# Patient Record
Sex: Female | Born: 1985 | Race: Black or African American | Hispanic: No | Marital: Single | Smoking: Current some day smoker
Health system: Southern US, Community
[De-identification: ages and names within clinical notes are randomized; demographics above are authoritative.]

## PROBLEM LIST (undated history)

## (undated) ENCOUNTER — Inpatient Hospital Stay (HOSPITAL_COMMUNITY): Payer: Self-pay

## (undated) DIAGNOSIS — A599 Trichomoniasis, unspecified: Secondary | ICD-10-CM

## (undated) DIAGNOSIS — F329 Major depressive disorder, single episode, unspecified: Secondary | ICD-10-CM

## (undated) DIAGNOSIS — F32A Depression, unspecified: Secondary | ICD-10-CM

## (undated) DIAGNOSIS — R768 Other specified abnormal immunological findings in serum: Secondary | ICD-10-CM

## (undated) DIAGNOSIS — K219 Gastro-esophageal reflux disease without esophagitis: Secondary | ICD-10-CM

## (undated) HISTORY — PX: WISDOM TOOTH EXTRACTION: SHX21

## (undated) HISTORY — DX: Other specified abnormal immunological findings in serum: R76.8

---
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
## 2006-10-07 ENCOUNTER — Emergency Department (HOSPITAL_COMMUNITY): Admission: EM | Admit: 2006-10-07 | Discharge: 2006-10-07 | Payer: Self-pay | Admitting: Emergency Medicine

## 2007-05-30 ENCOUNTER — Emergency Department (HOSPITAL_COMMUNITY): Admission: EM | Admit: 2007-05-30 | Discharge: 2007-05-30 | Payer: Self-pay | Admitting: Emergency Medicine

## 2007-07-10 ENCOUNTER — Inpatient Hospital Stay (HOSPITAL_COMMUNITY): Admission: AD | Admit: 2007-07-10 | Discharge: 2007-07-10 | Payer: Self-pay | Admitting: Obstetrics

## 2007-08-21 ENCOUNTER — Inpatient Hospital Stay (HOSPITAL_COMMUNITY): Admission: AD | Admit: 2007-08-21 | Discharge: 2007-08-23 | Payer: Self-pay | Admitting: Obstetrics

## 2009-01-04 ENCOUNTER — Inpatient Hospital Stay (HOSPITAL_COMMUNITY): Admission: RE | Admit: 2009-01-04 | Discharge: 2009-01-06 | Payer: Self-pay | Admitting: Obstetrics

## 2010-11-27 LAB — CBC
HCT: 33.1 % — ABNORMAL LOW (ref 36.0–46.0)
Hemoglobin: 10.5 g/dL — ABNORMAL LOW (ref 12.0–15.0)
MCHC: 34.2 g/dL (ref 30.0–36.0)
MCHC: 34.7 g/dL (ref 30.0–36.0)
MCV: 95.9 fL (ref 78.0–100.0)
Platelets: 199 10*3/uL (ref 150–400)
RBC: 3.16 MIL/uL — ABNORMAL LOW (ref 3.87–5.11)
RDW: 13.4 % (ref 11.5–15.5)
RDW: 13.8 % (ref 11.5–15.5)
WBC: 13.3 10*3/uL — ABNORMAL HIGH (ref 4.0–10.5)

## 2011-05-08 LAB — CBC
HCT: 32.3 — ABNORMAL LOW
Hemoglobin: 10 — ABNORMAL LOW
MCV: 91.9
RBC: 3.13 — ABNORMAL LOW
RBC: 3.51 — ABNORMAL LOW
RDW: 12.9
WBC: 14.1 — ABNORMAL HIGH

## 2011-05-08 LAB — RPR: RPR Ser Ql: NONREACTIVE

## 2011-06-17 ENCOUNTER — Emergency Department (HOSPITAL_COMMUNITY)
Admission: EM | Admit: 2011-06-17 | Discharge: 2011-06-17 | Disposition: A | Discharge status: Home / Self Care | Payer: Medicaid Other | Attending: Emergency Medicine | Admitting: Emergency Medicine

## 2011-06-17 ENCOUNTER — Emergency Department (HOSPITAL_COMMUNITY): Payer: Medicaid Other

## 2011-06-17 DIAGNOSIS — W540XXA Bitten by dog, initial encounter: Secondary | ICD-10-CM | POA: Insufficient documentation

## 2011-06-17 DIAGNOSIS — S81009A Unspecified open wound, unspecified knee, initial encounter: Secondary | ICD-10-CM | POA: Insufficient documentation

## 2011-06-20 ENCOUNTER — Inpatient Hospital Stay (INDEPENDENT_AMBULATORY_CARE_PROVIDER_SITE_OTHER)
Admission: RE | Admit: 2011-06-20 | Discharge: 2011-06-20 | Disposition: A | Discharge status: Home / Self Care | Payer: Medicaid Other | Source: Ambulatory Visit | Attending: Family Medicine | Admitting: Family Medicine

## 2011-06-20 DIAGNOSIS — Z23 Encounter for immunization: Secondary | ICD-10-CM

## 2011-06-24 ENCOUNTER — Encounter: Payer: Self-pay | Admitting: *Deleted

## 2011-06-24 ENCOUNTER — Emergency Department (INDEPENDENT_AMBULATORY_CARE_PROVIDER_SITE_OTHER)
Admission: EM | Admit: 2011-06-24 | Discharge: 2011-06-24 | Disposition: A | Discharge status: Home / Self Care | Payer: Medicaid Other | Source: Home / Self Care

## 2011-06-24 DIAGNOSIS — T148XXA Other injury of unspecified body region, initial encounter: Secondary | ICD-10-CM

## 2011-06-24 DIAGNOSIS — W540XXA Bitten by dog, initial encounter: Secondary | ICD-10-CM

## 2011-06-24 MED ORDER — CLINDAMYCIN HCL 300 MG PO CAPS: 300.0000 mg | ORAL_CAPSULE | Freq: Four times a day (QID) | ORAL | Status: AC

## 2011-06-24 MED ORDER — RABIES VACCINE, PCEC IM SUSR
INTRAMUSCULAR | Status: AC
  Administered 2011-06-24: 1 mL via INTRAMUSCULAR
  Filled 2011-06-24: qty 1

## 2011-06-24 MED ORDER — RABIES VACCINE, PCEC IM SUSR
1.0000 mL | Freq: Once | INTRAMUSCULAR | Status: AC
  Administered 2011-06-24: 1 mL via INTRAMUSCULAR

## 2011-06-24 NOTE — ED Notes (Signed)
Pt. Said she has missed taking her Augmentin for 3 days.  She took 2 yesterday and one today. I asked Dr. Juanetta Gosling to see the pt.

## 2011-06-24 NOTE — ED Provider Notes (Signed)
History     CSN: [id number] Arrival date & time: No admission date for patient encounter.   None     Chief Complaint  Patient presents with  . Rabies Injection    (Consider location/radiation/quality/duration/timing/severity/associated sxs/prior treatment) Patient is a 25 y.o. female presenting with animal bite. The history is provided by the patient.  Animal Bite  The incident occurred at home. There is an injury to the left lower leg.    History reviewed. No pertinent past medical history.  History reviewed. No pertinent past surgical history.  History reviewed. No pertinent family history.  History  Substance Use Topics  . Smoking status: Current Everyday Smoker -- 0.2 packs/day  . Smokeless tobacco: Not on file  . Alcohol Use: No    OB History    Grav Para Term Preterm Abortions TAB SAB Ect Mult Living                  Review of Systems  Constitutional: Negative.   HENT: Negative.   Eyes: Negative.   Respiratory: Negative.   Cardiovascular: Negative.   Gastrointestinal: Negative.   Genitourinary: Negative.   Skin: Positive for color change and wound.  Neurological: Negative.     Allergies  Review of patient's allergies indicates no known allergies.  Home Medications   Current Outpatient Rx  Name Route Sig Dispense Refill  . AMOXICILLIN-POT CLAVULANATE 875-125 MG PO TABS Oral Take 1 tablet by mouth 2 (two) times daily.        BP 118/68  Pulse 85  Temp(Src) 98.6 F (37 C) (Oral)  Resp 24  SpO2 100%  Physical Exam  Constitutional: She appears well-developed and well-nourished.  HENT:  Head: Normocephalic and atraumatic.  Eyes: EOM are normal. Pupils are equal, round, and reactive to light.  Skin: Laceration noted. There is erythema.          2cm x 2 cm open wound with stitches in place over lateral LEFT lower leg, with 5 cm diameter of surrounding erythema and induration    ED Course  Procedures (including critical care time)  Labs  Reviewed - No data to display No results found.   No diagnosis found.    MDM  Increased erythema around wound, worse from 2 days ago (per Nurse York); will add coverage        Richardo Priest, MD 06/24/11 1530

## 2011-06-27 ENCOUNTER — Encounter (HOSPITAL_COMMUNITY): Payer: Self-pay | Admitting: Emergency Medicine

## 2011-06-27 ENCOUNTER — Emergency Department (INDEPENDENT_AMBULATORY_CARE_PROVIDER_SITE_OTHER)
Admission: EM | Admit: 2011-06-27 | Discharge: 2011-06-27 | Disposition: A | Discharge status: Home / Self Care | Payer: Medicaid Other | Source: Home / Self Care | Attending: Family Medicine | Admitting: Family Medicine

## 2011-06-27 DIAGNOSIS — T148XXA Other injury of unspecified body region, initial encounter: Secondary | ICD-10-CM

## 2011-06-27 DIAGNOSIS — T148 Other injury of unspecified body region: Secondary | ICD-10-CM

## 2011-06-27 NOTE — ED Provider Notes (Signed)
History     CSN: [id number] Arrival date & time: 06/27/2011  2:31 PM   First MD Initiated Contact with Patient 06/27/11 1358      Chief Complaint  Patient presents with  . Wound Check    (Consider location/radiation/quality/duration/timing/severity/associated sxs/prior treatment) Patient is a 25 y.o. female presenting with wound check. The history is provided by the patient.  Wound Check  She was treated in the ED 5 to 10 days ago. Previous treatment in the ED includes laceration repair and wound cleansing or irrigation. Treatments since wound repair include oral antibiotics, regular soap and water washings and a wound recheck. There has been bloody discharge from the wound. The redness has not changed. The swelling has not changed. The pain has not changed.    History reviewed. No pertinent past medical history.  History reviewed. No pertinent past surgical history.  No family history on file.  History  Substance Use Topics  . Smoking status: Current Everyday Smoker -- 0.2 packs/day  . Smokeless tobacco: Not on file  . Alcohol Use: No    OB History    Grav Para Term Preterm Abortions TAB SAB Ect Mult Living                  Review of Systems  Constitutional: Negative.   HENT: Negative.   Eyes: Negative.   Respiratory: Negative.   Cardiovascular: Negative.   Musculoskeletal: Negative.   Skin: Positive for wound.    Allergies  Review of patient's allergies indicates no known allergies.  Home Medications   Current Outpatient Rx  Name Route Sig Dispense Refill  . AMOXICILLIN-POT CLAVULANATE 875-125 MG PO TABS Oral Take 1 tablet by mouth 2 (two) times daily.      Marland Kitchen CLINDAMYCIN HCL 300 MG PO CAPS Oral Take 1 capsule (300 mg total) by mouth every 6 (six) hours. 28 capsule 0    BP 130/77  Pulse 94  Temp(Src) 98.6 F (37 C) (Oral)  Resp 16  Physical Exam  Skin:       ED Course  Procedures (including critical care time)  Labs Reviewed - No data to  display No results found.   No diagnosis found.    MDM  Wound rechecked        Richardo Priest, MD 06/27/11 1635

## 2011-06-27 NOTE — ED Notes (Signed)
Pt comes in for f/u dog bite wound to left lower leg that occurred 06/24/11.pt has received tetanus shot here.c/o min drainage @ sututred incision and 2nd area wound intact with sutures and steri strips.pain with pressure and upon awaking feels like aching pain.denies numb/tingling.

## 2011-07-02 ENCOUNTER — Emergency Department (HOSPITAL_COMMUNITY)
Admission: EM | Admit: 2011-07-02 | Discharge: 2011-07-02 | Disposition: A | Discharge status: Home / Self Care | Payer: Medicaid Other | Source: Home / Self Care | Attending: Family Medicine | Admitting: Family Medicine

## 2011-07-02 ENCOUNTER — Encounter (HOSPITAL_COMMUNITY): Payer: Self-pay | Admitting: *Deleted

## 2011-07-02 MED ORDER — RABIES VACCINE, PCEC IM SUSR
INTRAMUSCULAR | Status: AC
  Filled 2011-07-02: qty 1

## 2011-07-02 MED ORDER — RABIES VACCINE, PCEC IM SUSR
1.0000 mL | Freq: Once | INTRAMUSCULAR | Status: AC
  Administered 2011-07-02: 1 mL via INTRAMUSCULAR

## 2011-07-02 NOTE — ED Notes (Signed)
Removed 3 sutures from lateral left leg ,also removed 1 stitch inner left leg

## 2011-07-02 NOTE — ED Notes (Signed)
Here for last rabies vaccine for dog bite L lower leg. States she needs her stitches taken out. No previous problems with injections.  Wound checked by Dr. Juanetta Gosling and he approved sutures to be removed.

## 2014-05-11 ENCOUNTER — Encounter (HOSPITAL_COMMUNITY): Payer: Self-pay

## 2014-05-11 ENCOUNTER — Inpatient Hospital Stay (HOSPITAL_COMMUNITY)
Admission: AD | Admit: 2014-05-11 | Discharge: 2014-05-11 | Disposition: A | Discharge status: Home / Self Care | Payer: Medicaid Other | Source: Ambulatory Visit | Attending: Family Medicine | Admitting: Family Medicine

## 2014-05-11 DIAGNOSIS — F17200 Nicotine dependence, unspecified, uncomplicated: Secondary | ICD-10-CM | POA: Diagnosis not present

## 2014-05-11 DIAGNOSIS — O98819 Other maternal infectious and parasitic diseases complicating pregnancy, unspecified trimester: Secondary | ICD-10-CM | POA: Diagnosis not present

## 2014-05-11 DIAGNOSIS — N888 Other specified noninflammatory disorders of cervix uteri: Secondary | ICD-10-CM

## 2014-05-11 DIAGNOSIS — A5901 Trichomonal vulvovaginitis: Secondary | ICD-10-CM | POA: Diagnosis not present

## 2014-05-11 DIAGNOSIS — N898 Other specified noninflammatory disorders of vagina: Secondary | ICD-10-CM | POA: Diagnosis present

## 2014-05-11 HISTORY — DX: Major depressive disorder, single episode, unspecified: F32.9

## 2014-05-11 HISTORY — DX: Depression, unspecified: F32.A

## 2014-05-11 LAB — URINE MICROSCOPIC-ADD ON

## 2014-05-11 LAB — URINALYSIS, ROUTINE W REFLEX MICROSCOPIC
BILIRUBIN URINE: NEGATIVE
Glucose, UA: NEGATIVE mg/dL
KETONES UR: NEGATIVE mg/dL
NITRITE: NEGATIVE
PH: 6 (ref 5.0–8.0)
PROTEIN: NEGATIVE mg/dL
Specific Gravity, Urine: 1.02 (ref 1.005–1.030)
Urobilinogen, UA: 0.2 mg/dL (ref 0.0–1.0)

## 2014-05-11 LAB — WET PREP, GENITAL
Clue Cells Wet Prep HPF POC: NONE SEEN
Yeast Wet Prep HPF POC: NONE SEEN

## 2014-05-11 LAB — HIV ANTIBODY (ROUTINE TESTING W REFLEX): HIV: NONREACTIVE

## 2014-05-11 LAB — ABO/RH: ABO/RH(D): O POS

## 2014-05-11 MED ORDER — METRONIDAZOLE 500 MG PO TABS
2000.0000 mg | ORAL_TABLET | Freq: Once | ORAL | Status: AC
  Administered 2014-05-11: 2000 mg via ORAL
  Filled 2014-05-11: qty 4

## 2014-05-11 NOTE — MAU Provider Note (Signed)
History     CSN: [id number]  Arrival date and time: 05/11/14 1452   First Provider Initiated Contact with Patient 05/11/14 1543      Chief Complaint  Patient presents with  . Possible Pregnancy  . Vaginal Discharge  . Abdominal Pain   HPI  [name] is a 28 y.o. female G5P4004 at [redacted]w[redacted]d who presents with vaginal discharge that is pink. The discharge has been going on for 4 days, she feels like the symptoms are getting worse.  She has not started prenatal care and plans to start with Dr. Gaynell Face next week.   OB History   Grav Para Term Preterm Abortions TAB SAB Ect Mult Living   0 0 0 0 0 0 4      Past Medical History  Diagnosis Date  . Depression     ok now    Past Surgical History  Procedure Laterality Date  . Wisdom tooth extraction      Family History  Problem Relation Age of Onset  . Hypertension Mother   . Cancer Maternal Aunt     breast  . Hearing loss Neg Hx     History  Substance Use Topics  . Smoking status: Current Every Day Smoker -- 0.25 packs/day for 10 years    Types: Cigarettes  . Smokeless tobacco: Never Used  . Alcohol Use: No    Allergies: No Known Allergies  No prescriptions prior to admission   No results found for this or any previous visit (from the past 48 hour(s)).  Results for orders placed during the hospital encounter of 05/11/14 (from the past 48 hour(s))  URINALYSIS, ROUTINE W REFLEX MICROSCOPIC     Status: Abnormal   Collection Time    05/11/14  3:10 PM      Result Value Ref Range   Color, Urine YELLOW  YELLOW   APPearance CLEAR  CLEAR   Specific Gravity, Urine 1.020  1.005 - 1.030   pH 6.0  5.0 - 8.0   Glucose, UA NEGATIVE  NEGATIVE mg/dL   Hgb urine dipstick LARGE (*) NEGATIVE   Bilirubin Urine NEGATIVE  NEGATIVE   Ketones, ur NEGATIVE  NEGATIVE mg/dL   Protein, ur NEGATIVE  NEGATIVE mg/dL   Urobilinogen, UA 0.2  0.0 - 1.0 mg/dL   Nitrite NEGATIVE  NEGATIVE   Leukocytes, UA LARGE (*) NEGATIVE   URINE MICROSCOPIC-ADD ON     Status: Abnormal   Collection Time    05/11/14  3:10 PM      Result Value Ref Range   Squamous Epithelial / LPF MANY (*) RARE   WBC, UA 7-10  <3 WBC/hpf   RBC / HPF 7-10  <3 RBC/hpf   Bacteria, UA MANY (*) RARE   Urine-Other TRICHOMONAS PRESENT    WET PREP, GENITAL     Status: Abnormal   Collection Time    05/11/14  3:55 PM      Result Value Ref Range   Yeast Wet Prep HPF POC NONE SEEN  NONE SEEN   Trich, Wet Prep FEW (*) NONE SEEN   Clue Cells Wet Prep HPF POC NONE SEEN  NONE SEEN   WBC, Wet Prep HPF POC MODERATE (*) NONE SEEN   Comment: MANY BACTERIA SEEN  ABO/RH     Status: None   Collection Time    05/11/14  4:12 PM      Result Value Ref Range   ABO/RH(D) O POS     Review of  Systems  Constitutional: Negative for fever and chills.  Gastrointestinal: Positive for abdominal pain (Lower abdomianl pain, sharp pain at times. ). Negative for nausea and vomiting.  Genitourinary: Negative for dysuria, urgency, frequency and hematuria.       No vaginal discharge; pink discharge  No vaginal bleeding. No dysuria.    Physical Exam   Blood pressure 117/53, pulse 100, temperature 99 F (37.2 C), temperature source Oral, resp. rate 16, height  (1.575 m), weight 73.029 kg (161 lb), last menstrual period 01/29/2014, SpO2 100.00%.  Physical Exam  Constitutional: She appears well-developed and well-nourished. No distress.  HENT:  Head: Normocephalic.  Eyes: Pupils are equal, round, and reactive to light.  Neck: Neck supple.  GI: Soft. Normal appearance. There is tenderness in the suprapubic area. There is no rigidity, no rebound and no guarding.  Genitourinary: Vaginal discharge found.  Speculum exam: Vagina - moderate amount of creamy, pink discharge, strong odor Cervix - + contact bleeding, cervix appears friable  Bimanual exam: Cervix closed, no CMT  Uterus non tender, enlarged  Adnexa non tender, no masses bilaterally, suprapubic tenderness   GC/Chlam, wet prep done Chaperone present for exam.   Musculoskeletal: Normal range of motion.  Neurological: She is alert.  Skin: Skin is warm. She is not diaphoretic.  Psychiatric: Her behavior is normal.    MAU Course  Procedures None  MDM +fht  Wet prep GC Patient has an appointment scheduled with Dr. Gaynell Face next week. Pt is encouraged to come back to MAU if symptoms worsen  Bleeding coming directly from friable cervix; +trichomonas  Assessment and Plan   A: 1. Friable cervix   2. Trichomonas vaginitis    P: Discharge home in stable condition  Patient treated in MAU for trichomonas; partner needs treatment and no intercourse for 7 days Bleeding precautions    Iona Hansen Dafney Farler, NP  05/11/2014, 3:45 PM

## 2014-05-11 NOTE — MAU Note (Signed)
Patient states she has had a positive home pregnancy test. States she has had a pinkish vaginal discharge and abdominal pain for a couple of days. Denies nausea or vomiting.

## 2014-05-11 NOTE — MAU Note (Signed)
Pink spotting started 3 days ago, still pink but seems to be getting worse.  Pain in lower abd starteda bout the same time

## 2014-05-12 LAB — CULTURE, OB URINE: SPECIAL REQUESTS: NORMAL

## 2014-05-12 LAB — GC/CHLAMYDIA PROBE AMP
CT PROBE, AMP APTIMA: NEGATIVE
GC PROBE AMP APTIMA: NEGATIVE

## 2014-05-12 NOTE — MAU Provider Note (Signed)
Attestation of Attending Supervision of Advanced Practitioner (PA/CNM/NP): Evaluation and management procedures were performed by the Advanced Practitioner under my supervision and collaboration.  I have reviewed the Advanced Practitioner's note and chart, and I agree with the management and plan.  Bonniejean Piano, DO Attending Physician Faculty Practice, [facility]  

## 2014-05-18 ENCOUNTER — Other Ambulatory Visit (HOSPITAL_COMMUNITY): Payer: Self-pay | Admitting: Obstetrics

## 2014-05-18 ENCOUNTER — Ambulatory Visit (HOSPITAL_COMMUNITY)
Admission: RE | Admit: 2014-05-18 | Discharge: 2014-05-18 | Disposition: A | Discharge status: Home / Self Care | Payer: Medicaid Other | Source: Ambulatory Visit | Attending: Obstetrics | Admitting: Obstetrics

## 2014-05-18 DIAGNOSIS — O368390 Maternal care for abnormalities of the fetal heart rate or rhythm, unspecified trimester, not applicable or unspecified: Secondary | ICD-10-CM | POA: Insufficient documentation

## 2014-05-18 DIAGNOSIS — O021 Missed abortion: Secondary | ICD-10-CM | POA: Diagnosis not present

## 2014-05-18 DIAGNOSIS — IMO0002 Reserved for concepts with insufficient information to code with codable children: Secondary | ICD-10-CM

## 2014-05-24 ENCOUNTER — Inpatient Hospital Stay (HOSPITAL_COMMUNITY)
Admission: AD | Admit: 2014-05-24 | Discharge: 2014-05-25 | DRG: 779 | Disposition: A | Discharge status: Home / Self Care | Payer: Medicaid Other | Source: Ambulatory Visit | Attending: Obstetrics | Admitting: Obstetrics

## 2014-05-24 ENCOUNTER — Encounter (HOSPITAL_COMMUNITY): Payer: Self-pay | Admitting: *Deleted

## 2014-05-24 DIAGNOSIS — O021 Missed abortion: Principal | ICD-10-CM | POA: Diagnosis present

## 2014-05-24 DIAGNOSIS — IMO0002 Reserved for concepts with insufficient information to code with codable children: Secondary | ICD-10-CM | POA: Diagnosis present

## 2014-05-24 LAB — TYPE AND SCREEN
ABO/RH(D): O POS
Antibody Screen: NEGATIVE

## 2014-05-24 LAB — CBC
HCT: 34.1 % — ABNORMAL LOW (ref 36.0–46.0)
HEMOGLOBIN: 11.5 g/dL — AB (ref 12.0–15.0)
MCH: 29.6 pg (ref 26.0–34.0)
MCHC: 33.7 g/dL (ref 30.0–36.0)
MCV: 87.7 fL (ref 78.0–100.0)
PLATELETS: 340 10*3/uL (ref 150–400)
RBC: 3.89 MIL/uL (ref 3.87–5.11)
RDW: 14.9 % (ref 11.5–15.5)
WBC: 10.1 10*3/uL (ref 4.0–10.5)

## 2014-05-24 LAB — MRSA PCR SCREENING: MRSA BY PCR: NEGATIVE

## 2014-05-24 LAB — HIV ANTIBODY (ROUTINE TESTING W REFLEX): HIV 1&2 Ab, 4th Generation: NONREACTIVE

## 2014-05-24 LAB — RPR

## 2014-05-24 LAB — HEPATITIS B SURFACE ANTIGEN: Hepatitis B Surface Ag: NEGATIVE

## 2014-05-24 MED ORDER — IBUPROFEN 600 MG PO TABS
600.0000 mg | ORAL_TABLET | Freq: Four times a day (QID) | ORAL | Status: DC
  Administered 2014-05-25 (×2): 600 mg via ORAL
  Filled 2014-05-24 (×3): qty 1

## 2014-05-24 MED ORDER — OXYCODONE-ACETAMINOPHEN 5-325 MG PO TABS: 2.0000 | ORAL_TABLET | ORAL | Status: DC | PRN

## 2014-05-24 MED ORDER — ONDANSETRON HCL 4 MG/2ML IJ SOLN
4.0000 mg | Freq: Four times a day (QID) | INTRAMUSCULAR | Status: DC | PRN
  Administered 2014-05-24: 4 mg via INTRAVENOUS
  Filled 2014-05-24: qty 2

## 2014-05-24 MED ORDER — BENZOCAINE-MENTHOL 20-0.5 % EX AERO: 1.0000 "application " | INHALATION_SPRAY | CUTANEOUS | Status: DC | PRN

## 2014-05-24 MED ORDER — ONDANSETRON HCL 4 MG/2ML IJ SOLN
4.0000 mg | INTRAMUSCULAR | Status: DC | PRN
  Administered 2014-05-24: 4 mg via INTRAVENOUS
  Filled 2014-05-24: qty 2

## 2014-05-24 MED ORDER — MISOPROSTOL 200 MCG PO TABS
ORAL_TABLET | ORAL | Status: AC
  Filled 2014-05-24: qty 3

## 2014-05-24 MED ORDER — SENNOSIDES-DOCUSATE SODIUM 8.6-50 MG PO TABS
2.0000 | ORAL_TABLET | ORAL | Status: DC
  Filled 2014-05-24: qty 2

## 2014-05-24 MED ORDER — TETANUS-DIPHTH-ACELL PERTUSSIS 5-2.5-18.5 LF-MCG/0.5 IM SUSP
0.5000 mL | Freq: Once | INTRAMUSCULAR | Status: DC
  Filled 2014-05-24: qty 0.5

## 2014-05-24 MED ORDER — LACTATED RINGERS IV SOLN
INTRAVENOUS | Status: DC
  Administered 2014-05-24 (×2): via INTRAVENOUS

## 2014-05-24 MED ORDER — ZOLPIDEM TARTRATE 5 MG PO TABS: 5.0000 mg | ORAL_TABLET | Freq: Every evening | ORAL | Status: DC | PRN

## 2014-05-24 MED ORDER — DIBUCAINE 1 % RE OINT: 1.0000 "application " | TOPICAL_OINTMENT | RECTAL | Status: DC | PRN

## 2014-05-24 MED ORDER — DIPHENHYDRAMINE HCL 50 MG/ML IJ SOLN: 12.5000 mg | Freq: Four times a day (QID) | INTRAMUSCULAR | Status: DC | PRN

## 2014-05-24 MED ORDER — LANOLIN HYDROUS EX OINT: TOPICAL_OINTMENT | CUTANEOUS | Status: DC | PRN

## 2014-05-24 MED ORDER — ONDANSETRON HCL 4 MG PO TABS
4.0000 mg | ORAL_TABLET | ORAL | Status: DC | PRN
Start: 2014-05-24 — End: 2014-05-25

## 2014-05-24 MED ORDER — DIPHENHYDRAMINE HCL 25 MG PO CAPS: 25.0000 mg | ORAL_CAPSULE | Freq: Four times a day (QID) | ORAL | Status: DC | PRN

## 2014-05-24 MED ORDER — HYDROMORPHONE 0.3 MG/ML IV SOLN
INTRAVENOUS | Status: DC
  Administered 2014-05-24: 12:00:00 via INTRAVENOUS
  Filled 2014-05-24: qty 25

## 2014-05-24 MED ORDER — PRENATAL MULTIVITAMIN CH: 1.0000 | ORAL_TABLET | Freq: Every day | ORAL | Status: DC

## 2014-05-24 MED ORDER — OXYCODONE-ACETAMINOPHEN 5-325 MG PO TABS: 1.0000 | ORAL_TABLET | ORAL | Status: DC | PRN

## 2014-05-24 MED ORDER — FERROUS SULFATE 325 (65 FE) MG PO TABS: 325.0000 mg | ORAL_TABLET | Freq: Two times a day (BID) | ORAL | Status: DC

## 2014-05-24 MED ORDER — NALOXONE HCL 0.4 MG/ML IJ SOLN: 0.4000 mg | INTRAMUSCULAR | Status: DC | PRN

## 2014-05-24 MED ORDER — SIMETHICONE 80 MG PO CHEW: 80.0000 mg | CHEWABLE_TABLET | ORAL | Status: DC | PRN

## 2014-05-24 MED ORDER — OXYTOCIN 40 UNITS IN LACTATED RINGERS INFUSION - SIMPLE MED
INTRAVENOUS | Status: AC
  Administered 2014-05-24: 40 [IU]
  Filled 2014-05-24: qty 1000

## 2014-05-24 MED ORDER — MISOPROSTOL 200 MCG PO TABS: 600.0000 ug | ORAL_TABLET | Freq: Four times a day (QID) | ORAL | Status: DC

## 2014-05-24 MED ORDER — SODIUM CHLORIDE 0.9 % IJ SOLN
9.0000 mL | INTRAMUSCULAR | Status: DC | PRN
Start: 2014-05-24 — End: 2014-05-24

## 2014-05-24 MED ORDER — MISOPROSTOL 200 MCG PO TABS
600.0000 ug | ORAL_TABLET | Freq: Once | ORAL | Status: AC
  Administered 2014-05-24: 600 ug via VAGINAL
  Filled 2014-05-24: qty 3

## 2014-05-24 MED ORDER — WITCH HAZEL-GLYCERIN EX PADS: 1.0000 "application " | MEDICATED_PAD | CUTANEOUS | Status: DC | PRN

## 2014-05-24 MED ORDER — DIPHENHYDRAMINE HCL 12.5 MG/5ML PO ELIX
12.5000 mg | ORAL_SOLUTION | Freq: Four times a day (QID) | ORAL | Status: DC | PRN
  Filled 2014-05-24: qty 5

## 2014-05-24 NOTE — Progress Notes (Signed)
UR chart review completed.  

## 2014-05-24 NOTE — Progress Notes (Signed)
Patient ID: Cindy Cruz, female   DOB: 12-15-1985, 28 y.o.   MRN: [id number] Patient  Passed about 300cc of clots and the small  female fetus patient was placed on a bedpan  Upside down  speculum inserted and using sponge forceps the placenta was removed intact her bleeding is now minimal second trimester AB because of a  Fetal demise at 15 weeks

## 2014-05-24 NOTE — Progress Notes (Signed)
1845:  Delivery of large clot and estimated 11-14 week fetus with no signs of life confirming Ultrasound findings from 9/30.

## 2014-05-24 NOTE — H&P (Signed)
This is Dr. Francoise CeoBernard Cruz dictating the history and physical on  Cindy Cruz she is a 28 year old gravida 5 para 4004 at 15+ weeks EDC 11/05/1957 who was seen in the obtuse on 9:30 start prenatal care her uterus was 16 weeks size there was no fetal heart and IUFD was confirmed by ultrasound she was seen yesterday and laminae are inserted and the cervix and today she was admitted Cindy Cruz removed from the cervix then Cytotec 600 mics was inserted in the vagina past medical history negative Past surgical history negative Social history negative System review negative Physical exam well-developed female in no distress  HEENT negative Breasts negative Heart regular rhythm no murmurs no gallops Lungs clear to P&A Abdomen uterus 16 weeks size Pelvic as described above Extremities negative

## 2014-05-24 NOTE — Progress Notes (Signed)
05/24/14 1300  Clinical Encounter Type  Visited With Patient  Visit Type Spiritual support;Social support  Referral From Nurse  Spiritual Encounters  Spiritual Needs Grief support;Emotional  Stress Factors  Patient Stress Factors Loss of control;Loss   Made initial visit to offer bereavement support.  Sheng was welcoming of pastoral presence and shared about her shock of learning of baby's death after having four healthy pregnancies.  She is grieving the loss of this much-wanted baby, even as she looks ahead toward trying again.  Visit interrupted by emesis, pt's desire for nap.  She is aware of ongoing chaplain availability.  Please note that chaplain may be traveling from off campus, so there may be a 20-minute lag time between phone and in-person responses to page.  Thank you.  [address] AvenueChaplain Solaris Kram Doe RunLundeen, [place]iv [phone]

## 2014-05-25 LAB — CBC
HCT: 25.9 % — ABNORMAL LOW (ref 36.0–46.0)
Hemoglobin: 8.6 g/dL — ABNORMAL LOW (ref 12.0–15.0)
MCH: 28.9 pg (ref 26.0–34.0)
MCHC: 33.2 g/dL (ref 30.0–36.0)
MCV: 86.9 fL (ref 78.0–100.0)
Platelets: 276 10*3/uL (ref 150–400)
RBC: 2.98 MIL/uL — ABNORMAL LOW (ref 3.87–5.11)
RDW: 14.5 % (ref 11.5–15.5)
WBC: 12.1 10*3/uL — ABNORMAL HIGH (ref 4.0–10.5)

## 2014-05-25 LAB — RUBELLA SCREEN: Rubella: 0.84 Index (ref <0.90)

## 2014-05-25 NOTE — Progress Notes (Signed)
UR chart review completed.  

## 2014-05-25 NOTE — Progress Notes (Signed)
PT given discharge instructions. PT verbalized understanding and had no further questions. PT discharged home @ 0850 in stable condition.

## 2014-05-25 NOTE — Discharge Summary (Signed)
  Patient is a 28 year old gravida 5 para 304004 who was seen last week at 15 weeks for her first prenatal visit that time was noted to have a fetal demise confirmed by ultrasound she was brought in yesterday had Cytotec placed    Spontaneous vaginal delivery of a macerated little  female and the placenta was removed from the uterus with sponge forceps she's done well him but discharge today to see me in 6 weeks

## 2014-05-25 NOTE — Progress Notes (Signed)
Patient ID: Cindy Cruz, female   DOB: Jun 21, 1986, 28 y.o.   MRN: [id number] Vital signs normal Fundus firm Lochia moderate Legs negative Home today on Percocet for pain

## 2014-05-25 NOTE — Discharge Instructions (Signed)
if  bleeding more than a period notify me  °No sex for 3 weeks °See me in 3 weeks  °Resume normal activity in am °

## 2014-06-20 ENCOUNTER — Encounter (HOSPITAL_COMMUNITY): Payer: Self-pay | Admitting: *Deleted

## 2014-08-09 ENCOUNTER — Emergency Department (INDEPENDENT_AMBULATORY_CARE_PROVIDER_SITE_OTHER)
Admission: EM | Admit: 2014-08-09 | Discharge: 2014-08-09 | Disposition: A | Discharge status: Home / Self Care | Payer: Medicaid Other | Source: Home / Self Care | Attending: Family Medicine | Admitting: Family Medicine

## 2014-08-09 ENCOUNTER — Other Ambulatory Visit (HOSPITAL_COMMUNITY)
Admission: RE | Admit: 2014-08-09 | Discharge: 2014-08-09 | Disposition: A | Discharge status: Home / Self Care | Payer: Medicaid Other | Source: Ambulatory Visit | Attending: Family Medicine | Admitting: Family Medicine

## 2014-08-09 ENCOUNTER — Encounter (HOSPITAL_COMMUNITY): Payer: Self-pay | Admitting: *Deleted

## 2014-08-09 DIAGNOSIS — N760 Acute vaginitis: Secondary | ICD-10-CM | POA: Insufficient documentation

## 2014-08-09 DIAGNOSIS — Z113 Encounter for screening for infections with a predominantly sexual mode of transmission: Secondary | ICD-10-CM | POA: Insufficient documentation

## 2014-08-09 LAB — POCT URINALYSIS DIP (DEVICE)
Bilirubin Urine: NEGATIVE
GLUCOSE, UA: NEGATIVE mg/dL
Hgb urine dipstick: NEGATIVE
KETONES UR: NEGATIVE mg/dL
Leukocytes, UA: NEGATIVE
Nitrite: NEGATIVE
Protein, ur: NEGATIVE mg/dL
Urobilinogen, UA: 0.2 mg/dL (ref 0.0–1.0)
pH: 5.5 (ref 5.0–8.0)

## 2014-08-09 LAB — POCT PREGNANCY, URINE: PREG TEST UR: NEGATIVE

## 2014-08-09 MED ORDER — METRONIDAZOLE 500 MG PO TABS: 1000.0000 mg | ORAL_TABLET | Freq: Two times a day (BID) | ORAL | Status: DC

## 2014-08-09 NOTE — ED Notes (Addendum)
Had unprotected sex with last partner.  Just wants to be checked to make sure she does not have anything.  [facility]ich.  She was treated and told her partner to get treated,but does not know if he did.  No symptoms.

## 2014-08-09 NOTE — ED Provider Notes (Signed)
CSN: [id number]     Arrival date & time 08/09/14  1640 History   First MD Initiated Contact with Patient 08/09/14 1727     Chief Complaint  Patient presents with  . Exposure to STD   (Consider location/radiation/quality/duration/timing/severity/associated sxs/prior Treatment) HPI            28 year old female presents requesting STD testing. She tested positive for trichomonas couple of months ago. She told her boyfriend to be tested and treated as well but she is not sure if he did. She has not experienced any new symptoms but she will just wants to check to make sure she has gotten rid of the infection. She denies any abdominal pain, discharge, dysuria, or any other symptoms.  Past Medical History  Diagnosis Date  . Depression     ok now   Past Surgical History  Procedure Laterality Date  . Wisdom tooth extraction     Family History  Problem Relation Age of Onset  . Hypertension Mother   . Cancer Maternal Aunt     breast  . Hearing loss Neg Hx    History  Substance Use Topics  . Smoking status: Current Every Day Smoker -- 1.00 packs/day for 10 years    Types: Cigarettes  . Smokeless tobacco: Never Used  . Alcohol Use: No   OB History    Gravida Para Term Preterm AB TAB SAB Ectopic Multiple Living   5 4 4  0 0 0 0 0 0 4     Review of Systems  Constitutional: Negative for fever and chills.  Eyes: Negative for visual disturbance.  Respiratory: Negative for cough and shortness of breath.   Cardiovascular: Negative for chest pain, palpitations and leg swelling.  Gastrointestinal: Negative for nausea, vomiting and abdominal pain.  Endocrine: Negative for polydipsia and polyuria.  Genitourinary: Negative for dysuria, urgency and frequency.  Musculoskeletal: Negative for myalgias and arthralgias.  Skin: Negative for rash.  Neurological: Negative for dizziness, weakness and light-headedness.  All other systems reviewed and are negative.   Allergies  Review of patient's  allergies indicates no known allergies.  Home Medications   Prior to Admission medications   Medication Sig Start Date End Date Taking? Authorizing Provider  Prenatal Multivit-Min-Fe-FA (PRENATAL VITAMINS PO) Take 1 tablet by mouth daily.   Yes Historical Provider, MD  metroNIDAZOLE (FLAGYL) 500 MG tablet Take 2 tablets (1,000 mg total) by mouth every 12 (twelve) hours. 08/09/14   Adrian BlackwaterZachary H Keyairra Kolinski, PA-C   BP 102/68 mmHg  Pulse 76  Temp(Src) 98.5 F (36.9 C) (Oral)  Resp 16  SpO2 98%  LMP 08/01/2014  Breastfeeding? Unknown Physical Exam  Constitutional: She is oriented to person, place, and time. Vital signs are normal. She appears well-developed and well-nourished. No distress.  HENT:  Head: Normocephalic and atraumatic.  Pulmonary/Chest: Effort normal. No respiratory distress.  Abdominal: Soft. She exhibits no mass. There is no tenderness.  Genitourinary: There is no lesion on the right labia. There is no lesion on the left labia. Cervix exhibits no discharge and no friability. No erythema or bleeding in the vagina. Vaginal discharge (thin, white) found.  Lymphadenopathy:       Right: No inguinal adenopathy present.       Left: No inguinal adenopathy present.  Neurological: She is alert and oriented to person, place, and time. She has normal strength. Coordination normal.  Skin: Skin is warm and dry. No rash noted. She is not diaphoretic.  Psychiatric: She has a normal mood and  affect. Judgment normal.  Nursing note and vitals reviewed.   ED Course  Procedures (including critical care time) Labs Review Labs Reviewed  POCT URINALYSIS DIP (DEVICE)  POCT PREGNANCY, URINE  CERVICOVAGINAL ANCILLARY ONLY    Imaging Review No results found.   MDM   1. Vaginitis    Vaginal cytology swabs obtained. I provided her a prescription for metronidazole, she will take it if we call her and tell her the test is positive.   Meds ordered this encounter  Medications  . Prenatal  Multivit-Min-Fe-FA (PRENATAL VITAMINS PO)    Sig: Take 1 tablet by mouth daily.  . metroNIDAZOLE (FLAGYL) 500 MG tablet    Sig: Take 2 tablets (1,000 mg total) by mouth every 12 (twelve) hours.    Dispense:  4 tablet    Refill:  0       Graylon GoodZachary H Avaleen Brownley, PA-C 08/09/14 [phone]

## 2014-08-09 NOTE — Discharge Instructions (Signed)
Vaginitis °Vaginitis is an inflammation of the vagina. It is most often caused by a change in the normal balance of the bacteria and yeast that live in the vagina. This change in balance causes an overgrowth of certain bacteria or yeast, which causes the inflammation. There are different types of vaginitis, but the most common types are: °· Bacterial vaginosis. °· Yeast infection (candidiasis). °· Trichomoniasis vaginitis. This is a sexually transmitted infection (STI). °· Viral vaginitis. °· Atropic vaginitis. °· Allergic vaginitis. °CAUSES  °The cause depends on the type of vaginitis. Vaginitis can be caused by: °· Bacteria (bacterial vaginosis). °· Yeast (yeast infection). °· A parasite (trichomoniasis vaginitis) °· A virus (viral vaginitis). °· Low hormone levels (atrophic vaginitis). Low hormone levels can occur during pregnancy, breastfeeding, or after menopause. °· Irritants, such as bubble baths, scented tampons, and feminine sprays (allergic vaginitis). °Other factors can change the normal balance of the yeast and bacteria that live in the vagina. These include: °· Antibiotic medicines. °· Poor hygiene. °· Diaphragms, vaginal sponges, spermicides, birth control pills, and intrauterine devices (IUD). °· Sexual intercourse. °· Infection. °· Uncontrolled diabetes. °· A weakened immune system. °SYMPTOMS  °Symptoms can vary depending on the cause of the vaginitis. Common symptoms include: °· Abnormal vaginal discharge. °· The discharge is white, gray, or yellow with bacterial vaginosis. °· The discharge is thick, white, and cheesy with a yeast infection. °· The discharge is frothy and yellow or greenish with trichomoniasis. °· A bad vaginal odor. °· The odor is fishy with bacterial vaginosis. °· Vaginal itching, pain, or swelling. °· Painful intercourse. °· Pain or burning when urinating. °Sometimes, there are no symptoms. °TREATMENT  °Treatment will vary depending on the type of infection.  °· Bacterial  vaginosis and trichomoniasis are often treated with antibiotic creams or pills. °· Yeast infections are often treated with antifungal medicines, such as vaginal creams or suppositories. °· Viral vaginitis has no cure, but symptoms can be treated with medicines that relieve discomfort. Your sexual partner should be treated as well. °· Atrophic vaginitis may be treated with an estrogen cream, pill, suppository, or vaginal ring. If vaginal dryness occurs, lubricants and moisturizing creams may help. You may be told to avoid scented soaps, sprays, or douches. °· Allergic vaginitis treatment involves quitting the use of the product that is causing the problem. Vaginal creams can be used to treat the symptoms. °HOME CARE INSTRUCTIONS  °· Take all medicines as directed by your caregiver. °· Keep your genital area clean and dry. Avoid soap and only rinse the area with water. °· Avoid douching. It can remove the healthy bacteria in the vagina. °· Do not use tampons or have sexual intercourse until your vaginitis has been treated. Use sanitary pads while you have vaginitis. °· Wipe from front to back. This avoids the spread of bacteria from the rectum to the vagina. °· Let air reach your genital area. °¨ Wear cotton underwear to decrease moisture buildup. °¨ Avoid wearing underwear while you sleep until your vaginitis is gone. °¨ Avoid tight pants and underwear or nylons without a cotton panel. °¨ Take off wet clothing (especially bathing suits) as soon as possible. °· Use mild, non-scented products. Avoid using irritants, such as: °¨ Scented feminine sprays. °¨ Fabric softeners. °¨ Scented detergents. °¨ Scented tampons. °¨ Scented soaps or bubble baths. °· Practice safe sex and use condoms. Condoms may prevent the spread of trichomoniasis and viral vaginitis. °SEEK MEDICAL CARE IF:  °· You have abdominal pain. °· You   have a fever or persistent symptoms for more than 2-3 days. °· You have a fever and your symptoms suddenly  get worse. °Document Released: 06/02/2007 Document Revised: 04/29/2012 Document Reviewed: 01/16/2012 °ExitCare® Patient Information ©2015 [employer]. This information is not intended to replace advice given to you by your health care provider. Make sure you discuss any questions you have with your health care provider. ° °Sexually Transmitted Disease °A sexually transmitted disease (STD) is a disease or infection that may be passed (transmitted) from person to person, usually during sexual activity. This may happen by way of saliva, semen, blood, vaginal mucus, or urine. Common STDs include:  °· Gonorrhea.   °· Chlamydia.   °· Syphilis.   °· HIV and AIDS.   °· Genital herpes.   °· Hepatitis B and C.   °· Trichomonas.   °· Human papillomavirus (HPV).   °· Pubic lice.   °· Scabies. °· Mites. °· Bacterial vaginosis. °WHAT ARE CAUSES OF STDs? °An STD may be caused by bacteria, a virus, or parasites. STDs are often transmitted during sexual activity if one person is infected. However, they may also be transmitted through nonsexual means. STDs may be transmitted after:  °· Sexual intercourse with an infected person.   °· Sharing sex toys with an infected person.   °· Sharing needles with an infected person or using unclean piercing or tattoo needles. °· Having intimate contact with the genitals, mouth, or rectal areas of an infected person.   °· Exposure to infected fluids during birth. °WHAT ARE THE SIGNS AND SYMPTOMS OF STDs? °Different STDs have different symptoms. Some people may not have any symptoms. If symptoms are present, they may include:  °· Painful or bloody urination.   °· Pain in the pelvis, abdomen, vagina, anus, throat, or eyes.   °· A skin rash, itching, or irritation. °· Growths, ulcerations, blisters, or sores in the genital and anal areas. °· Abnormal vaginal discharge with or without bad odor.   °· Penile discharge in men.   °· Fever.   °· Pain or bleeding during sexual intercourse.   °· Swollen  glands in the groin area.   °· Yellow skin and eyes (jaundice). This is seen with hepatitis.   °· Swollen testicles. °· Infertility. °· Sores and blisters in the mouth. °HOW ARE STDs DIAGNOSED? °To make a diagnosis, your health care provider may:  °· Take a medical history.   °· Perform a physical exam.   °· Take a sample of any discharge to examine. °· Swab the throat, cervix, opening to the penis, rectum, or vagina for testing. °· Test a sample of your first morning urine.   °· Perform blood tests.   °· Perform a Pap test, if this applies.   °· Perform a colposcopy.   °· Perform a laparoscopy.   °HOW ARE STDs TREATED? ° Treatment depends on the STD. Some STDs may be treated but not cured.  °· Chlamydia, gonorrhea, trichomonas, and syphilis can be cured with antibiotic medicine.   °· Genital herpes, hepatitis, and HIV can be treated, but not cured, with prescribed medicines. The medicines lessen symptoms.   °· Genital warts from HPV can be treated with medicine or by freezing, burning (electrocautery), or surgery. Warts may come back.   °· HPV cannot be cured with medicine or surgery. However, abnormal areas may be removed from the cervix, vagina, or vulva.   °· If your diagnosis is confirmed, your recent sexual partners need treatment. This is true even if they are symptom-free or have a negative culture or evaluation. They should not have sex until their health care providers say it is okay. °HOW CAN I REDUCE MY   RISK OF GETTING AN STD? °Take these steps to reduce your risk of getting an STD: °· Use latex condoms, dental dams, and water-soluble lubricants during sexual activity. Do not use petroleum jelly or oils. °· Avoid having multiple sex partners. °· Do not have sex with someone who has other sex partners. °· Do not have sex with anyone you do not know or who is at high risk for an STD. °· Avoid risky sex practices that can break your skin. °· Do not have sex if you have open sores on your mouth or  skin. °· Avoid drinking too much alcohol or taking illegal drugs. Alcohol and drugs can affect your judgment and put you in a vulnerable position. °· Avoid engaging in oral and anal sex acts. °· Get vaccinated for HPV and hepatitis. If you have not received these vaccines in the past, talk to your health care provider about whether one or both might be right for you.   °· If you are at risk of being infected with HIV, it is recommended that you take a prescription medicine daily to prevent HIV infection. This is called pre-exposure prophylaxis (PrEP). You are considered at risk if: °¨ You are a man who has sex with other men (MSM). °¨ You are a heterosexual man or woman and are sexually active with more than one partner. °¨ You take drugs by injection. °¨ You are sexually active with a partner who has HIV. °· Talk with your health care provider about whether you are at high risk of being infected with HIV. If you choose to begin PrEP, you should first be tested for HIV. You should then be tested every 3 months for as long as you are taking PrEP.   °WHAT SHOULD I DO IF I THINK I HAVE AN STD? °· See your health care provider.   °· Tell your sexual partner(s). They should be tested and treated for any STDs. °· Do not have sex until your health care provider says it is okay.  °WHEN SHOULD I GET IMMEDIATE MEDICAL CARE? °Contact your health care provider right away if:  °· You have severe abdominal pain. °· You are a man and notice swelling or pain in your testicles. °· You are a woman and notice swelling or pain in your vagina. °Document Released: 10/26/2002 Document Revised: 08/10/2013 Document Reviewed: 02/23/2013 °ExitCare® Patient Information ©2015 [employer]. This information is not intended to replace advice given to you by your health care provider. Make sure you discuss any questions you have with your health care provider. ° °

## 2014-08-10 LAB — CERVICOVAGINAL ANCILLARY ONLY
Chlamydia: NEGATIVE
Neisseria Gonorrhea: NEGATIVE

## 2014-08-11 LAB — CERVICOVAGINAL ANCILLARY ONLY
WET PREP (BD AFFIRM): NEGATIVE
WET PREP (BD AFFIRM): POSITIVE — AB
Wet Prep (BD Affirm): NEGATIVE

## 2014-08-11 NOTE — ED Notes (Signed)
GC/Chlamydia neg., Affirm: Candida and Trich neg., Gardnerella pos. Pt. adequately treated with Flagyl. Cindy Cruz, Chyanna Flock M 08/11/2014

## 2014-12-01 ENCOUNTER — Emergency Department (HOSPITAL_COMMUNITY)
Admission: EM | Admit: 2014-12-01 | Discharge: 2014-12-01 | Disposition: A | Discharge status: Home / Self Care | Payer: Medicaid Other | Attending: Emergency Medicine | Admitting: Emergency Medicine

## 2014-12-01 ENCOUNTER — Encounter (HOSPITAL_COMMUNITY): Payer: Self-pay

## 2014-12-01 DIAGNOSIS — L299 Pruritus, unspecified: Secondary | ICD-10-CM

## 2014-12-01 DIAGNOSIS — Z720 Tobacco use: Secondary | ICD-10-CM | POA: Diagnosis not present

## 2014-12-01 DIAGNOSIS — Z79899 Other long term (current) drug therapy: Secondary | ICD-10-CM | POA: Insufficient documentation

## 2014-12-01 DIAGNOSIS — Z8659 Personal history of other mental and behavioral disorders: Secondary | ICD-10-CM | POA: Diagnosis not present

## 2014-12-01 MED ORDER — DIPHENHYDRAMINE HCL 25 MG PO CAPS
25.0000 mg | ORAL_CAPSULE | Freq: Once | ORAL | Status: AC
  Administered 2014-12-01: 25 mg via ORAL
  Filled 2014-12-01: qty 1

## 2014-12-01 MED ORDER — PREDNISONE 20 MG PO TABS
60.0000 mg | ORAL_TABLET | Freq: Once | ORAL | Status: AC
  Administered 2014-12-01: 60 mg via ORAL
  Filled 2014-12-01: qty 3

## 2014-12-01 MED ORDER — PREDNISONE 10 MG PO TABS: 20.0000 mg | ORAL_TABLET | Freq: Every day | ORAL | Status: DC

## 2014-12-01 MED ORDER — DIPHENHYDRAMINE HCL 25 MG PO CAPS: 25.0000 mg | ORAL_CAPSULE | Freq: Four times a day (QID) | ORAL | Status: DC | PRN

## 2014-12-01 NOTE — ED Provider Notes (Signed)
CSN: [id number]     Arrival date & time 12/01/14  0145 History   First MD Initiated Contact with Patient 12/01/14 [id number]     Chief Complaint  Patient presents with  . Pruritis     (Consider location/radiation/quality/duration/timing/severity/associated sxs/prior Treatment) HPI Comments: New lotion several hours later generalized itching that has been persistent throughout the day.  HA snot taken any antihistamine medications    The history is provided by the patient.    Past Medical History  Diagnosis Date  . Depression     ok now   Past Surgical History  Procedure Laterality Date  . Wisdom tooth extraction     Family History  Problem Relation Age of Onset  . Hypertension Mother   . Cancer Maternal Aunt     breast  . Hearing loss Neg Hx    History  Substance Use Topics  . Smoking status: Current Every Day Smoker -- 1.00 packs/day for 10 years    Types: Cigarettes  . Smokeless tobacco: Never Used  . Alcohol Use: No   OB History    Gravida Para Term Preterm AB TAB SAB Ectopic Multiple Living   5 4 4  0 0 0 0 0 0 4     Review of Systems  Constitutional: Negative for fever.  HENT: Negative for drooling, facial swelling and trouble swallowing.   Respiratory: Negative for cough, shortness of breath and wheezing.   Skin: Positive for color change. Negative for rash.  All other systems reviewed and are negative.     Allergies  Review of patient's allergies indicates no known allergies.  Home Medications   Prior to Admission medications   Medication Sig Start Date End Date Taking? Authorizing Provider  metroNIDAZOLE (FLAGYL) 500 MG tablet Take 2 tablets (1,000 mg total) by mouth every 12 (twelve) hours. 08/09/14   Graylon GoodZachary H Baker, PA-C  Prenatal Multivit-Min-Fe-FA (PRENATAL VITAMINS PO) Take 1 tablet by mouth daily.    Historical Provider, MD   BP 114/72 mmHg  Pulse 86  Temp(Src) 98.2 F (36.8 C) (Oral)  Resp 18  Ht 5\' 5"  (1.651 m)  Wt 168 lb (76.204 kg)  BMI  27.96 kg/m2  SpO2 99%  LMP 01/29/2014 Physical Exam  Constitutional: She is oriented to person, place, and time. She appears well-developed and well-nourished.  HENT:  Head: Normocephalic.  Mouth/Throat: Oropharynx is clear and moist.  Eyes: Pupils are equal, round, and reactive to light.  Neck: Normal range of motion.  Cardiovascular: Normal rate.   Pulmonary/Chest: Effort normal and breath sounds normal. She has no wheezes.  Musculoskeletal: She exhibits no edema or tenderness.  Neurological: She is alert and oriented to person, place, and time.  Skin: Skin is warm and dry. No rash noted. There is erythema.  Vitals reviewed.   ED Course  Procedures (including critical care time) Labs Review Labs Reviewed - No data to display  Imaging Review No results found.   EKG Interpretation None     Patient has been instructed to stop using all topical products for at least 2 days and then try one at a time and a small area to see if she has a reaction MDM   Final diagnoses:  None         Earley FavorGail Semaje Kinker, NP 12/01/14 0322  April Palumbo, MD 12/01/14 [id number]

## 2014-12-01 NOTE — Discharge Instructions (Signed)
Pruritus  °Pruritus is an itch. There are many different problems that can cause an itch. Dry skin is one of the most common causes of itching. Most cases of itching do not require medical attention.  °HOME CARE INSTRUCTIONS  °Make sure your skin is moistened on a regular basis. A moisturizer that contains petroleum jelly is best for keeping moisture in your skin. If you develop a rash, you may try the following for relief:  °· Use corticosteroid cream. °· Apply cool compresses to the affected areas. °· Bathe with Epsom salts or baking soda in the bathwater. °· Soak in colloidal oatmeal baths. These are available at your pharmacy. °· Apply baking soda paste to the rash. Stir water into baking soda until it reaches a paste-like consistency. °· Use an anti-itch lotion. °· Take over-the-counter diphenhydramine medicine by mouth as the instructions direct. °· Avoid scratching. Scratching may cause the rash to become infected. If itching is very bad, your caregiver may suggest prescription lotions or creams to lessen your symptoms. °· Avoid hot showers, which can make itching worse. A cold shower may help with itching as long as you use a moisturizer after the shower. °SEEK MEDICAL CARE IF: °The itching does not go away after several days. °Document Released: 04/17/2011 Document Revised: 12/20/2013 Document Reviewed: 04/17/2011 °ExitCare® Patient Information ©2015 [employer]. This information is not intended to replace advice given to you by your health care provider. Make sure you discuss any questions you have with your health care provider. ° °

## 2014-12-01 NOTE — ED Notes (Signed)
Pt complains of generalized itching with no obvious bumps, she states that she started using a fragrant [place] Secret lotion but no reaction to those before

## 2015-02-10 ENCOUNTER — Encounter (HOSPITAL_COMMUNITY): Payer: Self-pay | Admitting: Advanced Practice Midwife

## 2015-02-10 ENCOUNTER — Inpatient Hospital Stay (HOSPITAL_COMMUNITY)
Admission: AD | Admit: 2015-02-10 | Discharge: 2015-02-10 | Disposition: A | Discharge status: Home / Self Care | Payer: Medicaid Other | Source: Ambulatory Visit | Attending: Obstetrics | Admitting: Obstetrics

## 2015-02-10 DIAGNOSIS — Z3200 Encounter for pregnancy test, result unknown: Secondary | ICD-10-CM | POA: Diagnosis present

## 2015-02-10 DIAGNOSIS — R102 Pelvic and perineal pain: Secondary | ICD-10-CM | POA: Diagnosis not present

## 2015-02-10 DIAGNOSIS — Z3202 Encounter for pregnancy test, result negative: Secondary | ICD-10-CM | POA: Insufficient documentation

## 2015-02-10 LAB — CBC
HCT: 35.7 % — ABNORMAL LOW (ref 36.0–46.0)
HEMOGLOBIN: 12 g/dL (ref 12.0–15.0)
MCH: 29.1 pg (ref 26.0–34.0)
MCHC: 33.6 g/dL (ref 30.0–36.0)
MCV: 86.7 fL (ref 78.0–100.0)
Platelets: 322 10*3/uL (ref 150–400)
RBC: 4.12 MIL/uL (ref 3.87–5.11)
RDW: 14.2 % (ref 11.5–15.5)
WBC: 9 10*3/uL (ref 4.0–10.5)

## 2015-02-10 LAB — POCT PREGNANCY, URINE: Preg Test, Ur: NEGATIVE

## 2015-02-10 LAB — URINALYSIS, ROUTINE W REFLEX MICROSCOPIC
BILIRUBIN URINE: NEGATIVE
Glucose, UA: NEGATIVE mg/dL
Hgb urine dipstick: NEGATIVE
Ketones, ur: NEGATIVE mg/dL
Leukocytes, UA: NEGATIVE
Nitrite: NEGATIVE
PH: 6 (ref 5.0–8.0)
Protein, ur: NEGATIVE mg/dL
Specific Gravity, Urine: >1.03 — ABNORMAL HIGH (ref 1.005–1.030)
UROBILINOGEN UA: 0.2 mg/dL (ref 0.0–1.0)

## 2015-02-10 LAB — WET PREP, GENITAL
Trich, Wet Prep: NONE SEEN
YEAST WET PREP: NONE SEEN

## 2015-02-10 LAB — HCG, QUANTITATIVE, PREGNANCY: hCG, Beta Chain, Quant, S: <1 m[IU]/mL (ref <5)

## 2015-02-10 NOTE — Progress Notes (Signed)
Written and verbal d/c instructions given by Leafy Ro RNC and understanding voiced.

## 2015-02-10 NOTE — MAU Note (Signed)
Took pregnancy test 3 days ago and was positive. Just had a feeling to take the test. Has not missed a period yet. Had IUFD in OCT at 6mos.

## 2015-02-10 NOTE — MAU Provider Note (Signed)
History     CSN: [id number]  Arrival date and time: 02/10/15 1919   First Provider Initiated Contact with Patient 02/10/15 2048      Chief Complaint  Patient presents with  . Possible Pregnancy   HPI This is a 29 y.o. female who presents with request for pregnancy test. States took a pregnancy test 3 days ago and it was positive.  Has not missed a period yet, it is due in about a week. Has no complaints of pain or bleeding. Has no other somatic complaints. Is worried because she lost a 16 week baby in October. Does want to be tested for STDs.  States has some intermittent pelvic cramping every now and then   RN Note: Took pregnancy test 3 days ago and was positive. Just had a feeling to take the test. Has not missed a period yet. Had IUFD in OCT at 4mos.         OB History    Gravida Para Term Preterm AB TAB SAB Ectopic Multiple Living   0 1 0 0 0 0 4      Past Medical History  Diagnosis Date  . Depression     ok now    Past Surgical History  Procedure Laterality Date  . Wisdom tooth extraction      Family History  Problem Relation Age of Onset  . Hypertension Mother   . Cancer Maternal Aunt     breast  . Hearing loss Neg Hx     History  Substance Use Topics  . Smoking status: Current Every Day Smoker -- 1.00 packs/day for 10 years    Types: Cigarettes  . Smokeless tobacco: Never Used  . Alcohol Use: Yes     Comment: socially on occ.-liquor    Allergies: No Known Allergies  Prescriptions prior to admission  Medication Sig Dispense Refill Last Dose  . diphenhydrAMINE (BENADRYL) 25 mg capsule Take 1 capsule (25 mg total) by mouth every 6 (six) hours as needed. 30 capsule 0 prn at prn  . diphenhydramine-acetaminophen (TYLENOL PM) 25-500 MG TABS Take 1 tablet by mouth at bedtime as needed (sleep).   prn at prn  . Prenatal Multivit-Min-Fe-FA (PRENATAL VITAMINS PO) Take 1 tablet by mouth daily.   two weeks  . metroNIDAZOLE (FLAGYL) 500 MG tablet Take  2 tablets (1,000 mg total) by mouth every 12 (twelve) hours. (Patient not taking: Reported on 12/01/2014) 4 tablet 0 Completed Course at Unknown time  . predniSONE (DELTASONE) 10 MG tablet Take 2 tablets (20 mg total) by mouth daily. (Patient not taking: Reported on 02/10/2015) 10 tablet 0 Not Taking at Unknown time    Review of Systems  Constitutional: Negative for fever, chills and malaise/fatigue.  Gastrointestinal: Negative for nausea, vomiting and abdominal pain.  Genitourinary: Negative for dysuria.  Musculoskeletal: Negative for myalgias.  Neurological: Negative for dizziness, weakness and headaches.   Physical Exam   Blood pressure 126/82, pulse 88, temperature 98.3 F (36.8 C), resp. rate 18, height  (1.651 m), weight 167 lb 6.4 oz (75.932 kg), last menstrual period 01/19/2015, unknown if currently breastfeeding.  Physical Exam  Constitutional: She is oriented to person, place, and time. She appears well-developed and well-nourished. No distress.  HENT:  Head: Normocephalic.  Cardiovascular: Normal rate and regular rhythm.   Respiratory: Effort normal. No respiratory distress.  GI: Soft. Bowel sounds are normal. She exhibits no distension. There is no tenderness. There is no rebound and no guarding.  Genitourinary:  Vaginal discharge (thin white) found.  Cervix long and closed Uterus very small, firm, nontender  Musculoskeletal: Normal range of motion. She exhibits no edema.  Neurological: She is alert and oriented to person, place, and time.  Skin: Skin is warm and dry.  Psychiatric: She has a normal mood and affect.    MAU Course  Procedures  MDM Since patient reported + UPT at home and we had a negative UPT here, a full workup was done, as per protocol, to rule out abnormal pregnancy or ectopic.   Pelvic exam and blood testing were done. STD tests sent.   Results for orders placed or performed during the hospital encounter of 02/10/15 (from the past 72 hour(s))   Urinalysis, Routine w reflex microscopic (not at [facility])     Status: Abnormal   Collection Time: 02/10/15  7:37 PM  Result Value Ref Range   Color, Urine YELLOW YELLOW   APPearance CLEAR CLEAR   Specific Gravity, Urine >1.030 (H) 1.005 - 1.030   pH 6.0 5.0 - 8.0   Glucose, UA NEGATIVE NEGATIVE mg/dL   Hgb urine dipstick NEGATIVE NEGATIVE   Bilirubin Urine NEGATIVE NEGATIVE   Ketones, ur NEGATIVE NEGATIVE mg/dL   Protein, ur NEGATIVE NEGATIVE mg/dL   Urobilinogen, UA 0.2 0.0 - 1.0 mg/dL   Nitrite NEGATIVE NEGATIVE   Leukocytes, UA NEGATIVE NEGATIVE    Comment: MICROSCOPIC NOT DONE ON URINES WITH NEGATIVE PROTEIN, BLOOD, LEUKOCYTES, NITRITE, OR GLUCOSE <1000 mg/dL.  Pregnancy, urine POC     Status: None   Collection Time: 02/10/15  7:48 PM  Result Value Ref Range   Preg Test, Ur NEGATIVE NEGATIVE    Comment:        THE SENSITIVITY OF THIS METHODOLOGY IS >24 mIU/mL   hCG, quantitative, pregnancy     Status: None   Collection Time: 02/10/15  8:30 PM  Result Value Ref Range   hCG, Beta Chain, Quant, S <1 <5 mIU/mL    Comment:          GEST. AGE      CONC.  (mIU/mL)   <=1 WEEK        5 - 50     2 WEEKS       50 - 500     3 WEEKS       100 - 10,000     4 WEEKS     1,000 - 30,000     5 WEEKS     3,500 - 115,000   6-8 WEEKS     12,000 - 270,000    12 WEEKS     15,000 - 220,000        FEMALE AND NON-PREGNANT FEMALE:     LESS THAN 5 mIU/mL REPEATED TO VERIFY   CBC     Status: Abnormal   Collection Time: 02/10/15  8:30 PM  Result Value Ref Range   WBC 9.0 4.0 - 10.5 K/uL   RBC 4.12 3.87 - 5.11 MIL/uL   Hemoglobin 12.0 12.0 - 15.0 g/dL   HCT 76.2 (L) 83.1 - 51.7 %   MCV 86.7 78.0 - 100.0 fL   MCH 29.1 26.0 - 34.0 pg   MCHC 33.6 30.0 - 36.0 g/dL   RDW 61.6 07.3 - 71.0 %   Platelets 322 150 - 400 K/uL  Wet prep, genital     Status: Abnormal   Collection Time: 02/10/15  8:43 PM  Result Value Ref Range   Yeast Wet Prep HPF POC NONE SEEN  NONE SEEN   Trich, Wet Prep NONE SEEN  NONE SEEN   Clue Cells Wet Prep HPF POC FEW (A) NONE SEEN   WBC, Wet Prep HPF POC FEW (A) NONE SEEN    Comment: MODERATE BACTERIA SEEN   Discussed negative serum pregnancy test result. Discussed this could mean either she never was pregnancy and the test was a false-positive, OR that she was pregnant and had a very early miscarriage. Discussed timing of how fast Quant HCG would drop, related to LMP and lack of missed period yet. Probably less than [redacted] weeks pregnant.  After this long discussion, patient states she believes the "pregnancy test" she took at home was actually an Ovulation Test.  This would explain why it was positive, since she would have ovulated last week.   Assessment and Plan  A:  Mistaken positive pregnancy test, patient actually used an ovulation test      Not pregnant       History of 16 week fetal demise       Perceived risk for STDs       Intermittent pelvic cramping, possibly dysmenorrhea or ovulation pain  P:  Discussed results as above       Pelvic exam with cultures for GC and chlamydia sent       Long discussion of Ovulation tests vs pregnancy test. Advised to read instructions carefully       Will notify of results of cultures if they are positive. I do not feel she has any symptoms or exam which indicate the need to treat anything presumptively at this time       Advised to followup with OB/GYN for ongoing care and preconception planning.         Wynelle Bourgeois 02/10/2015, 9:18 PM

## 2015-02-10 NOTE — Discharge Instructions (Signed)
Preparing for Pregnancy Before trying to become pregnant, make an appointment with your health care provider (preconception care). The goal is to help you have a healthy, safe pregnancy. At your first appointment, your health care provider will:   Do a complete physical exam, including a Pap test.  Take a complete medical history.  Give you advice and help you resolve any problems. PRECONCEPTION CHECKLIST Here is a list of the basics to cover with your health care provider at your preconception visit:  Medical history.  Tell your health care provider about any diseases you have had. Many diseases can affect your pregnancy.  Include your partner's medical history and family history.  Make sure you have been tested for sexually transmitted infections (STIs). These can affect your pregnancy. In some cases, they can be passed to your baby. Tell your health care provider about any history of STIs.  Make sure your health care provider knows about any previous problems you have had with conception or pregnancy.  Tell your health care provider about any medicine you take. This includes herbal supplements and over-the-counter medicines.  Make sure all your immunizations are up to date. You may need to make additional appointments.  Ask your health care provider if you need any vaccinations or if there are any you should avoid.  Diet.  It is especially important to eat a healthy, balanced diet with the right nutrients when you are pregnant.  Ask your health care provider to help you get to a healthy weight before pregnancy.  If you are overweight, you are at higher risk for certain complications. These include high blood pressure, diabetes, and preterm birth.  If you are underweight, you are more likely to have a low-birth-weight baby.  Lifestyle.  Tell your health care provider about lifestyle factors such as alcohol use, drug use, or smoking.  Describe any harmful substances you may  be exposed to at work or home. These can include chemicals, pesticides, and radiation.  Mental health.  Let your health care provider know if you have been feeling depressed or anxious.  Let your health care provider know if you have a history of substance abuse.  Let your health care provider know if you do not feel safe at home. HOME INSTRUCTIONS TO PREPARE FOR PREGNANCY Follow your health care provider's advice and instructions.   Keep an accurate record of your menstrual periods. This makes it easier for your health care provider to determine your baby's due date.  Begin taking prenatal vitamins and folic acid supplements daily. Take them as directed by your health care provider.  Eat a balanced diet. Get help from a nutrition counselor if you have questions or need help.  Get regular exercise. Try to be active for at least 30 minutes a day most days of the week.  Quit smoking, if you smoke.  Do not drink alcohol.  Do not take illegal drugs.  Get medical problems, such as diabetes or high blood pressure, under control.  If you have diabetes, make sure you do the following:  Have good blood sugar control. If you have type 1 diabetes, use multiple daily doses of insulin. Do not use split-dose or premixed insulin.  Have an eye exam by a qualified eye care professional trained in caring for people with diabetes.  Get evaluated by your health care provider for cardiovascular disease.  Get to a healthy weight. If you are overweight or obese, reduce your weight with the help of a qualified health  professional such as a Museum/gallery exhibitions officer. Ask your health care provider what the right weight range is for you. HOW DO I KNOW I AM PREGNANT? You may be pregnant if you have been sexually active and you miss your period. Symptoms of early pregnancy include:   Mild cramping.  Very light vaginal bleeding (spotting).  Feeling unusually tired.  Morning sickness. If you have any of  these symptoms, take a home pregnancy test. These tests look for a hormone called human chorionic gonadotropin (hCG) in your urine. Your body begins to make this hormone during early pregnancy. These tests are very accurate. Wait until at least the first day you miss your period to take one. If you get a positive result, call your health care provider to make appointments for prenatal care. WHAT SHOULD I DO IF I BECOME PREGNANT?  Make an appointment with your health care provider by week 12 of your pregnancy at the latest.  Do not smoke. Smoking can be harmful to your baby.  Do not drink alcoholic beverages. Alcohol is related to a number of birth defects.  Avoid toxic odors and chemicals.  You may continue to have sexual intercourse if it does not cause pain or other problems, such as vaginal bleeding.   Document Released: 07/18/2008 Document Revised: 12/20/2013 Document Reviewed: 07/12/2013 [facility] Patient Information 2015 [place], [place]. This information is not intended to replace advice given to you by your health care provider. Make sure you discuss any questions you have with your health care provider.   Menstruation Menstruation is the monthly passing of blood, tissue, fluid and mucus, also know as a period. Your body is shedding the lining of the uterus. The flow, or amount of blood, usually lasts from 3-7 days each month. Hormones control the menstrual cycle. Hormones are a chemical substance produced by endocrine glands in the body to regulate different bodily functions. The first menstrual period may start any time between age 20 years to 16 years. However, it usually starts around age 33 years. Some girls have regular monthly menstrual cycles right from the beginning. However, it is not unusual to have only a couple of drops of blood or spotting when you first start menstruating. It is also not unusual to have two periods a month or miss a month or two when first starting your  periods. SYMPTOMS   Mild to moderate abdominal cramps.  Aching or pain in the lower back area. Symptoms may occur 5-10 days before your menstrual period starts. These symptoms are referred to as premenstrual syndrome (PMS). These symptoms can include:  Headache.  Breast tenderness and swelling.  Bloating.  Tiredness (fatigue).  Mood changes.  Craving for certain foods. These are normal signs and symptoms and can vary in severity. To help relieve these problems, ask your caregiver if you can take over-the-counter medications for pain or discomfort. If the symptoms are not controllable, see your caregiver for help.  HORMONES INVOLVED IN MENSTRUATION Menstruation comes about because of hormones produced by the pituitary gland in the brain and the ovaries that affect the uterine lining. First, the pituitary gland in the brain produces the hormone follicle stimulating hormone [facility]). FSH stimulates the ovaries to produce estrogen, which thickens the uterine lining and begins to develop an egg in the ovary. About 14 days later, the pituitary gland produces another hormone called luteinizing hormone (LH). LH causes the egg to come out of a sac in the ovary (ovulation). The empty sac on the ovary called the  corpus luteum is stimulated by another hormone from the pituitary gland called luteotropin. The corpus luteum begins to produce the estrogen and progesterone hormone. The progesterone hormone prepares the lining of the uterus to have the fertilized egg (egg combined with sperm) attach to the lining of the uterus and begin to develop into a fetus. If the egg is not fertilized, the corpus luteum stops producing estrogen and progesterone, it disappears, the lining of the uterus sloughs off and a menstrual period begins. Then the menstrual cycle starts all over again and will continue monthly unless pregnancy occurs or menopause begins. The secretion of hormones is complex. Various parts of the body  become involved in many chemical activities. Female sex hormones have other functions in a woman's body as well. Estrogen increases a woman's sex drive (libido). It naturally helps body get rid of fluids (diuretic). It also aids in the process of building new bone. Therefore, maintaining hormonal health is essential to all levels of a woman's well being. These hormones are usually present in normal amounts and cause you to menstruate. It is the relationship between the (small) levels of the hormones that is critical. When the balance is upset, menstrual irregularities can occur. HOW DOES THE MENSTRUAL CYCLE HAPPEN?  Menstrual cycles vary in length from 21-35 days with an average of 29 days. The cycle begins on the first day of bleeding. At this time, the pituitary gland in the brain releases FSH that travels through the bloodstream to the ovaries. [facility] stimulates the follicles in the ovaries. This prepares the body for ovulation that occurs around the 14th day of the cycle. The ovaries produce estrogen, and this makes sure conditions are right in the uterus for implantation of the fertilized egg.  When the levels of estrogen reach a high enough level, it signals the gland in the brain (pituitary gland) to release a surge of LH. This causes the release of the ripest egg from its follicle (ovulation). Usually only one follicle releases one egg, but sometimes more than one follicle releases an egg especially when stimulating the ovaries for in vitro fertilization. The egg can then be collected by either fallopian tube to await fertilization. The burst follicle within the ovary that is left behind is now called the corpus luteum or "yellow body." The corpus luteum continues to give off (secrete) reduced amounts of estrogen. This closes and hardens the cervix. It dries up the mucus to the naturally infertile condition.  The corpus luteum also begins to give off greater amounts of progesterone. This causes the  lining of the uterus (endometrium) to thicken even more in preparation for the fertilized egg. The egg is starting to journey down from the fallopian tube to the uterus. It also signals the ovaries to stop releasing eggs. It assists in returning the cervical mucus to its infertile state.  If the egg implants successfully into the womb lining and pregnancy occurs, progesterone levels will continue to raise. It is often this hormone that gives some pregnant women a feeling of well being, like a "natural high." Progesterone levels drop again after childbirth.  If fertilization does not occur, the corpus luteum dies, stopping the production of hormones. This sudden drop in progesterone causes the uterine lining to break down, accompanied by blood (menstruation).  This starts the cycle back at day 1. The whole process starts all over again. Woman go through this cycle every month from puberty to menopause. Women have breaks only for pregnancy and breastfeeding (lactation),  unless the woman has health problems that affect the female hormone system or chooses to use oral contraceptives to have unnatural menstrual periods. HOME CARE INSTRUCTIONS   Keep track of your periods by using a calendar.  If you use tampons, get the least absorbent to avoid toxic shock syndrome.  Do not leave tampons in the vagina over night or longer than 6 hours.  Wear a sanitary pad over night.  Exercise 3-5 times a week or more.  Avoid foods and drinks that you know will make your symptoms worse before or during your period. SEEK MEDICAL CARE IF:   You develop a fever with your period.  Your periods are lasting more than 7 days.  Your period is so heavy that you have to change pads or tampons every 30 minutes.  You develop clots with your period and never had clots before.  You cannot get relief from over-the-counter medication for your symptoms.  Your period has not started, and it has been longer than 35  days.   Document Released: 07/26/2002 Document Revised: 08/10/2013 Document Reviewed: 03/04/2013 [facility] Patient Information 2015 [place], [place]. This information is not intended to replace advice given to you by your health care provider. Make sure you discuss any questions you have with your health care provider.

## 2015-02-13 LAB — GC/CHLAMYDIA PROBE AMP (~~LOC~~) NOT AT ARMC
Chlamydia: NEGATIVE
Neisseria Gonorrhea: NEGATIVE

## 2015-12-15 ENCOUNTER — Encounter (HOSPITAL_COMMUNITY): Payer: Self-pay | Admitting: *Deleted

## 2015-12-15 ENCOUNTER — Inpatient Hospital Stay (HOSPITAL_COMMUNITY): Payer: Medicaid Other

## 2015-12-15 ENCOUNTER — Inpatient Hospital Stay (HOSPITAL_COMMUNITY)
Admission: AD | Admit: 2015-12-15 | Discharge: 2015-12-15 | Disposition: A | Discharge status: Home / Self Care | Payer: Medicaid Other | Source: Ambulatory Visit | Attending: Obstetrics & Gynecology | Admitting: Obstetrics & Gynecology

## 2015-12-15 DIAGNOSIS — O418X10 Other specified disorders of amniotic fluid and membranes, first trimester, not applicable or unspecified: Secondary | ICD-10-CM

## 2015-12-15 DIAGNOSIS — O98311 Other infections with a predominantly sexual mode of transmission complicating pregnancy, first trimester: Secondary | ICD-10-CM | POA: Diagnosis not present

## 2015-12-15 DIAGNOSIS — R109 Unspecified abdominal pain: Secondary | ICD-10-CM | POA: Diagnosis present

## 2015-12-15 DIAGNOSIS — A5901 Trichomonal vulvovaginitis: Secondary | ICD-10-CM | POA: Insufficient documentation

## 2015-12-15 DIAGNOSIS — O26899 Other specified pregnancy related conditions, unspecified trimester: Secondary | ICD-10-CM

## 2015-12-15 DIAGNOSIS — A599 Trichomoniasis, unspecified: Secondary | ICD-10-CM

## 2015-12-15 DIAGNOSIS — O26891 Other specified pregnancy related conditions, first trimester: Secondary | ICD-10-CM | POA: Diagnosis not present

## 2015-12-15 DIAGNOSIS — O468X1 Other antepartum hemorrhage, first trimester: Secondary | ICD-10-CM

## 2015-12-15 DIAGNOSIS — Z3A01 Less than 8 weeks gestation of pregnancy: Secondary | ICD-10-CM | POA: Diagnosis not present

## 2015-12-15 DIAGNOSIS — B9689 Other specified bacterial agents as the cause of diseases classified elsewhere: Secondary | ICD-10-CM | POA: Insufficient documentation

## 2015-12-15 DIAGNOSIS — F17210 Nicotine dependence, cigarettes, uncomplicated: Secondary | ICD-10-CM | POA: Diagnosis not present

## 2015-12-15 DIAGNOSIS — O99331 Smoking (tobacco) complicating pregnancy, first trimester: Secondary | ICD-10-CM | POA: Insufficient documentation

## 2015-12-15 DIAGNOSIS — N760 Acute vaginitis: Secondary | ICD-10-CM

## 2015-12-15 DIAGNOSIS — O23591 Infection of other part of genital tract in pregnancy, first trimester: Secondary | ICD-10-CM | POA: Insufficient documentation

## 2015-12-15 HISTORY — DX: Trichomoniasis, unspecified: A59.9

## 2015-12-15 LAB — URINALYSIS, ROUTINE W REFLEX MICROSCOPIC
Glucose, UA: NEGATIVE mg/dL
HGB URINE DIPSTICK: NEGATIVE
KETONES UR: 15 mg/dL — AB
Leukocytes, UA: NEGATIVE
NITRITE: NEGATIVE
Protein, ur: NEGATIVE mg/dL
Specific Gravity, Urine: >1.03 — ABNORMAL HIGH (ref 1.005–1.030)
pH: 5.5 (ref 5.0–8.0)

## 2015-12-15 LAB — WET PREP, GENITAL
Sperm: NONE SEEN
YEAST WET PREP: NONE SEEN

## 2015-12-15 LAB — CBC WITH DIFFERENTIAL/PLATELET
BASOS PCT: 1 %
Basophils Absolute: 0 10*3/uL (ref 0.0–0.1)
EOS PCT: 2 %
Eosinophils Absolute: 0.1 10*3/uL (ref 0.0–0.7)
HCT: 35.3 % — ABNORMAL LOW (ref 36.0–46.0)
HEMOGLOBIN: 11.7 g/dL — AB (ref 12.0–15.0)
Lymphocytes Relative: 33 %
Lymphs Abs: 2.7 10*3/uL (ref 0.7–4.0)
MCH: 27 pg (ref 26.0–34.0)
MCHC: 33.1 g/dL (ref 30.0–36.0)
MCV: 81.5 fL (ref 78.0–100.0)
Monocytes Absolute: 0.5 10*3/uL (ref 0.1–1.0)
Monocytes Relative: 6 %
NEUTROS PCT: 58 %
Neutro Abs: 4.8 10*3/uL (ref 1.7–7.7)
Platelets: 389 10*3/uL (ref 150–400)
RBC: 4.33 MIL/uL (ref 3.87–5.11)
RDW: 15.7 % — ABNORMAL HIGH (ref 11.5–15.5)
WBC: 8.1 10*3/uL (ref 4.0–10.5)

## 2015-12-15 LAB — HCG, QUANTITATIVE, PREGNANCY: hCG, Beta Chain, Quant, S: 5869 m[IU]/mL — ABNORMAL HIGH (ref <5)

## 2015-12-15 LAB — POCT PREGNANCY, URINE: Preg Test, Ur: POSITIVE — AB

## 2015-12-15 LAB — OB RESULTS CONSOLE GC/CHLAMYDIA: Gonorrhea: NEGATIVE

## 2015-12-15 MED ORDER — METRONIDAZOLE 500 MG PO TABS: 500.0000 mg | ORAL_TABLET | Freq: Two times a day (BID) | ORAL | Status: DC

## 2015-12-15 MED ORDER — PRENATAL VITAMINS 0.8 MG PO TABS: 1.0000 | ORAL_TABLET | Freq: Every day | ORAL | Status: DC

## 2015-12-15 MED ORDER — METRONIDAZOLE 500 MG PO TABS
2000.0000 mg | ORAL_TABLET | Freq: Once | ORAL | Status: AC
  Administered 2015-12-15: 2000 mg via ORAL
  Filled 2015-12-15: qty 4

## 2015-12-15 NOTE — [facility] Note (Addendum)
Patient presents stating that she has +HPT X 2. Denies pain, bleeding or discharge. Called her OB Gaynell Face([place]) and was instructed to contact [facility] who told her to come to [facility] for confirmation of pregnancy.

## 2015-12-15 NOTE — Discharge Instructions (Signed)
Bacterial Vaginosis °Bacterial vaginosis is an infection of the vagina. It happens when too many germs (bacteria) grow in the vagina. Having this infection puts you at risk for getting other infections from sex. Treating this infection can help lower your risk for other infections, such as:  °· Chlamydia. °· Gonorrhea. °· HIV. °· Herpes. °HOME CARE °· Take your medicine as told by your doctor. °· Finish your medicine even if you start to feel better. °· Tell your sex partner that you have an infection. They should see their doctor for treatment. °· During treatment: °· Avoid sex or use condoms correctly. °· Do not douche. °· Do not drink alcohol unless your doctor tells you it is ok. °· Do not breastfeed unless your doctor tells you it is ok. °GET HELP IF: °· You are not getting better after 3 days of treatment. °· You have more grey fluid (discharge) coming from your vagina than before. °· You have more pain than before. °· You have a fever. °MAKE SURE YOU:  °· Understand these instructions. °· Will watch your condition. °· Will get help right away if you are not doing well or get worse. °  °This information is not intended to replace advice given to you by your health care provider. Make sure you discuss any questions you have with your health care provider. °  °Document Released: 05/14/2008 Document Revised: 08/26/2014 Document Reviewed: 03/17/2013 °Elsevier Interactive Patient Education ©2016 [employer] °Subchorionic Hematoma °A subchorionic hematoma is a gathering of blood between the outer wall of the placenta and the inner wall of the womb (uterus). The placenta is the organ that connects the fetus to the wall of the uterus. The placenta performs the feeding, breathing (oxygen to the fetus), and waste removal (excretory work) of the fetus.  °Subchorionic hematoma is the most common abnormality found on a result from ultrasonography done during the first trimester or early second trimester of pregnancy. If  there has been little or no vaginal bleeding, early small hematomas usually shrink on their own and do not affect your baby or pregnancy. The blood is gradually absorbed over 1-2 weeks. When bleeding starts later in pregnancy or the hematoma is larger or occurs in an older pregnant woman, the outcome may not be as good. Larger hematomas may get bigger, which increases the chances for miscarriage. Subchorionic hematoma also increases the risk of premature detachment of the placenta from the uterus, preterm (premature) labor, and stillbirth. °HOME CARE INSTRUCTIONS °· Stay on bed rest if your health care provider recommends this. Although bed rest will not prevent more bleeding or prevent a miscarriage, your health care provider may recommend bed rest until you are advised otherwise. °· Avoid heavy lifting (more than 10 lb [4.5 kg]), exercise, sexual intercourse, or douching as directed by your health care provider. °· Keep track of the number of pads you use each day and how soaked (saturated) they are. Write down this information. °· Do not use tampons. °· Keep all follow-up appointments as directed by your health care provider. Your health care provider may ask you to have follow-up blood tests or ultrasound tests or both. °SEEK IMMEDIATE MEDICAL CARE IF: °· You have severe cramps in your stomach, back, abdomen, or pelvis. °· You have a fever. °· You pass large clots or tissue. Save any tissue for your health care provider to look at. °· Your bleeding increases or you become lightheaded, feel weak, or have fainting episodes. °  °This information is   not intended to replace advice given to you by your health care provider. Make sure you discuss any questions you have with your health care provider.   Document Released: 11/20/2006 Document Revised: 08/26/2014 Document Reviewed: 03/04/2013 Elsevier Interactive Patient Education 2016 [employer] Trichomoniasis Trichomoniasis is an infection caused by an organism  called Trichomonas. The infection can affect both women and men. In women, the outer female genitalia and the vagina are affected. In men, the penis is mainly affected, but the prostate and other reproductive organs can also be involved. Trichomoniasis is a sexually transmitted infection (STI) and is most often passed to another person through sexual contact.  RISK FACTORS  Having unprotected sexual intercourse.  Having sexual intercourse with an infected partner. SIGNS AND SYMPTOMS  Symptoms of trichomoniasis in women include:  Abnormal gray-green frothy vaginal discharge.  Itching and irritation of the vagina.  Itching and irritation of the area outside the vagina. Symptoms of trichomoniasis in men include:   Penile discharge with or without pain.  Pain during urination. This results from inflammation of the urethra. DIAGNOSIS  Trichomoniasis may be found during a Pap test or physical exam. Your health care provider may use one of the following methods to help diagnose this infection:  Testing the pH of the vagina with a test tape.  Using a vaginal swab test that checks for the Trichomonas organism. A test is available that provides results within a few minutes.  Examining a urine sample.  Testing vaginal secretions. Your health care provider may test you for other STIs, including HIV. TREATMENT   You may be given medicine to fight the infection. Women should inform their health care provider if they could be or are pregnant. Some medicines used to treat the infection should not be taken during pregnancy.  Your health care provider may recommend over-the-counter medicines or creams to decrease itching or irritation.  Your sexual partner will need to be treated if infected.  Your health care provider may test you for infection again 3 months after treatment. HOME CARE INSTRUCTIONS   Take medicines only as directed by your health care provider.  Take over-the-counter  medicine for itching or irritation as directed by your health care provider.  Do not have sexual intercourse while you have the infection.  Women should not douche or wear tampons while they have the infection.  Discuss your infection with your partner. Your partner may have gotten the infection from you, or you may have gotten it from your partner.  Have your sex partner get examined and treated if necessary.  Practice safe, informed, and protected sex.  See your health care provider for other STI testing. SEEK MEDICAL CARE IF:   You still have symptoms after you finish your medicine.  You develop abdominal pain.  You have pain when you urinate.  You have bleeding after sexual intercourse.  You develop a rash.  Your medicine makes you sick or makes you throw up (vomit). MAKE SURE YOU:  Understand these instructions.  Will watch your condition.  Will get help right away if you are not doing well or get worse.   This information is not intended to replace advice given to you by your health care provider. Make sure you discuss any questions you have with your health care provider.   Document Released: 01/29/2001 Document Revised: 08/26/2014 Document Reviewed: 05/17/2013 Elsevier Interactive Patient Education [employer]2016 [employer]

## 2015-12-15 NOTE — MAU Provider Note (Signed)
History     CSN: [id number]  Arrival date and time: 12/15/15 1259   First Provider Initiated Contact with Patient 12/15/15 1345      Chief Complaint  Patient presents with  . Possible Pregnancy  . Abdominal Pain   HPI [name] is a 30 y.o. [id number] at 7060w0d who presents to MAU today with complaint of abdominal cramping. The patient states pain is mild, rated at 3/10 now. She denies using any pain medication. She denies vaginal bleeding, discharge, UTI symptoms, fever, N/V/D or constipation. She states LMP 11/03/15 and +HPT recently.   OB History    Gravida Para Term Preterm AB TAB SAB Ectopic Multiple Living   6 4 4  0 1 0 0 0 0 4      Past Medical History  Diagnosis Date  . Depression     ok now    Past Surgical History  Procedure Laterality Date  . Wisdom tooth extraction      Family History  Problem Relation Age of Onset  . Hypertension Mother   . Cancer Maternal Aunt     breast  . Hearing loss Neg Hx     Social History  Substance Use Topics  . Smoking status: Current Every Day Smoker -- 0.50 packs/day for 10 years    Types: Cigarettes  . Smokeless tobacco: Never Used  . Alcohol Use: Yes     Comment: socially on occ.-liquor    Allergies: No Known Allergies  Prescriptions prior to admission  Medication Sig Dispense Refill Last Dose  . diphenhydramine-acetaminophen (TYLENOL PM) 25-500 MG TABS Take 1 tablet by mouth at bedtime as needed (sleep).   prn at prn  . [DISCONTINUED] diphenhydrAMINE (BENADRYL) 25 mg capsule Take 1 capsule (25 mg total) by mouth every 6 (six) hours as needed. 30 capsule 0 prn at prn  . [DISCONTINUED] Prenatal Multivit-Min-Fe-FA (PRENATAL VITAMINS PO) Take 1 tablet by mouth daily.   two weeks    Review of Systems  Constitutional: Negative for fever and malaise/fatigue.  Gastrointestinal: Positive for abdominal pain. Negative for nausea, vomiting, diarrhea and constipation.  Genitourinary: Negative for dysuria, urgency and  frequency.       Neg - vaginal bleeding, discharge   Physical Exam   Blood pressure 130/67, pulse 82, temperature 98.5 F (36.9 C), temperature source Oral, resp. rate 16, height 5\' 4"  (1.626 m), weight 178 lb 8 oz (80.967 kg), last menstrual period 11/03/2015, unknown if currently breastfeeding.  Physical Exam  Nursing note and vitals reviewed. Constitutional: She is oriented to person, place, and time. She appears well-developed and well-nourished. No distress.  HENT:  Head: Normocephalic and atraumatic.  Cardiovascular: Normal rate.   Respiratory: Effort normal.  GI: Soft. She exhibits no distension and no mass. There is no tenderness. There is no rebound and no guarding.  Genitourinary: Uterus is not enlarged and not tender. Cervix exhibits no motion tenderness, no discharge and no friability. Right adnexum displays no mass and no tenderness. Left adnexum displays no mass and no tenderness. No bleeding in the vagina. Vaginal discharge (moderate amount of thin, white discharge noted) found.  Neurological: She is alert and oriented to person, place, and time.  Skin: Skin is warm and dry. No erythema.  Psychiatric: She has a normal mood and affect.   Results for orders placed or performed during the hospital encounter of 12/15/15 (from the past 24 hour(s))  Urinalysis, Routine w reflex microscopic (not [facility])     Status: Abnormal  Collection Time: 12/15/15  1:29 PM  Result Value Ref Range   Color, Urine YELLOW YELLOW   APPearance CLEAR CLEAR   Specific Gravity, Urine >1.030 (H) 1.005 - 1.030   pH 5.5 5.0 - 8.0   Glucose, UA NEGATIVE NEGATIVE mg/dL   Hgb urine dipstick NEGATIVE NEGATIVE   Bilirubin Urine SMALL (A) NEGATIVE   Ketones, ur 15 (A) NEGATIVE mg/dL   Protein, ur NEGATIVE NEGATIVE mg/dL   Nitrite NEGATIVE NEGATIVE   Leukocytes, UA NEGATIVE NEGATIVE  Pregnancy, urine POC     Status: Abnormal   Collection Time: 12/15/15  1:40 PM  Result Value Ref Range   Preg Test, Ur  POSITIVE (A) NEGATIVE  CBC with Differential/Platelet     Status: Abnormal   Collection Time: 12/15/15  1:44 PM  Result Value Ref Range   WBC 8.1 4.0 - 10.5 K/uL   RBC 4.33 3.87 - 5.11 MIL/uL   Hemoglobin 11.7 (L) 12.0 - 15.0 g/dL   HCT 71.6 (L) 96.7 - 89.3 %   MCV 81.5 78.0 - 100.0 fL   MCH 27.0 26.0 - 34.0 pg   MCHC 33.1 30.0 - 36.0 g/dL   RDW 81.0 (H) 17.5 - 10.2 %   Platelets 389 150 - 400 K/uL   Neutrophils Relative % 58 %   Neutro Abs 4.8 1.7 - 7.7 K/uL   Lymphocytes Relative 33 %   Lymphs Abs 2.7 0.7 - 4.0 K/uL   Monocytes Relative 6 %   Monocytes Absolute 0.5 0.1 - 1.0 K/uL   Eosinophils Relative 2 %   Eosinophils Absolute 0.1 0.0 - 0.7 K/uL   Basophils Relative 1 %   Basophils Absolute 0.0 0.0 - 0.1 K/uL  Wet prep, genital     Status: Abnormal   Collection Time: 12/15/15  2:08 PM  Result Value Ref Range   Yeast Wet Prep HPF POC NONE SEEN NONE SEEN   Trich, Wet Prep PRESENT (A) NONE SEEN   Clue Cells Wet Prep HPF POC PRESENT (A) NONE SEEN   WBC, Wet Prep HPF POC FEW (A) NONE SEEN   Sperm NONE SEEN    US Ob Comp Less 14 Wks  12/15/2015  CLINICAL DATA:  First trimester of pregnancy, acute lower abdominal pain. EXAM: OBSTETRIC <14 WK [place] AND TRANSVAGINAL OB US TECHNIQUE: Both transabdominal and transvaginal ultrasound examinations were performed for complete evaluation of the gestation as well as the maternal uterus, adnexal regions, and pelvic cul-de-sac. Transvaginal technique was performed to assess early pregnancy. COMPARISON:  Ultrasound of May 18, 2014. FINDINGS: Intrauterine gestational sac: Single. Yolk sac:  Visualized. Embryo:  Not visualized. Cardiac Activity: Not visualized. MSD: 6.8  mm   5 w   3  d Subchorionic hemorrhage:  Moderate subchorionic hemorrhage is noted. Maternal uterus/adnexae: Ovaries appear normal. No free fluid is noted. IMPRESSION: Moderate subchorionic hemorrhage is noted. Probable early intrauterine gestational sac with yolk sac, but no  fetal pole or cardiac activity yet visualized. Recommend follow-up quantitative B-HCG levels and follow-up US in 14 days to confirm and assess viability. This recommendation follows SRU consensus guidelines: Diagnostic Criteria for Nonviable Pregnancy Early in the First Trimester. Malva Limes Med 2013; 585:2778-24. Electronically Signed   By: Lupita Raider, M.D.   On: 12/15/2015 14:42   US Ob Transvaginal  12/15/2015  CLINICAL DATA:  First trimester of pregnancy, acute lower abdominal pain. EXAM: OBSTETRIC <14 WK [place] AND TRANSVAGINAL OB US TECHNIQUE: Both transabdominal and transvaginal ultrasound examinations were performed for complete  evaluation of the gestation as well as the maternal uterus, adnexal regions, and pelvic cul-de-sac. Transvaginal technique was performed to assess early pregnancy. COMPARISON:  Ultrasound of May 18, 2014. FINDINGS: Intrauterine gestational sac: Single. Yolk sac:  Visualized. Embryo:  Not visualized. Cardiac Activity: Not visualized. MSD: 6.8  mm   5 w   3  d Subchorionic hemorrhage:  Moderate subchorionic hemorrhage is noted. Maternal uterus/adnexae: Ovaries appear normal. No free fluid is noted. IMPRESSION: Moderate subchorionic hemorrhage is noted. Probable early intrauterine gestational sac with yolk sac, but no fetal pole or cardiac activity yet visualized. Recommend follow-up quantitative B-HCG levels and follow-up US in 14 days to confirm and assess viability. This recommendation follows SRU consensus guidelines: Diagnostic Criteria for Nonviable Pregnancy Early in the First Trimester. Malva Limes Med 2013; 045:4098-11. Electronically Signed   By: Lupita Raider, M.D.   On: 12/15/2015 14:42    MAU Course  Procedures None  MDM +UPT UA, wet prep, GC/chlamydia, CBC, quant hCG, HIV, RPR and [place] today to rule out ectopic pregnancy + trichomonas noted on wet prep Patient treated with 2G Flagyl in MAU today  Assessment and Plan  A: IUGS and YS at [redacted]w[redacted]d Abdominal  pain in pregnancy, first trimester Trichomonas Bacterial vaginosis Moderate subchorionic hemorrhage  P: Discharge home Rx for Flagyl and PNV sent to patient's pharmacy First trimester precautions discussed Partner treatment for trichomonas advised  Patient advised to follow-up with [facility] as planned to start prenatal care. Pregnancy confirmation letter given.  Patient may return to MAU as needed or if her condition were to change or worsen   Marny Lowenstein, PA-C  12/15/2015, 2:55 PM

## 2015-12-15 NOTE — MAU Note (Signed)
Pt states she has been experiencing left lower abd cramping pain for about 1 week.  States it feels similar to when she miscarried.  Denies vaginal bleeding or abnormal discharge.  Pt does desire to have STD check as well.

## 2015-12-16 LAB — RPR: RPR Ser Ql: NONREACTIVE

## 2015-12-16 LAB — HIV ANTIBODY (ROUTINE TESTING W REFLEX): HIV Screen 4th Generation wRfx: NONREACTIVE

## 2015-12-18 LAB — GC/CHLAMYDIA PROBE AMP (~~LOC~~) NOT AT ARMC
CHLAMYDIA, DNA PROBE: NEGATIVE
Neisseria Gonorrhea: NEGATIVE

## 2015-12-20 ENCOUNTER — Inpatient Hospital Stay (HOSPITAL_COMMUNITY)
Admission: AD | Admit: 2015-12-20 | Discharge: 2015-12-20 | Disposition: A | Discharge status: Home / Self Care | Payer: Medicaid Other | Source: Ambulatory Visit | Attending: Obstetrics and Gynecology | Admitting: Obstetrics and Gynecology

## 2015-12-20 DIAGNOSIS — B373 Candidiasis of vulva and vagina: Secondary | ICD-10-CM | POA: Insufficient documentation

## 2015-12-20 DIAGNOSIS — B3731 Acute candidiasis of vulva and vagina: Secondary | ICD-10-CM

## 2015-12-20 DIAGNOSIS — F17210 Nicotine dependence, cigarettes, uncomplicated: Secondary | ICD-10-CM | POA: Diagnosis not present

## 2015-12-20 LAB — URINE MICROSCOPIC-ADD ON

## 2015-12-20 LAB — URINALYSIS, ROUTINE W REFLEX MICROSCOPIC
Bilirubin Urine: NEGATIVE
Glucose, UA: NEGATIVE mg/dL
Hgb urine dipstick: NEGATIVE
KETONES UR: 15 mg/dL — AB
NITRITE: NEGATIVE
Protein, ur: NEGATIVE mg/dL
Specific Gravity, Urine: 1.02 (ref 1.005–1.030)
pH: 6 (ref 5.0–8.0)

## 2015-12-20 MED ORDER — TERCONAZOLE 0.8 % VA CREA: 1.0000 | TOPICAL_CREAM | Freq: Every day | VAGINAL | Status: DC

## 2015-12-20 NOTE — MAU Provider Note (Signed)
History     CSN: [id number]  Arrival date and time: 12/20/15 1254   None       Chief Complaint  Patient presents with  . Vaginal Itching   HPI Beecher Mcardleinique J Gary is a 30 y.o. [id number] at 4585w5d who presents with vaginal itching & burning. Patient has been taking flagyl for BV; had 2 gm dose last week for trich treatment & started taking on Sunday for BV. Took doses on Sunday & Monday then discontinued it d/t vaginal itching. Reports intense vaginal itching & burning since starting the flagyl. Some thick white discharge as well. Denies abdominal pain or vaginal bleeding.   OB History    Gravida Para Term Preterm AB TAB SAB Ectopic Multiple Living   6 4 4  0 1 0 0 0 0 4      Past Medical History  Diagnosis Date  . Depression     ok now  . Trichomoniasis     Past Surgical History  Procedure Laterality Date  . Wisdom tooth extraction      Family History  Problem Relation Age of Onset  . Hypertension Mother   . Cancer Maternal Aunt     breast  . Hearing loss Neg Hx     Social History  Substance Use Topics  . Smoking status: Current Every Day Smoker -- 0.50 packs/day for 10 years    Types: Cigarettes  . Smokeless tobacco: Never Used  . Alcohol Use: Yes     Comment: socially on occ.-liquor    Allergies: No Known Allergies  Prescriptions prior to admission  Medication Sig Dispense Refill Last Dose  . Prenatal Multivit-Min-Fe-FA (PRENATAL VITAMINS) 0.8 MG tablet Take 1 tablet by mouth daily. 30 tablet 10 12/20/2015 at Unknown time  . metroNIDAZOLE (FLAGYL) 500 MG tablet Take 1 tablet (500 mg total) by mouth 2 (two) times daily. 14 tablet 0 12/17/2015    Review of Systems  Constitutional: Negative.   Gastrointestinal: Positive for nausea. Negative for vomiting, abdominal pain and diarrhea.  Genitourinary: Negative for dysuria.       + vaginal itching No vaginal bleeding  Skin: Positive for itching.   Physical Exam   Blood pressure 113/62, pulse 97, temperature 98.4  F (36.9 C), temperature source Oral, resp. rate 18, height 5\' 4"  (1.626 m), weight 177 lb 3.2 oz (80.377 kg), last menstrual period 11/03/2015, SpO2 99 %, unknown if currently breastfeeding.  Physical Exam  Nursing note and vitals reviewed. Constitutional: She is oriented to person, place, and time. She appears well-developed and well-nourished. No distress.  HENT:  Head: Normocephalic and atraumatic.  Eyes: Conjunctivae are normal. Right eye exhibits no discharge. Left eye exhibits no discharge. No scleral icterus.  Neck: Normal range of motion.  Cardiovascular: Normal rate.   Respiratory: Effort normal. No respiratory distress.  Genitourinary:  Bilateral labial erythema & white plaque discharge adherent to labia  Neurological: She is alert and oriented to person, place, and time.  Skin: Skin is warm and dry. She is not diaphoretic.  Psychiatric: She has a normal mood and affect. Her behavior is normal. Judgment and thought content normal.    MAU Course  Procedures Results for orders placed or performed during the hospital encounter of 12/20/15 (from the past 24 hour(s))  Urinalysis, Routine w reflex microscopic (not [facility])     Status: Abnormal   Collection Time: 12/20/15 12:55 PM  Result Value Ref Range   Color, Urine YELLOW YELLOW   APPearance HAZY (A) CLEAR  Specific Gravity, Urine 1.020 1.005 - 1.030   pH 6.0 5.0 - 8.0   Glucose, UA NEGATIVE NEGATIVE mg/dL   Hgb urine dipstick NEGATIVE NEGATIVE   Bilirubin Urine NEGATIVE NEGATIVE   Ketones, ur 15 (A) NEGATIVE mg/dL   Protein, ur NEGATIVE NEGATIVE mg/dL   Nitrite NEGATIVE NEGATIVE   Leukocytes, UA SMALL (A) NEGATIVE  Urine microscopic-add on     Status: Abnormal   Collection Time: 12/20/15 12:55 PM  Result Value Ref Range   Squamous Epithelial / LPF 6-30 (A) NONE SEEN   WBC, UA 6-30 0 - 5 WBC/hpf   RBC / HPF 0-5 0 - 5 RBC/hpf   Bacteria, UA MANY (A) NONE SEEN   Urine-Other YEAST PRESENT     MDM Yeast + on urine  micro Will treat with terazole Pt has initial ob scheduled with Dr. Clearance Coots 6/1  Assessment and Plan  A: 1. Vaginal yeast infection     P; Discharge home Rx terazol Keep f/u with ob Discussed reasons to return  Judeth Horn 12/20/2015, 1:44 PM

## 2015-12-20 NOTE — MAU Note (Signed)
Pt states she was started on a new med last week.  She started the med on Sunday.  Vaginal itching started Monday.  Thinks it's related to the new medication (Flagyl??).  Denies bleeding.

## 2015-12-20 NOTE — Discharge Instructions (Signed)

## 2016-01-17 ENCOUNTER — Encounter: Payer: Self-pay | Admitting: Certified Nurse Midwife

## 2016-01-17 ENCOUNTER — Ambulatory Visit (INDEPENDENT_AMBULATORY_CARE_PROVIDER_SITE_OTHER): Payer: Medicaid Other | Admitting: Certified Nurse Midwife

## 2016-01-17 VITALS — BP 108/71 | HR 85 | Temp 99.3°F | Wt 177.0 lb

## 2016-01-17 DIAGNOSIS — Z1389 Encounter for screening for other disorder: Secondary | ICD-10-CM

## 2016-01-17 DIAGNOSIS — Z716 Tobacco abuse counseling: Secondary | ICD-10-CM

## 2016-01-17 DIAGNOSIS — Z3490 Encounter for supervision of normal pregnancy, unspecified, unspecified trimester: Secondary | ICD-10-CM | POA: Insufficient documentation

## 2016-01-17 DIAGNOSIS — Z3491 Encounter for supervision of normal pregnancy, unspecified, first trimester: Secondary | ICD-10-CM

## 2016-01-17 DIAGNOSIS — O219 Vomiting of pregnancy, unspecified: Secondary | ICD-10-CM | POA: Diagnosis not present

## 2016-01-17 DIAGNOSIS — Z3481 Encounter for supervision of other normal pregnancy, first trimester: Secondary | ICD-10-CM

## 2016-01-17 DIAGNOSIS — Z331 Pregnant state, incidental: Secondary | ICD-10-CM | POA: Diagnosis not present

## 2016-01-17 LAB — POCT URINALYSIS DIPSTICK
BILIRUBIN UA: NEGATIVE
GLUCOSE UA: NEGATIVE
Ketones, UA: NEGATIVE
Leukocytes, UA: NEGATIVE
Nitrite, UA: NEGATIVE
Protein, UA: NEGATIVE
RBC UA: NEGATIVE
SPEC GRAV UA: 1.02
Urobilinogen, UA: NEGATIVE
pH, UA: 5

## 2016-01-17 MED ORDER — DOXYLAMINE-PYRIDOXINE 10-10 MG PO TBEC: DELAYED_RELEASE_TABLET | ORAL | Status: DC

## 2016-01-17 MED ORDER — VITAFOL GUMMIES 3.33-0.333-34.8 MG PO CHEW: 3.0000 | CHEWABLE_TABLET | Freq: Every day | ORAL | Status: DC

## 2016-01-17 NOTE — Progress Notes (Signed)
Subjective:    Cindy Cruz is being seen today for her first obstetrical visit.  This is not a planned pregnancy. She is at 3165w5d gestation. Her obstetrical history is significant for smoker and 3-4/day. Relationship with FOB: significant other, living together. Patient does not intend to breast feed. Pregnancy history fully reviewed.  The information documented in the HPI was reviewed and verified.  Menstrual History: OB History    Gravida Para Term Preterm AB TAB SAB Ectopic Multiple Living   6 4 4  0 1 0 0 0 0 8      Menarche age: 30 years of age  Patient's last menstrual period was 11/03/2015 (lmp unknown).    Past Medical History  Diagnosis Date  . Depression     ok now  . Trichomoniasis     Past Surgical History  Procedure Laterality Date  . Wisdom tooth extraction       (Not in a hospital admission) No Known Allergies  Social History  Substance Use Topics  . Smoking status: Current Every Day Smoker -- 0.50 packs/day for 10 years    Types: Cigarettes  . Smokeless tobacco: Never Used  . Alcohol Use: Yes     Comment: socially on occ.-liquor    Family History  Problem Relation Age of Onset  . Hypertension Mother   . Cancer Maternal Aunt     breast  . Hearing loss Neg Hx      Review of Systems Constitutional: negative for weight loss Gastrointestinal: negative for vomiting, + nausea Genitourinary:negative for genital lesions and vaginal discharge and dysuria Musculoskeletal:negative for back pain Behavioral/Psych: negative for abusive relationship, depression, illegal drug usage and tobacco use    Objective:    BP 108/71 mmHg  Pulse 85  Temp(Src) 99.3 F (37.4 C)  Wt 177 lb (80.287 kg)  LMP 11/03/2015 (LMP Unknown) General Appearance:    Alert, cooperative, no distress, appears stated age  Head:    Normocephalic, without obvious abnormality, atraumatic  Eyes:    PERRL, conjunctiva/corneas clear, EOM's intact, fundi    benign, both eyes  Ears:     Normal TM's and external ear canals, both ears  Nose:   Nares normal, septum midline, mucosa normal, no drainage    or sinus tenderness  Throat:   Lips, mucosa, and tongue normal; teeth and gums normal  Neck:   Supple, symmetrical, trachea midline, no adenopathy;    thyroid:  no enlargement/tenderness/nodules; no carotid   bruit or JVD  Back:     Symmetric, no curvature, ROM normal, no CVA tenderness  Lungs:     Clear to auscultation bilaterally, respirations unlabored  Chest Wall:    No tenderness or deformity   Heart:    Regular rate and rhythm, S1 and S2 normal, no murmur, rub   or gallop  Breast Exam:    No tenderness, masses, or nipple abnormality  Abdomen:     Soft, non-tender, bowel sounds active all four quadrants,    no masses, no organomegaly  Genitalia:    Normal female without lesion, discharge or tenderness  Extremities:   Extremities normal, atraumatic, no cyanosis or edema  Pulses:   2+ and symmetric all extremities  Skin:   Skin color, texture, turgor normal, no rashes or lesions  Lymph nodes:   Cervical, supraclavicular, and axillary nodes normal  Neurologic:   CNII-XII intact, normal strength, sensation and reflexes    throughout       Cervix:  Long, thick, closed and posterior  Lab Review Urine pregnancy test Labs reviewed yes Radiologic studies reviewed yes Assessment:    Pregnancy at [redacted]w[redacted]d weeks   H/O IUFD  weeks last pregnancy  N&V in early pregnancy  Tobacco cessation counseling  Plan:      Prenatal vitamins.  Counseling provided regarding continued use of seat belts, cessation of alcohol consumption, smoking or use of illicit drugs; infection precautions i.e., influenza/TDAP immunizations, toxoplasmosis,CMV, parvovirus, listeria and varicella; workplace safety, exercise during pregnancy; routine dental care, safe medications, sexual activity, hot tubs, saunas, pools, travel, caffeine use, fish and methlymercury, potential toxins, hair treatments,  varicose veins Weight gain recommendations per IOM guidelines reviewed: underweight/BMI< 18.5--> gain 28 - 40 lbs; normal weight/BMI 18.5 - 24.9--> gain 25 - 35 lbs; overweight/BMI 25 - 29.9--> gain 15 - 25 lbs; obese/BMI >30->gain  11 - 20 lbs Problem list reviewed and updated. FIRST/CF mutation testing/NIPT/QUAD SCREEN/fragile X/Ashkenazi Jewish population testing/Spinal muscular atrophy discussed: ordered. Role of ultrasound in pregnancy discussed; fetal survey: requested. Amniocentesis discussed: not indicated. VBAC calculator score: VBAC consent form provided No orders of the defined types were placed in this encounter.   Orders Placed This Encounter  Procedures  . POCT urinalysis dipstick    Follow up in 4 weeks. 50% of 30 min visit spent on counseling and coordination of care.

## 2016-01-18 ENCOUNTER — Encounter: Payer: Medicaid Other | Admitting: Obstetrics

## 2016-01-18 ENCOUNTER — Other Ambulatory Visit: Payer: Self-pay | Admitting: Certified Nurse Midwife

## 2016-01-19 ENCOUNTER — Other Ambulatory Visit: Payer: Self-pay | Admitting: Certified Nurse Midwife

## 2016-01-19 LAB — PRENATAL PROFILE I(LABCORP)
Antibody Screen: NEGATIVE
BASOS: 0 %
Basophils Absolute: 0 10*3/uL (ref 0.0–0.2)
EOS (ABSOLUTE): 0.1 10*3/uL (ref 0.0–0.4)
EOS: 1 %
HEMATOCRIT: 35.6 % (ref 34.0–46.6)
HEP B S AG: NEGATIVE
Hemoglobin: 12.1 g/dL (ref 11.1–15.9)
Immature Grans (Abs): 0 10*3/uL (ref 0.0–0.1)
Immature Granulocytes: 0 %
LYMPHS: 38 %
Lymphocytes Absolute: 3.1 10*3/uL (ref 0.7–3.1)
MCH: 28.1 pg (ref 26.6–33.0)
MCHC: 34 g/dL (ref 31.5–35.7)
MCV: 83 fL (ref 79–97)
MONOCYTES: 6 %
Monocytes Absolute: 0.5 10*3/uL (ref 0.1–0.9)
NEUTROS ABS: 4.4 10*3/uL (ref 1.4–7.0)
Neutrophils: 55 %
Platelets: 354 10*3/uL (ref 150–379)
RBC: 4.31 x10E6/uL (ref 3.77–5.28)
RDW: 16.1 % — ABNORMAL HIGH (ref 12.3–15.4)
RH TYPE: POSITIVE
RPR: REACTIVE — AB
WBC: 8.1 10*3/uL (ref 3.4–10.8)

## 2016-01-19 LAB — NUSWAB VG+, CANDIDA 6SP
CANDIDA ALBICANS, NAA: NEGATIVE
CANDIDA GLABRATA, NAA: NEGATIVE
CANDIDA KRUSEI, NAA: NEGATIVE
CANDIDA LUSITANIAE, NAA: NEGATIVE
CHLAMYDIA TRACHOMATIS, NAA: NEGATIVE
Candida parapsilosis, NAA: NEGATIVE
Candida tropicalis, NAA: NEGATIVE
NEISSERIA GONORRHOEAE, NAA: NEGATIVE
Trich vag by NAA: NEGATIVE

## 2016-01-19 LAB — URINE CULTURE, OB REFLEX

## 2016-01-19 LAB — TSH: TSH: 0.748 u[IU]/mL (ref 0.450–4.500)

## 2016-01-19 LAB — HEMOGLOBINOPATHY EVALUATION
HGB C: 0 %
HGB S: 0 %
Hemoglobin A2 Quantitation: 2.2 % (ref 0.7–3.1)
Hemoglobin F Quantitation: 0 % (ref 0.0–2.0)
Hgb A: 97.8 % (ref 94.0–98.0)

## 2016-01-19 LAB — RPR, QUANT+TP ABS (REFLEX): T Pallidum Abs: NEGATIVE

## 2016-01-19 LAB — VARICELLA ZOSTER ANTIBODY, IGG: VARICELLA: 648 {index} (ref >165)

## 2016-01-19 LAB — HIV ANTIBODY (ROUTINE TESTING W REFLEX): HIV SCREEN 4TH GENERATION: NONREACTIVE

## 2016-01-19 LAB — CULTURE, OB URINE

## 2016-01-22 LAB — PAP IG W/ RFLX HPV ASCU: PAP SMEAR COMMENT: 0

## 2016-01-24 ENCOUNTER — Other Ambulatory Visit: Payer: Self-pay | Admitting: Certified Nurse Midwife

## 2016-01-25 ENCOUNTER — Other Ambulatory Visit: Payer: Self-pay | Admitting: Certified Nurse Midwife

## 2016-01-25 LAB — MATERNIT21 PLUS CORE+SCA
CHROMOSOME 13: NEGATIVE
Chromosome 18: NEGATIVE
Chromosome 21: NEGATIVE
PDF: 0
Y CHROMOSOME: DETECTED

## 2016-02-14 ENCOUNTER — Encounter: Payer: Self-pay | Admitting: *Deleted

## 2016-02-14 ENCOUNTER — Ambulatory Visit (INDEPENDENT_AMBULATORY_CARE_PROVIDER_SITE_OTHER): Payer: Medicaid Other | Admitting: Certified Nurse Midwife

## 2016-02-14 VITALS — BP 101/67 | HR 81 | Wt 175.0 lb

## 2016-02-14 DIAGNOSIS — Z331 Pregnant state, incidental: Secondary | ICD-10-CM

## 2016-02-14 DIAGNOSIS — Z1389 Encounter for screening for other disorder: Secondary | ICD-10-CM

## 2016-02-14 DIAGNOSIS — O0992 Supervision of high risk pregnancy, unspecified, second trimester: Secondary | ICD-10-CM

## 2016-02-14 LAB — POCT URINALYSIS DIPSTICK
BILIRUBIN UA: NEGATIVE
Blood, UA: NEGATIVE
Glucose, UA: NEGATIVE
KETONES UA: NEGATIVE
LEUKOCYTES UA: NEGATIVE
Nitrite, UA: NEGATIVE
PROTEIN UA: NEGATIVE
Spec Grav, UA: 1.02
Urobilinogen, UA: NEGATIVE
pH, UA: 5

## 2016-02-14 NOTE — Progress Notes (Signed)
  Subjective:    Cindy Cruz is a 30 y.o. female being seen today for her obstetrical visit. She is at 3557w5d gestation. Patient reports: backache, no leaking, occasional contractions and reports cramping during work shift, reports vaginal spotting dark brown last week when she wiped; nothing since. .  Problem List Items Addressed This Visit    None    Visit Diagnoses    Supervision of high risk pregnancy, antepartum, second trimester    -  Primary    Relevant Orders    AMB referral to maternal fetal medicine    US MFM OB DETAIL +14 WK      Patient Active Problem List   Diagnosis Date Noted  . Encounter for supervision of other normal pregnancy in first trimester 01/17/2016  . Tobacco abuse counseling 01/17/2016  . Fetal demise 05/24/2014  . IUFD (intrauterine fetal death) 05/24/2014    Objective:     BP 101/67 mmHg  Pulse 81  Wt 175 lb (79.379 kg)  LMP 11/03/2015 (LMP Unknown) Uterine Size: Below umbilicus   FHR: 160 by doppler  Assessment:    Pregnancy @ 9757w5d  weeks  Vaginal spottingX1 occurrence  Cramping while at work:  Work letter written for restrictions     H/O IUFD @15  weeks   Plan:    Work Physicist, medicalletter for restrictions and increased breaks.    F/U vaginal bleeding discussed at length.   Problem list reviewed and updated. Labs reviewed.  Follow up in 4 weeks. FIRST/CF mutation testing/NIPT/QUAD SCREEN/fragile X/Ashkenazi Jewish population testing/Spinal muscular atrophy discussed: results reviewed. Role of ultrasound in pregnancy discussed; fetal survey: ordered. Amniocentesis discussed: not indicated. 50% of 20 minute visit spent on counseling and coordination of care.

## 2016-02-14 NOTE — Addendum Note (Signed)
Addended by: Luella CookMCINTYRE, Omauri Boeve E on: 02/14/2016 04:01 PM   Modules accepted: Orders

## 2016-02-23 ENCOUNTER — Encounter (HOSPITAL_COMMUNITY): Payer: Self-pay | Admitting: Certified Nurse Midwife

## 2016-03-08 ENCOUNTER — Encounter (HOSPITAL_COMMUNITY): Payer: Self-pay | Admitting: Certified Nurse Midwife

## 2016-03-13 ENCOUNTER — Encounter: Payer: Medicaid Other | Admitting: Certified Nurse Midwife

## 2016-03-13 ENCOUNTER — Ambulatory Visit (HOSPITAL_COMMUNITY): Payer: Medicaid Other

## 2016-03-14 ENCOUNTER — Encounter (HOSPITAL_COMMUNITY): Payer: Self-pay

## 2016-03-15 ENCOUNTER — Ambulatory Visit (HOSPITAL_COMMUNITY)
Admission: RE | Admit: 2016-03-15 | Discharge: 2016-03-15 | Disposition: A | Discharge status: Home / Self Care | Payer: Medicaid Other | Source: Ambulatory Visit | Attending: Certified Nurse Midwife | Admitting: Certified Nurse Midwife

## 2016-03-15 ENCOUNTER — Ambulatory Visit (INDEPENDENT_AMBULATORY_CARE_PROVIDER_SITE_OTHER): Payer: Medicaid Other | Admitting: Certified Nurse Midwife

## 2016-03-15 ENCOUNTER — Other Ambulatory Visit: Payer: Self-pay | Admitting: Certified Nurse Midwife

## 2016-03-15 ENCOUNTER — Ambulatory Visit (HOSPITAL_COMMUNITY): Payer: Medicaid Other

## 2016-03-15 ENCOUNTER — Encounter (HOSPITAL_COMMUNITY): Payer: Self-pay

## 2016-03-15 VITALS — BP 125/76 | HR 89 | Temp 97.2°F | Wt 178.0 lb

## 2016-03-15 VITALS — BP 109/71 | HR 89 | Wt 179.0 lb

## 2016-03-15 DIAGNOSIS — Z3A19 19 weeks gestation of pregnancy: Secondary | ICD-10-CM | POA: Insufficient documentation

## 2016-03-15 DIAGNOSIS — O0992 Supervision of high risk pregnancy, unspecified, second trimester: Secondary | ICD-10-CM | POA: Diagnosis not present

## 2016-03-15 DIAGNOSIS — Z1389 Encounter for screening for other disorder: Secondary | ICD-10-CM | POA: Diagnosis not present

## 2016-03-15 DIAGNOSIS — Z331 Pregnant state, incidental: Secondary | ICD-10-CM | POA: Diagnosis not present

## 2016-03-15 DIAGNOSIS — Z36 Encounter for antenatal screening of mother: Secondary | ICD-10-CM | POA: Insufficient documentation

## 2016-03-15 DIAGNOSIS — O09299 Supervision of pregnancy with other poor reproductive or obstetric history, unspecified trimester: Secondary | ICD-10-CM

## 2016-03-15 DIAGNOSIS — O09292 Supervision of pregnancy with other poor reproductive or obstetric history, second trimester: Secondary | ICD-10-CM | POA: Insufficient documentation

## 2016-03-15 DIAGNOSIS — IMO0002 Reserved for concepts with insufficient information to code with codable children: Secondary | ICD-10-CM

## 2016-03-15 LAB — POCT URINALYSIS DIPSTICK
BILIRUBIN UA: NEGATIVE
GLUCOSE UA: NEGATIVE
LEUKOCYTES UA: NEGATIVE
NITRITE UA: NEGATIVE
PH UA: 5
Protein, UA: NEGATIVE
RBC UA: NEGATIVE
Spec Grav, UA: 1.02
UROBILINOGEN UA: NEGATIVE

## 2016-03-15 MED ORDER — ASPIRIN EC 81 MG PO TBEC: 81.0000 mg | DELAYED_RELEASE_TABLET | Freq: Every day | ORAL | 6 refills | Status: DC

## 2016-03-15 MED ORDER — PRENATE PIXIE 10-0.6-0.4-200 MG PO CAPS: 1.0000 | ORAL_CAPSULE | Freq: Every day | ORAL | 12 refills | Status: DC

## 2016-03-15 NOTE — Progress Notes (Signed)
Subjective:    Cindy Cruz is a 30 y.o. female being seen today for her obstetrical visit. She is at [redacted]w[redacted]d gestation. Patient reports: no complaints . Fetal movement: normal.  Problem List Items Addressed This Visit    None    Visit Diagnoses    Supervision of high risk pregnancy in second trimester    -  Primary   Relevant Medications   Prenat-FeAsp-Meth-FA-DHA w/o A (PRENATE PIXIE) 10-0.6-0.4-200 MG CAPS   Other Relevant Orders   POCT urinalysis dipstick (Completed)     Patient Active Problem List   Diagnosis Date Noted  . Encounter for supervision of other normal pregnancy in first trimester 01/17/2016  . Tobacco abuse counseling 01/17/2016  . Fetal demise June 02, 2014  . IUFD (intrauterine fetal death) 2014/06/02   Objective:    BP 125/76   Pulse 89   Temp 97.2 F (36.2 C)   Wt 178 lb (80.7 kg)   LMP 11/03/2015 (LMP Unknown)   BMI 30.55 kg/m  FHT: 135 BPM  Uterine Size: size equals dates     Assessment:    Pregnancy @ [redacted]w[redacted]d    Plan:    Notes reviewed from MFM.    OBGCT: discussed. Signs and symptoms of preterm labor: discussed.  Labs, problem list reviewed and updated 2 hr GTT planned Follow up in 4 weeks.

## 2016-03-15 NOTE — Progress Notes (Signed)
MFM Consult, Staff Note:  By way of consultation, I discussed the patient's history of 16 5/7 week IUFD/second trimester missed abortion in context of today's ultrasound findings. The history of stillbirth or missed abortion in the second/midtrimester qualifies as a clinical criteria to be screened for APS (antiphospholipid antibody syndrome).  I spoke with the patient at length about the implications of antiphospholipid syndrome (APS) in pregnancy. As you know, women with this diagnosis are at substantial risk for the development of thromboembolism as well as adverse pregnancy complications such as recurrent miscarriage, stillbirth, placental insufficiency, and preeclampsia. The diagnosis of APS should be based on the presence of one of these clinical factors as well as a positive laboratory test for an antiphospholipid antibody such as the lupus anticoagulant, IgG anticardiolipin, and IgM anticardiolipin. Anti-beta2glycoprotein I antibodies have also been associated with APS.  The use of thromboprophylaxis with heparin during pregnancy has been shown to decrease the risk of thromboembolism and pregnancy loss. Its effect on placental insufficiency and preeclampsia is not as dramatic. Pregnancy management also typically includes prenatal visits every 1 -2 weeks throughout pregnancy, serial ultrasound assesments of fetal growth, and antenatal surveillance initiated at around 30 weeks of gestation or earlier. Delivery before term should be anticipated and care should be taken in the peripartum period to reduce the risk of thromboembolism.   After MFM consultation following her ultrasound the patient accepted and desired a complete workup for antiphosphilipid antibody syndrome. A positive finding would result in the need perform a second panel for APS to document 2 positives 12 weeks apart by [place] criteria for diagnosis of APS syndrome.  Recommendations: 1. 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 2. APS panel drawn today. Results will predicate my diagnosis and management thereafter. 3. Hx 2nd trimester IUFD/missed Ab:  ASA 81 mg po qd until 36 weeks for IUGR/IUFD prevention.  Interval growth ultrasounds in the pregnancy each month (scheduled).  Time Spent: I spent in excess of 30 minutes in consultation with this patient to review records, evaluate her case, and provide her with an adequate discussion and education. More than 50% of this time was spent in direct face-to-face counseling.  It was a pleasure seeing your patient in the office today. Thank you for consultation. Please do not hesitate to contact our service for any further questions.   Thank you, Louann Sjogren Gaynelle Arabian, Louann Sjogren, MD, MS, FACOG Assistant Professor Section of Maternal-Fetal Medicine [facility]

## 2016-03-17 LAB — CARDIOLIPIN ANTIBODIES, IGG, IGM, IGA
ANTICARDIOLIPIN IGM: 9 [MPL'U]/mL (ref 0–12)
Anticardiolipin IgA: <9 APL U/mL (ref 0–11)

## 2016-03-18 LAB — LUPUS ANTICOAGULANT PANEL
DRVVT: 38.3 s (ref 0.0–47.0)
PTT Lupus Anticoagulant: 37.8 s (ref 0.0–51.9)

## 2016-03-18 LAB — BETA-2-GLYCOPROTEIN I ABS, IGG/M/A: BETA-2-GLYCOPROTEIN I IGA: 9 GPI IgA units (ref 0–25)

## 2016-04-11 ENCOUNTER — Encounter (HOSPITAL_COMMUNITY): Payer: Self-pay

## 2016-04-12 ENCOUNTER — Ambulatory Visit (HOSPITAL_COMMUNITY)
Admission: RE | Admit: 2016-04-12 | Discharge: 2016-04-12 | Disposition: A | Discharge status: Home / Self Care | Payer: Medicaid Other | Source: Ambulatory Visit | Attending: Certified Nurse Midwife | Admitting: Certified Nurse Midwife

## 2016-04-12 ENCOUNTER — Ambulatory Visit (INDEPENDENT_AMBULATORY_CARE_PROVIDER_SITE_OTHER): Payer: Medicaid Other | Admitting: Certified Nurse Midwife

## 2016-04-12 ENCOUNTER — Encounter (HOSPITAL_COMMUNITY): Payer: Self-pay

## 2016-04-12 VITALS — BP 113/73 | HR 76

## 2016-04-12 DIAGNOSIS — Z331 Pregnant state, incidental: Secondary | ICD-10-CM

## 2016-04-12 DIAGNOSIS — O09292 Supervision of pregnancy with other poor reproductive or obstetric history, second trimester: Secondary | ICD-10-CM | POA: Insufficient documentation

## 2016-04-12 DIAGNOSIS — K219 Gastro-esophageal reflux disease without esophagitis: Secondary | ICD-10-CM

## 2016-04-12 DIAGNOSIS — Z3492 Encounter for supervision of normal pregnancy, unspecified, second trimester: Secondary | ICD-10-CM | POA: Diagnosis not present

## 2016-04-12 DIAGNOSIS — Z1389 Encounter for screening for other disorder: Secondary | ICD-10-CM | POA: Diagnosis not present

## 2016-04-12 DIAGNOSIS — O99612 Diseases of the digestive system complicating pregnancy, second trimester: Secondary | ICD-10-CM

## 2016-04-12 DIAGNOSIS — IMO0002 Reserved for concepts with insufficient information to code with codable children: Secondary | ICD-10-CM

## 2016-04-12 DIAGNOSIS — Z3A23 23 weeks gestation of pregnancy: Secondary | ICD-10-CM | POA: Diagnosis not present

## 2016-04-12 DIAGNOSIS — O43192 Other malformation of placenta, second trimester: Secondary | ICD-10-CM | POA: Diagnosis not present

## 2016-04-12 DIAGNOSIS — Z3481 Encounter for supervision of other normal pregnancy, first trimester: Secondary | ICD-10-CM

## 2016-04-12 LAB — POCT URINALYSIS DIPSTICK
BILIRUBIN UA: NEGATIVE
Glucose, UA: NEGATIVE
Ketones, UA: NEGATIVE
LEUKOCYTES UA: NEGATIVE
NITRITE UA: NEGATIVE
PH UA: 7.5
RBC UA: NEGATIVE
Spec Grav, UA: 1.025
UROBILINOGEN UA: 0.2

## 2016-04-12 MED ORDER — OMEPRAZOLE 20 MG PO CPDR: 20.0000 mg | DELAYED_RELEASE_CAPSULE | Freq: Two times a day (BID) | ORAL | 5 refills | Status: DC

## 2016-04-12 NOTE — Progress Notes (Signed)
Subjective:    Cindy Cruz is a 30 y.o. female being seen today for her obstetrical visit. She is at 2765w0d gestation. Patient reports: no bleeding, no contractions, no cramping, no leaking and vaginal irritation . Fetal movement: normal.  Problem List Items Addressed This Visit    None    Visit Diagnoses    Prenatal care, second trimester    -  Primary   Relevant Orders   POCT Urinalysis Dipstick (Completed)   Gastroesophageal reflux in pregancy in second trimester       Relevant Medications   omeprazole (PRILOSEC) 20 MG capsule     Patient Active Problem List   Diagnosis Date Noted  . Encounter for supervision of other normal pregnancy in first trimester 01/17/2016  . Tobacco abuse counseling 01/17/2016  . Fetal demise 05/24/2014  . IUFD (intrauterine fetal death) 05/24/2014   Objective:    BP 113/73   Pulse 76   LMP 11/03/2015 (LMP Unknown)  FHT: 131 BPM  Uterine Size: 24 cm and size equals dates     Assessment:    Pregnancy @ 2365w0d    H/O IUFD @15wks   Reflux in pregnancy  Plan:   Desires waterbirth   OBGCT: discussed and ordered for next visit. Signs and symptoms of preterm labor: discussed.  Labs, problem list reviewed and updated 2 hr GTT planned Follow up in 4 weeks.

## 2016-04-12 NOTE — Addendum Note (Signed)
Addended by: Marya LandryFOSTER, Alva Kuenzel D on: 04/12/2016 04:22 PM   Modules accepted: Orders

## 2016-04-12 NOTE — Patient Instructions (Addendum)
Indigestion Indigestion is a feeling of pain, discomfort, burning, or fullness in the upper part of your abdomen. It can come and go. It may occur frequently or rarely. Indigestion tends to occur while you are eating or right after you have finished eating. It may be worse at night and while bending over or lying down. HOME CARE INSTRUCTIONS Take these actions to decrease your pain or discomfort and to help avoid complications. Diet  Follow a diet as recommended by your health care provider. This may involve avoiding foods and drinks such as:  Coffee and tea (with or without caffeine).  Drinks that contain alcohol.  Energy drinks and sports drinks.  Carbonated drinks or sodas.  Chocolate and cocoa.  Peppermint and mint flavorings.  Garlic and onions.  Horseradish.  Spicy and acidic foods, including peppers, chili powder, curry powder, vinegar, hot sauces, and barbecue sauce.  Citrus fruit juices and citrus fruits, such as oranges, lemons, and limes.  Tomato-based foods, such as red sauce, chili, salsa, and pizza with red sauce.  Fried and fatty foods, such as donuts, french fries, potato chips, and high-fat dressings.  High-fat meats, such as hot dogs and fatty cuts of red and white meats, such as rib eye steak, sausage, ham, and bacon.  High-fat dairy items, such as whole milk, butter, and cream cheese.  Eat small, frequent meals instead of large meals.  Avoid drinking large amounts of liquid with your meals.  Avoid eating meals during the 2-3 hours before bedtime.  Avoid lying down right after you eat.  Do not exercise right after you eat. General Instructions  Pay attention to any changes in your symptoms.  Take over-the-counter and prescription medicines only as told by your health care provider. Do not take aspirin, ibuprofen, or other NSAIDs unless your health care provider told you to do so.  Do not use any tobacco products, including cigarettes, chewing  tobacco, and e-cigarettes. If you need help quitting, ask your health care provider.  Wear loose-fitting clothing. Do not wear anything tight around your waist that causes pressure on your abdomen.  Raise (elevate) the head of your bed about 6 inches (15 cm).  Try to reduce your stress, such as with yoga or meditation. If you need help reducing stress, ask your health care provider.  If you are overweight, reduce your weight to an amount that is healthy for you. Ask your health care provider for guidance about a safe weight loss goal.  Keep all follow-up visits as told by your health care provider. This is important. SEEK MEDICAL CARE IF:  You have new symptoms.  You have unexplained weight loss.  You have difficulty swallowing, or it hurts to swallow.  Your symptoms do not improve with treatment.  Your symptoms last for more than two days.  You have a fever.  You vomit. SEEK IMMEDIATE MEDICAL CARE IF:  You have pain in your arms, neck, jaw, teeth, or back.  You feel sweaty, dizzy, or light-headed.  You faint.  You have chest pain or shortness of breath.  You cannot stop vomiting, or you vomit blood.  Your stool is bloody or black.  You have severe pain in your abdomen.   This information is not intended to replace advice given to you by your health care provider. Make sure you discuss any questions you have with your health care provider.   Document Released: 09/12/2004 Document Revised: 04/26/2015 Document Reviewed: 11/30/2014 Elsevier Interactive Patient Education 2016 [place]  of Pregnancy The second trimester is from week 13 through week 28, months 4 through 6. The second trimester is often a time when you feel your best. Your body has also adjusted to being pregnant, and you begin to feel better physically. Usually, morning sickness has lessened or quit completely, you may have more energy, and you may have an increase in appetite. The  second trimester is also a time when the fetus is growing rapidly. At the end of the sixth month, the fetus is about 9 inches long and weighs about 1 pounds. You will likely begin to feel the baby move (quickening) between 18 and 20 weeks of the pregnancy. BODY CHANGES Your body goes through many changes during pregnancy. The changes vary from woman to woman.   Your weight will continue to increase. You will notice your lower abdomen bulging out.  You may begin to get stretch marks on your hips, abdomen, and breasts.  You may develop headaches that can be relieved by medicines approved by your health care provider.  You may urinate more often because the fetus is pressing on your bladder.  You may develop or continue to have heartburn as a result of your pregnancy.  You may develop constipation because certain hormones are causing the muscles that push waste through your intestines to slow down.  You may develop hemorrhoids or swollen, bulging veins (varicose veins).  You may have back pain because of the weight gain and pregnancy hormones relaxing your joints between the bones in your pelvis and as a result of a shift in weight and the muscles that support your balance.  Your breasts will continue to grow and be tender.  Your gums may bleed and may be sensitive to brushing and flossing.  Dark spots or blotches (chloasma, mask of pregnancy) may develop on your face. This will likely fade after the baby is born.  A dark line from your belly button to the pubic area (linea nigra) may appear. This will likely fade after the baby is born.  You may have changes in your hair. These can include thickening of your hair, rapid growth, and changes in texture. Some women also have hair loss during or after pregnancy, or hair that feels dry or thin. Your hair will most likely return to normal after your baby is born. WHAT TO EXPECT AT YOUR PRENATAL VISITS During a routine prenatal visit:  You  will be weighed to make sure you and the fetus are growing normally.  Your blood pressure will be taken.  Your abdomen will be measured to track your baby's growth.  The fetal heartbeat will be listened to.  Any test results from the previous visit will be discussed. Your health care provider may ask you:  How you are feeling.  If you are feeling the baby move.  If you have had any abnormal symptoms, such as leaking fluid, bleeding, severe headaches, or abdominal cramping.  If you are using any tobacco products, including cigarettes, chewing tobacco, and electronic cigarettes.  If you have any questions. Other tests that may be performed during your second trimester include:  Blood tests that check for:  Low iron levels (anemia).  Gestational diabetes (between 24 and 28 weeks).  Rh antibodies.  Urine tests to check for infections, diabetes, or protein in the urine.  An ultrasound to confirm the proper growth and development of the baby.  An amniocentesis to check for possible genetic problems.  Fetal screens for spina bifida and  Down syndrome.  HIV (human immunodeficiency virus) testing. Routine prenatal testing includes screening for HIV, unless you choose not to have this test. HOME CARE INSTRUCTIONS   Avoid all smoking, herbs, alcohol, and unprescribed drugs. These chemicals affect the formation and growth of the baby.  Do not use any tobacco products, including cigarettes, chewing tobacco, and electronic cigarettes. If you need help quitting, ask your health care provider. You may receive counseling support and other resources to help you quit.  Follow your health care provider's instructions regarding medicine use. There are medicines that are either safe or unsafe to take during pregnancy.  Exercise only as directed by your health care provider. Experiencing uterine cramps is a good sign to stop exercising.  Continue to eat regular, healthy meals.  Wear a good  support bra for breast tenderness.  Do not use hot tubs, steam rooms, or saunas.  Wear your seat belt at all times when driving.  Avoid raw meat, uncooked cheese, cat litter boxes, and soil used by cats. These carry germs that can cause birth defects in the baby.  Take your prenatal vitamins.  Take 1500-2000 mg of calcium daily starting at the 20th week of pregnancy until you deliver your baby.  Try taking a stool softener (if your health care provider approves) if you develop constipation. Eat more high-fiber foods, such as fresh vegetables or fruit and whole grains. Drink plenty of fluids to keep your urine clear or pale yellow.  Take warm sitz baths to soothe any pain or discomfort caused by hemorrhoids. Use hemorrhoid cream if your health care provider approves.  If you develop varicose veins, wear support hose. Elevate your feet for 15 minutes, 3-4 times a day. Limit salt in your diet.  Avoid heavy lifting, wear low heel shoes, and practice good posture.  Rest with your legs elevated if you have leg cramps or low back pain.  Visit your dentist if you have not gone yet during your pregnancy. Use a soft toothbrush to brush your teeth and be gentle when you floss.  A sexual relationship may be continued unless your health care provider directs you otherwise.  Continue to go to all your prenatal visits as directed by your health care provider. SEEK MEDICAL CARE IF:   You have dizziness.  You have mild pelvic cramps, pelvic pressure, or nagging pain in the abdominal area.  You have persistent nausea, vomiting, or diarrhea.  You have a bad smelling vaginal discharge.  You have pain with urination. SEEK IMMEDIATE MEDICAL CARE IF:   You have a fever.  You are leaking fluid from your vagina.  You have spotting or bleeding from your vagina.  You have severe abdominal cramping or pain.  You have rapid weight gain or loss.  You have shortness of breath with chest  pain.  You notice sudden or extreme swelling of your face, hands, ankles, feet, or legs.  You have not felt your baby move in over an hour.  You have severe headaches that do not go away with medicine.  You have vision changes.   This information is not intended to replace advice given to you by your health care provider. Make sure you discuss any questions you have with your health care provider.   Document Released: 07/30/2001 Document Revised: 08/26/2014 Document Reviewed: 10/06/2012 Elsevier Interactive Patient Education 2016 [place]???  You must attend a Doren Custard class at [facility]  3rd Wednesday of every month from 7-9pm  Free  Register by calling [phone] or online at [email or web address]  Bring [place] the certificate from the class  Waterbirth supplies needed for [facility] Department patients:  Our practice has a Heritage manager in a Box tub at the hospital that you can borrow  You will need to purchase an accessory kit that has all needed supplies through [facility] [phone]) or online $175.00  Or you can purchase the supplies separately: o Single-use disposable tub liner for Birth Pool in a Box (REGULAR size) o New garden hose labeled "lead-free", "suitable for drinking water", o Electric drain pump to remove water (We recommend 792 gallon per hour or greater pump.)  o  "non-toxic" OR "water potable" o Garden hose to remove the dirty water o Fish net o Bathing suit top (optional) o Long-handled mirror (optional)  [employer] sells tubs for ~ $120 if you would rather purchase your own tub.  They also sell accessories, liners.    Www.waterbirthsolutions.com for tub purchases and supplies  [employer] (www.thelaborladies.com) $275 for tub rental/set-up & take down/kit   [employer] information regarding doulas (labor support) who  provide pool rentals:  IdentityList.se.htm   [employer] (www.thelaborladies.com)  IdentityList.se.htm   Things that would prevent you from having a waterbirth:  Premature, <37wks  Previous cesarean birth  Presence of thick meconium-stained fluid  Multiple gestation (Twins, triplets, etc.)  Uncontrolled diabetes or gestational diabetes requiring medication  Hypertension  Heavy vaginal bleeding  Non-reassuring fetal heart rate  Active infection (MRSA, etc.)  If your labor has to be induced and induction method requires continuous monitoring of the baby's heart rate  Other risks/issues identified by your obstetrical provider

## 2016-04-17 LAB — NUSWAB VG+, CANDIDA 6SP
CANDIDA ALBICANS, NAA: POSITIVE — AB
CHLAMYDIA TRACHOMATIS, NAA: NEGATIVE
Candida glabrata, NAA: NEGATIVE
Candida krusei, NAA: NEGATIVE
Candida lusitaniae, NAA: NEGATIVE
Candida parapsilosis, NAA: NEGATIVE
Candida tropicalis, NAA: NEGATIVE
Neisseria gonorrhoeae, NAA: NEGATIVE
TRICH VAG BY NAA: NEGATIVE

## 2016-04-24 ENCOUNTER — Other Ambulatory Visit: Payer: Self-pay | Admitting: Certified Nurse Midwife

## 2016-04-24 ENCOUNTER — Telehealth: Payer: Self-pay | Admitting: *Deleted

## 2016-04-24 DIAGNOSIS — B3731 Acute candidiasis of vulva and vagina: Secondary | ICD-10-CM

## 2016-04-24 DIAGNOSIS — B373 Candidiasis of vulva and vagina: Secondary | ICD-10-CM

## 2016-04-24 MED ORDER — FLUCONAZOLE 100 MG PO TABS: 100.0000 mg | ORAL_TABLET | Freq: Once | ORAL | 0 refills | Status: AC

## 2016-04-24 NOTE — Telephone Encounter (Signed)
Left phone message for patient to call for lab result.Yeast infection med at her pharmacy.

## 2016-04-25 ENCOUNTER — Telehealth: Payer: Self-pay | Admitting: *Deleted

## 2016-04-25 NOTE — Telephone Encounter (Signed)
Patient returned call and told she has medication at her pharmacy for yeast.

## 2016-05-09 ENCOUNTER — Encounter: Payer: Medicaid Other | Admitting: Obstetrics and Gynecology

## 2016-05-09 ENCOUNTER — Other Ambulatory Visit: Payer: Medicaid Other

## 2016-05-10 ENCOUNTER — Encounter (HOSPITAL_COMMUNITY): Payer: Self-pay

## 2016-05-10 ENCOUNTER — Ambulatory Visit (HOSPITAL_COMMUNITY): Payer: Medicaid Other

## 2016-05-10 ENCOUNTER — Ambulatory Visit (HOSPITAL_COMMUNITY)
Admission: RE | Admit: 2016-05-10 | Discharge: 2016-05-10 | Disposition: A | Discharge status: Home / Self Care | Payer: Medicaid Other | Source: Ambulatory Visit | Attending: Certified Nurse Midwife | Admitting: Certified Nurse Midwife

## 2016-05-10 ENCOUNTER — Other Ambulatory Visit (HOSPITAL_COMMUNITY): Payer: Self-pay | Admitting: Obstetrics and Gynecology

## 2016-05-10 DIAGNOSIS — O43193 Other malformation of placenta, third trimester: Secondary | ICD-10-CM

## 2016-05-10 DIAGNOSIS — Z3A27 27 weeks gestation of pregnancy: Secondary | ICD-10-CM | POA: Diagnosis not present

## 2016-05-10 DIAGNOSIS — IMO0002 Reserved for concepts with insufficient information to code with codable children: Secondary | ICD-10-CM

## 2016-05-10 DIAGNOSIS — O09292 Supervision of pregnancy with other poor reproductive or obstetric history, second trimester: Secondary | ICD-10-CM

## 2016-05-10 DIAGNOSIS — O43199 Other malformation of placenta, unspecified trimester: Secondary | ICD-10-CM | POA: Diagnosis present

## 2016-05-13 ENCOUNTER — Other Ambulatory Visit: Payer: Medicaid Other

## 2016-05-14 ENCOUNTER — Other Ambulatory Visit: Payer: Medicaid Other

## 2016-05-14 ENCOUNTER — Ambulatory Visit (INDEPENDENT_AMBULATORY_CARE_PROVIDER_SITE_OTHER): Payer: Medicaid Other | Admitting: Obstetrics & Gynecology

## 2016-05-14 VITALS — BP 110/75 | HR 101 | Temp 98.3°F | Wt 181.4 lb

## 2016-05-14 DIAGNOSIS — Z3483 Encounter for supervision of other normal pregnancy, third trimester: Secondary | ICD-10-CM | POA: Diagnosis not present

## 2016-05-14 DIAGNOSIS — R768 Other specified abnormal immunological findings in serum: Secondary | ICD-10-CM | POA: Insufficient documentation

## 2016-05-14 DIAGNOSIS — Z716 Tobacco abuse counseling: Secondary | ICD-10-CM

## 2016-05-14 DIAGNOSIS — Z3481 Encounter for supervision of other normal pregnancy, first trimester: Secondary | ICD-10-CM

## 2016-05-14 HISTORY — DX: Other specified abnormal immunological findings in serum: R76.8

## 2016-05-14 NOTE — Progress Notes (Signed)
   PRENATAL VISIT NOTE  Subjective:  Cindy Cruz is a 30 y.o. [phone] at 4331w0d being seen today for ongoing prenatal care.  She is currently monitored for the following issues for this low-risk pregnancy and has Encounter for supervision of other normal pregnancy in first trimester; Tobacco abuse counseling; and Biological false positive RPR test on her problem list.  Patient reports no complaints.  Contractions: Not present. Vag. Bleeding: None.  Movement: Present. Denies leaking of fluid.   The following portions of the patient's history were reviewed and updated as appropriate: allergies, current medications, past family history, past medical history, past social history, past surgical history and problem list. Problem list updated.  Objective:   Vitals:   05/14/16 0913  BP: 110/75  Pulse: (!) 101  Temp: 98.3 F (36.8 C)  Weight: 181 lb 6.4 oz (82.3 kg)    Fetal Status:     Movement: Present     General:  Alert, oriented and cooperative. Patient is in no acute distress.  Skin: Skin is warm and dry. No rash noted.   Cardiovascular: Normal heart rate noted  Respiratory: Normal respiratory effort, no problems with respiration noted  Abdomen: Soft, gravid, appropriate for gestational age. Pain/Pressure: Absent     Pelvic:  Cervical exam deferred        Extremities: Normal range of motion.  Edema: None  Mental Status: Normal mood and affect. Normal behavior. Normal judgment and thought content.   Urinalysis: Urine Protein: Trace Urine Glucose: Negative  Assessment and Plan:  Pregnancy: [id number] at 8031w0d  1. Tobacco abuse counseling Will quit  2. Supervision of normal pregnancy, antepartum, third trimester Had 15 week demise  3. Biological false positive RPR test 1:1 titer  Preterm labor symptoms and general obstetric precautions including but not limited to vaginal bleeding, contractions, leaking of fluid and fetal movement were reviewed in detail with the  patient. Please refer to After Visit Summary for other counseling recommendations.  Return in about 3 weeks (around 06/04/2016).  Adam PhenixJames G Shiloh Swopes, MD

## 2016-05-15 LAB — GLUCOSE TOLERANCE, 2 HOURS W/ 1HR
GLUCOSE, FASTING: 71 mg/dL (ref 65–91)
Glucose, 1 hour: 134 mg/dL (ref 65–179)
Glucose, 2 hour: 122 mg/dL (ref 65–152)

## 2016-05-15 LAB — CBC
HEMATOCRIT: 33.4 % — AB (ref 34.0–46.6)
HEMOGLOBIN: 11 g/dL — AB (ref 11.1–15.9)
MCH: 28.6 pg (ref 26.6–33.0)
MCHC: 32.9 g/dL (ref 31.5–35.7)
MCV: 87 fL (ref 79–97)
Platelets: 341 10*3/uL (ref 150–379)
RBC: 3.84 x10E6/uL (ref 3.77–5.28)
RDW: 13.8 % (ref 12.3–15.4)
WBC: 8 10*3/uL (ref 3.4–10.8)

## 2016-05-15 LAB — HIV ANTIBODY (ROUTINE TESTING W REFLEX): HIV SCREEN 4TH GENERATION: NONREACTIVE

## 2016-05-15 LAB — RPR: RPR Ser Ql: NONREACTIVE

## 2016-06-07 ENCOUNTER — Ambulatory Visit (HOSPITAL_COMMUNITY)
Admission: RE | Admit: 2016-06-07 | Discharge: 2016-06-07 | Disposition: A | Discharge status: Home / Self Care | Payer: Medicaid Other | Source: Ambulatory Visit | Attending: Certified Nurse Midwife | Admitting: Certified Nurse Midwife

## 2016-06-07 ENCOUNTER — Encounter (HOSPITAL_COMMUNITY): Payer: Self-pay

## 2016-06-07 ENCOUNTER — Encounter: Payer: Self-pay | Admitting: Obstetrics

## 2016-06-07 ENCOUNTER — Ambulatory Visit (HOSPITAL_COMMUNITY): Payer: Medicaid Other

## 2016-06-07 ENCOUNTER — Other Ambulatory Visit (HOSPITAL_COMMUNITY): Payer: Self-pay | Admitting: Obstetrics and Gynecology

## 2016-06-07 ENCOUNTER — Ambulatory Visit (INDEPENDENT_AMBULATORY_CARE_PROVIDER_SITE_OTHER): Payer: Medicaid Other | Admitting: Obstetrics

## 2016-06-07 VITALS — BP 102/69 | HR 90 | Temp 98.3°F | Wt 180.4 lb

## 2016-06-07 DIAGNOSIS — O021 Missed abortion: Secondary | ICD-10-CM

## 2016-06-07 DIAGNOSIS — Z3493 Encounter for supervision of normal pregnancy, unspecified, third trimester: Secondary | ICD-10-CM

## 2016-06-07 DIAGNOSIS — O99333 Smoking (tobacco) complicating pregnancy, third trimester: Secondary | ICD-10-CM | POA: Diagnosis not present

## 2016-06-07 DIAGNOSIS — O09293 Supervision of pregnancy with other poor reproductive or obstetric history, third trimester: Secondary | ICD-10-CM | POA: Insufficient documentation

## 2016-06-07 DIAGNOSIS — Z3A31 31 weeks gestation of pregnancy: Secondary | ICD-10-CM

## 2016-06-07 DIAGNOSIS — IMO0002 Reserved for concepts with insufficient information to code with codable children: Secondary | ICD-10-CM

## 2016-06-07 NOTE — Progress Notes (Signed)
Subjective:    Cindy Cruz is a 30 y.o. female being seen today for her obstetrical visit. She is at 6944w3d gestation. Patient reports no complaints. Fetal movement: normal.  Problem List Items Addressed This Visit    None    Visit Diagnoses    Prenatal care in third trimester    -  Primary     Patient Active Problem List   Diagnosis Date Noted  . Biological false positive RPR test 05/14/2016  . Encounter for supervision of other normal pregnancy in first trimester 01/17/2016  . Tobacco abuse counseling 01/17/2016   Objective:    LMP 11/03/2015 (LMP Unknown)  FHT:  150 BPM  Uterine Size: size equals dates  Presentation: unsure     Assessment:    Pregnancy @ 2144w3d weeks   Plan:     labs reviewed, problem list updated Consent signed. GBS sent TDAP offered  Rhogam given for RH negative Pediatrician: discussed. Infant feeding: plans to breastfeed. Maternity leave: discussed. Cigarette smoking: smokes 0.5 PPD. No orders of the defined types were placed in this encounter.  No orders of the defined types were placed in this encounter.  Follow up in 2 Weeks.

## 2016-06-24 ENCOUNTER — Ambulatory Visit (INDEPENDENT_AMBULATORY_CARE_PROVIDER_SITE_OTHER): Payer: Medicaid Other | Admitting: Obstetrics

## 2016-06-24 ENCOUNTER — Encounter: Payer: Self-pay | Admitting: Obstetrics

## 2016-06-24 VITALS — BP 105/70 | HR 82 | Temp 98.4°F | Wt 178.5 lb

## 2016-06-24 DIAGNOSIS — Z3403 Encounter for supervision of normal first pregnancy, third trimester: Secondary | ICD-10-CM

## 2016-06-24 DIAGNOSIS — R768 Other specified abnormal immunological findings in serum: Secondary | ICD-10-CM

## 2016-06-24 DIAGNOSIS — Z23 Encounter for immunization: Secondary | ICD-10-CM | POA: Diagnosis not present

## 2016-06-24 MED ORDER — TETANUS-DIPHTH-ACELL PERTUSSIS 5-2.5-18.5 LF-MCG/0.5 IM SUSP
0.5000 mL | Freq: Once | INTRAMUSCULAR | Status: AC
  Administered 2016-06-24: 0.5 mL via INTRAMUSCULAR

## 2016-06-24 NOTE — Assessment & Plan Note (Addendum)
  Clinic [facility] Prenatal Labs  Dating ULTRASOUND Blood type: O/Positive/-- (05/31 1058)   Genetic Screen 1 Screen:    AFP:     Quad:     NIPS: Antibody:Negative (05/31 1058)  Anatomic US WNL's Rubella: <0.90 (05/31 1058)  GTT Early:               Third trimester: WNL's RPR: Non Reactive (09/26 1054)   Flu vaccine  06-24-16 HBsAg: Negative (05/31 1058)   TDaP vaccine   06-24-16                                           Rhogam: HIV: Non Reactive (09/26 1054)   Baby Food                                               GBS: (For PCN allergy, check sensitivities)  Contraception  Pap:01-17-16   WNL's  Circumcision    Pediatrician    Support Person

## 2016-06-24 NOTE — Progress Notes (Signed)
Subjective:    Cindy Cruz is a 30 y.o. female being seen today for her obstetrical visit. She is at 287w6d gestation. Patient reports no complaints. Fetal movement: normal.  Problem List Items Addressed This Visit    Biological false positive RPR test     Clinic [facility] Prenatal Labs  Dating ULTRASOUND Blood type: O/Positive/-- (05/31 1058)   Genetic Screen 1 Screen:    AFP:     Quad:     NIPS: Antibody:Negative (05/31 1058)  Anatomic US WNL's Rubella: <0.90 (05/31 1058)  GTT Early:               Third trimester: WNL's RPR: Non Reactive (09/26 1054)   Flu vaccine  06-24-16 HBsAg: Negative (05/31 1058)   TDaP vaccine   06-24-16                                           Rhogam: HIV: Non Reactive (09/26 1054)   Baby Food                                               GBS: (For PCN allergy, check sensitivities)  Contraception  Pap:01-17-16   WNL's  Circumcision    Pediatrician    Support Person          Encounter for supervision of other normal pregnancy in first trimester - Primary     Patient Active Problem List   Diagnosis Date Noted  . Biological false positive RPR test 05/14/2016  . Encounter for supervision of other normal pregnancy in first trimester 01/17/2016  . Tobacco abuse counseling 01/17/2016   Objective:    BP 105/70   Pulse 82   Temp 98.4 F (36.9 C)   Wt 178 lb 8 oz (81 kg)   LMP 11/03/2015 (LMP Unknown)   BMI 30.64 kg/m  FHT:  140 BPM  Uterine Size: size equals dates  Presentation: unsure     Assessment:    Pregnancy @ [redacted]w[redacted]d weeks   Plan:     labs reviewed, problem list updated Consent signed. GBS sent TDAP offered  Rhogam given for RH negative Pediatrician: discussed. Infant feeding: plans to breastfeed. Maternity leave: discussed. Cigarette smoking: smokes 0.5 PPD. Orders Placed This Encounter  Procedures  . Flu Vaccine QUAD 36+ mos IM   Meds ordered this encounter  Medications  . Tdap (BOOSTRIX) injection 0.5 mL   Follow up in 2  Weeks.

## 2016-07-05 ENCOUNTER — Encounter (HOSPITAL_COMMUNITY): Payer: Self-pay

## 2016-07-05 ENCOUNTER — Ambulatory Visit (HOSPITAL_COMMUNITY)
Admission: RE | Admit: 2016-07-05 | Discharge: 2016-07-05 | Disposition: A | Discharge status: Home / Self Care | Payer: Medicaid Other | Source: Ambulatory Visit | Attending: Certified Nurse Midwife | Admitting: Certified Nurse Midwife

## 2016-07-05 ENCOUNTER — Other Ambulatory Visit (HOSPITAL_COMMUNITY): Payer: Self-pay | Admitting: Obstetrics and Gynecology

## 2016-07-05 DIAGNOSIS — Z3A35 35 weeks gestation of pregnancy: Secondary | ICD-10-CM

## 2016-07-05 DIAGNOSIS — Z362 Encounter for other antenatal screening follow-up: Secondary | ICD-10-CM

## 2016-07-05 DIAGNOSIS — O09293 Supervision of pregnancy with other poor reproductive or obstetric history, third trimester: Secondary | ICD-10-CM | POA: Insufficient documentation

## 2016-07-05 DIAGNOSIS — O99333 Smoking (tobacco) complicating pregnancy, third trimester: Secondary | ICD-10-CM | POA: Diagnosis not present

## 2016-07-05 DIAGNOSIS — IMO0002 Reserved for concepts with insufficient information to code with codable children: Secondary | ICD-10-CM

## 2016-07-09 ENCOUNTER — Ambulatory Visit (INDEPENDENT_AMBULATORY_CARE_PROVIDER_SITE_OTHER): Payer: Medicaid Other | Admitting: Obstetrics & Gynecology

## 2016-07-09 DIAGNOSIS — Z3493 Encounter for supervision of normal pregnancy, unspecified, third trimester: Secondary | ICD-10-CM

## 2016-07-09 DIAGNOSIS — Z3490 Encounter for supervision of normal pregnancy, unspecified, unspecified trimester: Secondary | ICD-10-CM

## 2016-07-09 MED ORDER — OMEPRAZOLE 20 MG PO CPDR: 20.0000 mg | DELAYED_RELEASE_CAPSULE | Freq: Two times a day (BID) | ORAL | 5 refills | Status: DC

## 2016-07-09 NOTE — Progress Notes (Signed)
   PRENATAL VISIT NOTE  Subjective:  Cindy Cruz is a 30 y.o. [phone] at 9718w0d being seen today for ongoing prenatal care.  She is currently monitored for the following issues for this low-risk pregnancy and has Encounter for supervision of low-risk pregnancy, antepartum; Tobacco abuse counseling; and Biological false positive RPR test on her problem list.  Patient reports no complaints.  Contractions: Regular. Vag. Bleeding: None.  Movement: Present. Denies leaking of fluid.   The following portions of the patient's history were reviewed and updated as appropriate: allergies, current medications, past family history, past medical history, past social history, past surgical history and problem list. Problem list updated.  Objective:   Vitals:   07/09/16 1145  BP: 110/71  Pulse: 94  Temp: 98.5 F (36.9 C)  Weight: 185 lb (83.9 kg)    Fetal Status:     Movement: Present     General:  Alert, oriented and cooperative. Patient is in no acute distress.  Skin: Skin is warm and dry. No rash noted.   Cardiovascular: Normal heart rate noted  Respiratory: Normal respiratory effort, no problems with respiration noted  Abdomen: Soft, gravid, appropriate for gestational age. Pain/Pressure: Present     Pelvic:  Cervical exam performed        Extremities: Normal range of motion.  Edema: None  Mental Status: Normal mood and affect. Normal behavior. Normal judgment and thought content.   Assessment and Plan:  Pregnancy: [id number] at 2218w0d  1. Encounter for supervision of low-risk pregnancy, antepartum  - Strep Gp B NAA - GC/Chlamydia probe amp ([place])not at [facility]  Preterm labor symptoms and general obstetric precautions including but not limited to vaginal bleeding, contractions, leaking of fluid and fetal movement were reviewed in detail with the patient. Please refer to After Visit Summary for other counseling recommendations.  Return in about 1 week (around 07/16/2016).   Adam PhenixJames  G Arnold, MD

## 2016-07-09 NOTE — Progress Notes (Signed)
Patient need RX for heartburn medication [facility]/ Pt. Thinks baby may be side ways.

## 2016-07-10 LAB — OB RESULTS CONSOLE GBS: STREP GROUP B AG: NEGATIVE

## 2016-07-11 LAB — STREP GP B NAA: STREP GROUP B AG: NEGATIVE

## 2016-07-18 ENCOUNTER — Encounter: Payer: Medicaid Other | Admitting: Obstetrics and Gynecology

## 2016-07-22 ENCOUNTER — Ambulatory Visit (INDEPENDENT_AMBULATORY_CARE_PROVIDER_SITE_OTHER): Payer: Medicaid Other | Admitting: Obstetrics

## 2016-07-22 ENCOUNTER — Encounter: Payer: Self-pay | Admitting: Obstetrics

## 2016-07-22 VITALS — BP 115/72 | HR 106 | Temp 97.2°F | Wt 185.6 lb

## 2016-07-22 DIAGNOSIS — Z3490 Encounter for supervision of normal pregnancy, unspecified, unspecified trimester: Secondary | ICD-10-CM

## 2016-07-22 DIAGNOSIS — Z3493 Encounter for supervision of normal pregnancy, unspecified, third trimester: Secondary | ICD-10-CM

## 2016-07-22 NOTE — Progress Notes (Signed)
Subjective:    Cindy Cruz is a 30 y.o. female being seen today for her obstetrical visit. She is at 3138w6d gestation. Patient reports no complaints. Fetal movement: normal.  Problem List Items Addressed This Visit    Encounter for supervision of low-risk pregnancy, antepartum - Primary     Patient Active Problem List   Diagnosis Date Noted  . Biological false positive RPR test 05/14/2016  . Encounter for supervision of low-risk pregnancy, antepartum 01/17/2016  . Tobacco abuse counseling 01/17/2016   Objective:    BP 115/72   Pulse (!) 106   Temp 97.2 F (36.2 C)   Wt 185 lb 9.6 oz (84.2 kg)   LMP 11/03/2015 (LMP Unknown)   BMI 31.86 kg/m  FHT:  150 BPM  Uterine Size: size equals dates  Presentation: unsure     Assessment:    Pregnancy @ 1038w6d weeks   Plan:     labs reviewed, problem list updated Consent signed. GBS sent TDAP offered  Rhogam given for RH negative Pediatrician: discussed. Infant feeding: plans to breastfeed. Maternity leave: discussed. Cigarette smoking: smokes 0.5 PPD. No orders of the defined types were placed in this encounter.  No orders of the defined types were placed in this encounter.  Follow up in 1 Week.

## 2016-07-29 ENCOUNTER — Ambulatory Visit (INDEPENDENT_AMBULATORY_CARE_PROVIDER_SITE_OTHER): Payer: Medicaid Other | Admitting: Obstetrics and Gynecology

## 2016-07-29 VITALS — BP 113/76 | HR 103 | Wt 181.6 lb

## 2016-07-29 DIAGNOSIS — Z716 Tobacco abuse counseling: Secondary | ICD-10-CM

## 2016-07-29 DIAGNOSIS — R768 Other specified abnormal immunological findings in serum: Secondary | ICD-10-CM

## 2016-07-29 DIAGNOSIS — Z3493 Encounter for supervision of normal pregnancy, unspecified, third trimester: Secondary | ICD-10-CM

## 2016-07-29 DIAGNOSIS — Z3490 Encounter for supervision of normal pregnancy, unspecified, unspecified trimester: Secondary | ICD-10-CM

## 2016-07-29 NOTE — Progress Notes (Signed)
   PRENATAL VISIT NOTE  Subjective:  Cindy Cruz is a 30 y.o. [phone] at 5969w6d being seen today for ongoing prenatal care.  She is currently monitored for the following issues for this low-risk pregnancy and has Encounter for supervision of low-risk pregnancy, antepartum; Tobacco abuse counseling; and Biological false positive RPR test on her problem list.  Patient reports no complaints.  Contractions: Irregular. Vag. Bleeding: None.  Movement: Present. Denies leaking of fluid.   The following portions of the patient's history were reviewed and updated as appropriate: allergies, current medications, past family history, past medical history, past social history, past surgical history and problem list. Problem list updated.  Objective:   Vitals:   07/29/16 1111  BP: 113/76  Pulse: (!) 103  Weight: 181 lb 9.6 oz (82.4 kg)    Fetal Status: Fetal Heart Rate (bpm): 132 Fundal Height: 37 cm Movement: Present     General:  Alert, oriented and cooperative. Patient is in no acute distress.  Skin: Skin is warm and dry. No rash noted.   Cardiovascular: Normal heart rate noted  Respiratory: Normal respiratory effort, no problems with respiration noted  Abdomen: Soft, gravid, appropriate for gestational age. Pain/Pressure: Present     Pelvic:  Cervical exam deferred        Extremities: Normal range of motion.  Edema: None  Mental Status: Normal mood and affect. Normal behavior. Normal judgment and thought content.   Assessment and Plan:  Pregnancy: [id number] at 6069w6d  1. Encounter for supervision of low-risk pregnancy, antepartum Patient is doing well without complaints Results of pelvic cultures reviewed  2. Biological false positive RPR test   3. Tobacco abuse counseling   Term labor symptoms and general obstetric precautions including but not limited to vaginal bleeding, contractions, leaking of fluid and fetal movement were reviewed in detail with the patient. Please refer to  After Visit Summary for other counseling recommendations.  Return in about 1 week (around 08/05/2016).   Catalina AntiguaPeggy Anees Vanecek, MD

## 2016-08-05 ENCOUNTER — Ambulatory Visit (INDEPENDENT_AMBULATORY_CARE_PROVIDER_SITE_OTHER): Payer: Medicaid Other | Admitting: Obstetrics & Gynecology

## 2016-08-05 DIAGNOSIS — Z3493 Encounter for supervision of normal pregnancy, unspecified, third trimester: Secondary | ICD-10-CM

## 2016-08-05 DIAGNOSIS — Z3490 Encounter for supervision of normal pregnancy, unspecified, unspecified trimester: Secondary | ICD-10-CM

## 2016-08-05 NOTE — Progress Notes (Signed)
Pt would like cervical exam today. 

## 2016-08-05 NOTE — Progress Notes (Signed)
   PRENATAL VISIT NOTE  Subjective:  Cindy Cruz is a 30 y.o. [id number] at 6562w6d being seen today for ongoing prenatal care.  She is currently monitored for the following issues for this low-risk pregnancy and has Encounter for supervision of low-risk pregnancy, antepartum; Tobacco abuse counseling; and Biological false positive RPR test on her problem list.  Patient reports no complaints.  Contractions: Irregular. Vag. Bleeding: None.  Movement: Present. Denies leaking of fluid.   The following portions of the patient's history were reviewed and updated as appropriate: allergies, current medications, past family history, past medical history, past social history, past surgical history and problem list. Problem list updated.  Objective:   Vitals:   08/05/16 1005  BP: 128/79  Pulse: (!) 101  Weight: 182 lb 8 oz (82.8 kg)    Fetal Status: Fetal Heart Rate (bpm): 160   Movement: Present     General:  Alert, oriented and cooperative. Patient is in no acute distress.  Skin: Skin is warm and dry. No rash noted.   Cardiovascular: Normal heart rate noted  Respiratory: Normal respiratory effort, no problems with respiration noted  Abdomen: Soft, gravid, appropriate for gestational age. Pain/Pressure: Present     Pelvic:  Cervical exam performed        Extremities: Normal range of motion.     Mental Status: Normal mood and affect. Normal behavior. Normal judgment and thought content.   Assessment and Plan:  Pregnancy: [id number] at 8162w6d  1. Encounter for supervision of low-risk pregnancy, antepartum   Term labor symptoms and general obstetric precautions including but not limited to vaginal bleeding, contractions, leaking of fluid and fetal movement were reviewed in detail with the patient. Please refer to After Visit Summary for other counseling recommendations.  No Follow-up on file.   Allie BossierMyra C Summerlynn Glauser, MD

## 2016-08-13 ENCOUNTER — Inpatient Hospital Stay (HOSPITAL_COMMUNITY)
Admission: AD | Admit: 2016-08-13 | Discharge: 2016-08-13 | Disposition: A | Discharge status: Home / Self Care | Payer: Medicaid Other | Source: Ambulatory Visit | Attending: Family Medicine | Admitting: Family Medicine

## 2016-08-13 ENCOUNTER — Encounter: Payer: Self-pay | Admitting: Obstetrics

## 2016-08-13 ENCOUNTER — Ambulatory Visit (INDEPENDENT_AMBULATORY_CARE_PROVIDER_SITE_OTHER): Payer: Medicaid Other | Admitting: Obstetrics

## 2016-08-13 ENCOUNTER — Encounter (HOSPITAL_COMMUNITY): Payer: Self-pay | Admitting: *Deleted

## 2016-08-13 VITALS — BP 117/78 | HR 104 | Temp 97.3°F | Wt 184.6 lb

## 2016-08-13 DIAGNOSIS — Z3A40 40 weeks gestation of pregnancy: Secondary | ICD-10-CM | POA: Diagnosis not present

## 2016-08-13 DIAGNOSIS — O99331 Smoking (tobacco) complicating pregnancy, first trimester: Secondary | ICD-10-CM | POA: Insufficient documentation

## 2016-08-13 DIAGNOSIS — O288 Other abnormal findings on antenatal screening of mother: Secondary | ICD-10-CM | POA: Diagnosis not present

## 2016-08-13 DIAGNOSIS — Z3493 Encounter for supervision of normal pregnancy, unspecified, third trimester: Secondary | ICD-10-CM

## 2016-08-13 DIAGNOSIS — F17210 Nicotine dependence, cigarettes, uncomplicated: Secondary | ICD-10-CM | POA: Insufficient documentation

## 2016-08-13 DIAGNOSIS — Z3490 Encounter for supervision of normal pregnancy, unspecified, unspecified trimester: Secondary | ICD-10-CM

## 2016-08-13 NOTE — Discharge Instructions (Signed)
Introduction Patient Name: ________________________________________________ Patient Due Date: ____________________ What is a fetal movement count? A fetal movement count is the number of times that you feel your baby move during a certain amount of time. This may also be called a fetal kick count. A fetal movement count is recommended for every pregnant woman. You may be asked to start counting fetal movements as early as week 28 of your pregnancy. Pay attention to when your baby is most active. You may notice your baby's sleep and wake cycles. You may also notice things that make your baby move more. You should do a fetal movement count:  When your baby is normally most active.  At the same time each day. A good time to count movements is while you are resting, after having something to eat and drink. How do I count fetal movements? 1. Find a quiet, comfortable area. Sit, or lie down on your side. 2. Write down the date, the start time and stop time, and the number of movements that you felt between those two times. Take this information with you to your health care visits. 3. For 2 hours, count kicks, flutters, swishes, rolls, and jabs. You should feel at least 10 movements during 2 hours. 4. You may stop counting after you have felt 10 movements. 5. If you do not feel 10 movements in 2 hours, have something to eat and drink. Then, keep resting and counting for 1 hour. If you feel at least 4 movements during that hour, you may stop counting. Contact a health care provider if:  You feel fewer than 4 movements in 2 hours.  Your baby is not moving like he or she usually does. Date: ____________ Start time: ____________ Stop time: ____________ Movements: ____________ Date: ____________ Start time: ____________ Stop time: ____________ Movements: ____________ Date: ____________ Start time: ____________ Stop time: ____________ Movements: ____________ Date: ____________ Start time: ____________  Stop time: ____________ Movements: ____________ Date: ____________ Start time: ____________ Stop time: ____________ Movements: ____________ Date: ____________ Start time: ____________ Stop time: ____________ Movements: ____________ Date: ____________ Start time: ____________ Stop time: ____________ Movements: ____________ Date: ____________ Start time: ____________ Stop time: ____________ Movements: ____________ Date: ____________ Start time: ____________ Stop time: ____________ Movements: ____________ This information is not intended to replace advice given to you by your health care provider. Make sure you discuss any questions you have with your health care provider. Document Released: 09/04/2006 Document Revised: 04/03/2016 Document Reviewed: 09/14/2015 Elsevier Interactive Patient Education  2017 Else[employer] Braxton Hicks Contractions Contractions of the uterus can occur throughout pregnancy. Contractions are not always a sign that you are in labor.  WHAT ARE BRAXTON HICKS CONTRACTIONS?  Contractions that occur before labor are called Braxton Hicks contractions, or false labor. Toward the end of pregnancy (32-34 weeks), these contractions can develop more often and may become more forceful. This is not true labor because these contractions do not result in opening (dilatation) and thinning of the cervix. They are sometimes difficult to tell apart from true labor because these contractions can be forceful and people have different pain tolerances. You should not feel embarrassed if you go to the hospital with false labor. Sometimes, the only way to tell if you are in true labor is for your health care provider to look for changes in the cervix. If there are no prenatal problems or other health problems associated with the pregnancy, it is completely safe to be sent home with false labor and await the onset of true labor.   HOW CAN YOU TELL THE DIFFERENCE BETWEEN TRUE AND FALSE LABOR? False Labor     The contractions of false labor are usually shorter and not as hard as those of true labor.   The contractions are usually irregular.   The contractions are often felt in the front of the lower abdomen and in the groin.   The contractions may go away when you walk around or change positions while lying down.   The contractions get weaker and are shorter lasting as time goes on.   The contractions do not usually become progressively stronger, regular, and closer together as with true labor.  True Labor   Contractions in true labor last 30-70 seconds, become very regular, usually become more intense, and increase in frequency.   The contractions do not go away with walking.   The discomfort is usually felt in the top of the uterus and spreads to the lower abdomen and low back.   True labor can be determined by your health care provider with an exam. This will show that the cervix is dilating and getting thinner.  WHAT TO REMEMBER  Keep up with your usual exercises and follow other instructions given by your health care provider.   Take medicines as directed by your health care provider.   Keep your regular prenatal appointments.   Eat and drink lightly if you think you are going into labor.   If Braxton Hicks contractions are making you uncomfortable:   Change your position from lying down or resting to walking, or from walking to resting.   Sit and rest in a tub of warm water.   Drink 2-3 glasses of water. Dehydration may cause these contractions.   Do slow and deep breathing several times an hour.  WHEN SHOULD I SEEK IMMEDIATE MEDICAL CARE? Seek immediate medical care if:  Your contractions become stronger, more regular, and closer together.   You have fluid leaking or gushing from your vagina.   You have a fever.   You pass blood-tinged mucus.   You have vaginal bleeding.   You have continuous abdominal pain.   You have low back pain  that you never had before.   You feel your baby's head pushing down and causing pelvic pressure.   Your baby is not moving as much as it used to.  This information is not intended to replace advice given to you by your health care provider. Make sure you discuss any questions you have with your health care provider. Document Released: 08/05/2005 Document Revised: 11/27/2015 Document Reviewed: 05/17/2013 Elsevier Interactive Patient Education  2017 Elsevier Inc.  

## 2016-08-13 NOTE — MAU Note (Signed)
Patient was seen in office today and had a non reactive NST.  Sent over for continued monitoring and BPP.  Patient reports feeling decreased fetal movement.  No LOF or vaginal bleeding.  No pain.

## 2016-08-13 NOTE — Progress Notes (Signed)
Patient has 29yr old at bedside with her, waiting for visitor to come sit with child before patient goes to US.  US to call back again for patient once visitor is here for the child. 

## 2016-08-13 NOTE — Progress Notes (Signed)
Subjective:    Cindy Cruz is a 30 y.o. female being seen today for her obstetrical visit. She is at 3524w0d gestation. Patient reports no complaints. Fetal movement: normal.  Problem List Items Addressed This Visit    Encounter for supervision of low-risk pregnancy, antepartum - Primary     Patient Active Problem List   Diagnosis Date Noted  . Biological false positive RPR test 05/14/2016  . Encounter for supervision of low-risk pregnancy, antepartum 01/17/2016  . Tobacco abuse counseling 01/17/2016    Objective:    BP 117/78   Pulse (!) 104   Temp 97.3 F (36.3 C)   Wt 184 lb 9.6 oz (83.7 kg)   LMP 11/03/2015 (LMP Unknown)   BMI 31.69 kg/m  FHT:  150 BPM  Uterine Size: size equals dates   NST:  Non Reactive  Assessment:    Pregnancy @ 4624w0d  weeks    Non Reactive NST  Plan:    Postdates management: discussed fetal surveillance and induction, discussed fetal movement, NST non reactive reactive, biophysical profile ordered. Follow up in  Week.

## 2016-08-13 NOTE — MAU Provider Note (Signed)
  History     CSN: [id number]  Arrival date and time: 08/13/16 1130   None     Chief Complaint  Patient presents with  . non reactive NST   HPI Ms Cindy Cruz is a 30yo [id number] @ 40.0wks who presents from the office [facility] for fetal testing after a non reactive NST in the office. Her preg has been [facility] by [facility] and has been remarkable for 1) smoker 2) false pos RPR titer 3) hx 15wk IUFD 4) rubella nonimmune   Past Medical History:  Diagnosis Date  . Depression    ok now  . Trichomoniasis     Past Surgical History:  Procedure Laterality Date  . WISDOM TOOTH EXTRACTION      Family History  Problem Relation Age of Onset  . Hypertension Mother   . Cancer Maternal Aunt     breast  . Hearing loss Neg Hx     Social History  Substance Use Topics  . Smoking status: Current Every Day Smoker    Packs/day: 0.50    Years: 10.00    Types: Cigarettes  . Smokeless tobacco: Never Used  . Alcohol use Yes     Comment: socially on occ.-liquor    Allergies: No Known Allergies  Prescriptions Prior to Admission  Medication Sig Dispense Refill Last Dose  . aspirin EC 81 MG tablet Take 1 tablet (81 mg total) by mouth daily. (Patient not taking: Reported on 07/29/2016) 30 tablet 6 Not Taking  . omeprazole (PRILOSEC) 20 MG capsule Take 1 capsule (20 mg total) by mouth 2 (two) times daily before a meal. 60 capsule 5 08/06/2016  . Prenatal Vit-Fe Phos-FA-Omega (VITAFOL GUMMIES) 3.33-0.333-34.8 MG CHEW CHEW 3 GUMMIES PO D  12 08/06/2016    ROS Physical Exam   Blood pressure 112/68, pulse 89, temperature 97.6 F (36.4 C), temperature source Oral, resp. rate 16, last menstrual period 11/03/2015, SpO2 97 %, unknown if currently breastfeeding.  Physical Exam  Constitutional: She is oriented to person, place, and time. She appears well-developed.  HENT:  Head: Normocephalic.  Neck: Normal range of motion.  Cardiovascular: Normal rate.   Respiratory: Effort normal.  GI:  Extended  NST: FHR 130s, +accels, no decels Rare ctx  Musculoskeletal: Normal range of motion.  Neurological: She is alert and oriented to person, place, and time.  Skin: Skin is warm and dry.  Psychiatric: She has a normal mood and affect. Her behavior is normal. Thought content normal.    MAU Course  Procedures  MDM NST  Had planned on getting a BPP but pt's young child was in the room without another adult present for 1+ hrs, at which point there was an extensive reactive NST and so the test was cancelled.  Pt then scheduled for IOL at 41.0wks  Assessment and Plan  IUP@40 .0wks Reactive NST  D/C home with labor/ROM precautions IOL scheduled for 08/20/16 at 7am  Cam HaiSHAW, KIMBERLY CNM 08/13/2016, 3:00 PM

## 2016-08-14 ENCOUNTER — Telehealth (HOSPITAL_COMMUNITY): Payer: Self-pay | Admitting: *Deleted

## 2016-08-14 NOTE — Telephone Encounter (Signed)
Preadmission screen  

## 2016-08-19 ENCOUNTER — Encounter: Payer: Self-pay | Admitting: Obstetrics and Gynecology

## 2016-08-19 NOTE — L&D Delivery Note (Signed)
Delivery Note At 10:45 PM a viable female was delivered via Vaginal, Spontaneous Delivery (LOA).   APGAR: 8, 9; weight  Pending  Placenta status: spontaneous, intact Cord: 3 vessel with the following complications: nuchal x 1   Cord pH: pending  Anesthesia: Epidural Episiotomy: None Lacerations: None Suture Repair: none Est. Blood Loss (mL): 200  Mom to postpartum.  Baby to Couplet care / Skin to Skin.  Beaulah DinningChristina M Aleza Pew 08/20/2016, 11:34 PM

## 2016-08-20 ENCOUNTER — Encounter (HOSPITAL_COMMUNITY): Payer: Self-pay

## 2016-08-20 ENCOUNTER — Inpatient Hospital Stay (HOSPITAL_COMMUNITY): Payer: Medicaid Other | Admitting: Anesthesiology

## 2016-08-20 ENCOUNTER — Inpatient Hospital Stay (HOSPITAL_COMMUNITY)
Admission: AD | Admit: 2016-08-20 | Discharge: 2016-08-22 | DRG: 775 | Disposition: A | Discharge status: Home / Self Care | Payer: Medicaid Other | Source: Ambulatory Visit | Attending: Family Medicine | Admitting: Family Medicine

## 2016-08-20 DIAGNOSIS — Z8249 Family history of ischemic heart disease and other diseases of the circulatory system: Secondary | ICD-10-CM | POA: Diagnosis not present

## 2016-08-20 DIAGNOSIS — O9081 Anemia of the puerperium: Secondary | ICD-10-CM | POA: Diagnosis not present

## 2016-08-20 DIAGNOSIS — Z3A41 41 weeks gestation of pregnancy: Secondary | ICD-10-CM

## 2016-08-20 DIAGNOSIS — O480 Post-term pregnancy: Secondary | ICD-10-CM | POA: Diagnosis present

## 2016-08-20 DIAGNOSIS — F17210 Nicotine dependence, cigarettes, uncomplicated: Secondary | ICD-10-CM | POA: Diagnosis present

## 2016-08-20 DIAGNOSIS — D649 Anemia, unspecified: Secondary | ICD-10-CM | POA: Diagnosis not present

## 2016-08-20 DIAGNOSIS — O99334 Smoking (tobacco) complicating childbirth: Secondary | ICD-10-CM | POA: Diagnosis present

## 2016-08-20 LAB — CBC
HCT: 31.9 % — ABNORMAL LOW (ref 36.0–46.0)
HEMOGLOBIN: 10.5 g/dL — AB (ref 12.0–15.0)
MCH: 27.7 pg (ref 26.0–34.0)
MCHC: 32.9 g/dL (ref 30.0–36.0)
MCV: 84.2 fL (ref 78.0–100.0)
PLATELETS: 265 10*3/uL (ref 150–400)
RBC: 3.79 MIL/uL — ABNORMAL LOW (ref 3.87–5.11)
RDW: 14.6 % (ref 11.5–15.5)
WBC: 8.2 10*3/uL (ref 4.0–10.5)

## 2016-08-20 LAB — TYPE AND SCREEN
ABO/RH(D): O POS
ANTIBODY SCREEN: NEGATIVE

## 2016-08-20 LAB — RPR: RPR Ser Ql: NONREACTIVE

## 2016-08-20 MED ORDER — OXYTOCIN BOLUS FROM INFUSION
500.0000 mL | Freq: Once | INTRAVENOUS | Status: AC
  Administered 2016-08-20: 500 mL via INTRAVENOUS

## 2016-08-20 MED ORDER — EPHEDRINE 5 MG/ML INJ
10.0000 mg | INTRAVENOUS | Status: DC | PRN
  Filled 2016-08-20: qty 4

## 2016-08-20 MED ORDER — SOD CITRATE-CITRIC ACID 500-334 MG/5ML PO SOLN
30.0000 mL | ORAL | Status: DC | PRN
  Administered 2016-08-20: 30 mL via ORAL
  Filled 2016-08-20: qty 15

## 2016-08-20 MED ORDER — TERBUTALINE SULFATE 1 MG/ML IJ SOLN
0.2500 mg | Freq: Once | INTRAMUSCULAR | Status: DC | PRN
  Filled 2016-08-20: qty 1

## 2016-08-20 MED ORDER — LACTATED RINGERS IV SOLN
INTRAVENOUS | Status: DC
  Administered 2016-08-20 (×2): via INTRAVENOUS

## 2016-08-20 MED ORDER — PHENYLEPHRINE 40 MCG/ML (10ML) SYRINGE FOR IV PUSH (FOR BLOOD PRESSURE SUPPORT)
80.0000 ug | PREFILLED_SYRINGE | INTRAVENOUS | Status: DC | PRN
  Filled 2016-08-20: qty 5
  Filled 2016-08-20: qty 10

## 2016-08-20 MED ORDER — LACTATED RINGERS IV SOLN: 500.0000 mL | Freq: Once | INTRAVENOUS | Status: DC

## 2016-08-20 MED ORDER — FENTANYL 2.5 MCG/ML BUPIVACAINE 1/10 % EPIDURAL INFUSION (WH - ANES)
14.0000 mL/h | INTRAMUSCULAR | Status: DC | PRN
  Administered 2016-08-20 (×2): 14 mL/h via EPIDURAL

## 2016-08-20 MED ORDER — LIDOCAINE HCL (PF) 1 % IJ SOLN
30.0000 mL | INTRAMUSCULAR | Status: DC | PRN
  Filled 2016-08-20: qty 30

## 2016-08-20 MED ORDER — OXYTOCIN 40 UNITS IN LACTATED RINGERS INFUSION - SIMPLE MED: 2.5000 [IU]/h | INTRAVENOUS | Status: DC

## 2016-08-20 MED ORDER — ONDANSETRON HCL 4 MG/2ML IJ SOLN: 4.0000 mg | Freq: Four times a day (QID) | INTRAMUSCULAR | Status: DC | PRN

## 2016-08-20 MED ORDER — FLEET ENEMA 7-19 GM/118ML RE ENEM: 1.0000 | ENEMA | RECTAL | Status: DC | PRN

## 2016-08-20 MED ORDER — PHENYLEPHRINE 40 MCG/ML (10ML) SYRINGE FOR IV PUSH (FOR BLOOD PRESSURE SUPPORT)
80.0000 ug | PREFILLED_SYRINGE | INTRAVENOUS | Status: DC | PRN
  Filled 2016-08-20: qty 5

## 2016-08-20 MED ORDER — OXYCODONE-ACETAMINOPHEN 5-325 MG PO TABS: 1.0000 | ORAL_TABLET | ORAL | Status: DC | PRN

## 2016-08-20 MED ORDER — FENTANYL CITRATE (PF) 100 MCG/2ML IJ SOLN
50.0000 ug | INTRAMUSCULAR | Status: DC | PRN
  Administered 2016-08-20: 100 ug via INTRAVENOUS
  Filled 2016-08-20: qty 2

## 2016-08-20 MED ORDER — FENTANYL 2.5 MCG/ML BUPIVACAINE 1/10 % EPIDURAL INFUSION (WH - ANES)
14.0000 mL/h | INTRAMUSCULAR | Status: DC | PRN
  Filled 2016-08-20: qty 100

## 2016-08-20 MED ORDER — OXYTOCIN 40 UNITS IN LACTATED RINGERS INFUSION - SIMPLE MED
1.0000 m[IU]/min | INTRAVENOUS | Status: DC
  Administered 2016-08-20: 2 m[IU]/min via INTRAVENOUS
  Filled 2016-08-20: qty 1000

## 2016-08-20 MED ORDER — LIDOCAINE HCL (PF) 1 % IJ SOLN
INTRAMUSCULAR | Status: DC | PRN
  Administered 2016-08-20 (×2): 4 mL

## 2016-08-20 MED ORDER — DIPHENHYDRAMINE HCL 50 MG/ML IJ SOLN: 12.5000 mg | INTRAMUSCULAR | Status: DC | PRN

## 2016-08-20 MED ORDER — MISOPROSTOL 25 MCG QUARTER TABLET
25.0000 ug | ORAL_TABLET | ORAL | Status: DC | PRN
  Administered 2016-08-20 (×2): 25 ug via VAGINAL
  Filled 2016-08-20: qty 1
  Filled 2016-08-20 (×2): qty 0.25

## 2016-08-20 MED ORDER — LACTATED RINGERS IV SOLN: 500.0000 mL | INTRAVENOUS | Status: DC | PRN

## 2016-08-20 MED ORDER — ACETAMINOPHEN 325 MG PO TABS: 650.0000 mg | ORAL_TABLET | ORAL | Status: DC | PRN

## 2016-08-20 MED ORDER — OXYCODONE-ACETAMINOPHEN 5-325 MG PO TABS: 2.0000 | ORAL_TABLET | ORAL | Status: DC | PRN

## 2016-08-20 NOTE — Progress Notes (Signed)
S: Patient seen & examined for progress of labor. Pain rated 6/10.     O:  Vitals:   08/20/16 1636 08/20/16 1728 08/20/16 1803 08/20/16 1839  BP: 106/66 (!) 96/54 (!) 107/51 125/71  Pulse: 99 97 (!) 102 82  Resp: 16     Temp: 98.5 F (36.9 C)     TempSrc: Oral     SpO2:      Weight:      Height:        Dilation: 3.5 Effacement (%): 50 Cervical Position: Posterior Station: -3 Presentation: Vertex Exam by:: lee   FHT: 140bpm, mod var, +accels, no decels TOCO: regular q1-572min   A/P: Pit at 1048ml/min. Next cervical check around 8:15PM Continue expectant management Anticipate SVD

## 2016-08-20 NOTE — Progress Notes (Signed)
Stopped to see Cindy Cruz and she is doing well.  Last cervical check around 0800 was 1cm. Denying any contractions at the moment. Denies pain.

## 2016-08-20 NOTE — Anesthesia Preprocedure Evaluation (Signed)
Anesthesia Evaluation  Patient identified by MRN, date of birth, ID band Patient awake    Reviewed: Allergy & Precautions, NPO status , Patient's Chart, lab work & pertinent test results  Airway Mallampati: II  TM Distance: >3 FB Neck ROM: Full    Dental no notable dental hx.    Pulmonary Current Smoker,    Pulmonary exam normal breath sounds clear to auscultation       Cardiovascular negative cardio ROS Normal cardiovascular exam Rhythm:Regular Rate:Normal     Neuro/Psych PSYCHIATRIC DISORDERS Depression negative neurological ROS     GI/Hepatic negative GI ROS, Neg liver ROS,   Endo/Other  negative endocrine ROS  Renal/GU negative Renal ROS     Musculoskeletal negative musculoskeletal ROS (+)   Abdominal   Peds  Hematology negative hematology ROS (+)   Anesthesia Other Findings   Reproductive/Obstetrics (+) Pregnancy                             Anesthesia Physical Anesthesia Plan  ASA: II  Anesthesia Plan: Epidural   Post-op Pain Management:    Induction:   Airway Management Planned:   Additional Equipment:   Intra-op Plan:   Post-operative Plan:   Informed Consent: I have reviewed the patients History and Physical, chart, labs and discussed the procedure including the risks, benefits and alternatives for the proposed anesthesia with the patient or authorized representative who has indicated his/her understanding and acceptance.     Plan Discussed with:   Anesthesia Plan Comments:         Anesthesia Quick Evaluation

## 2016-08-20 NOTE — H&P (Signed)
LABOR AND DELIVERY ADMISSION HISTORY AND PHYSICAL NOTE  Cindy Cruz is a 31 y.o. female [phone] with IUP at 76w0dby 5.3 weeks [place] I[place] for postdates.   She reports positive fetal movement. She denies leakage of fluid or vaginal bleeding.  Prenatal History/Complications:  Past Medical History: Past Medical History:  Diagnosis Date  . 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 Depression    ok now  . Trichomoniasis     Past Surgical History: Past Surgical History:  Procedure Laterality Date  . WISDOM TOOTH EXTRACTION      Obstetrical History: OB History    Gravida Para Term Preterm AB Living   6 4 4  0 1 4   SAB TAB Ectopic Multiple Live Births   0 0 0 0 4      Social History: Social History   Social History  . Marital status: Single    Spouse name: N/A  . Number of children: N/A  . Years of education: N/A   Social History Main Topics  . Smoking status: Current Every Day Smoker    Packs/day: 0.50    Years: 10.00    Types: Cigarettes  . Smokeless tobacco: Never Used  . Alcohol use Yes     Comment: socially on occ.-liquor  . Drug use: No  . Sexual activity: Yes    Birth control/ protection: None   Other Topics Concern  . None   Social History Narrative  . None    Family History: Family History  Problem Relation Age of Onset  . Hypertension Mother   . Cancer Maternal Aunt     breast  . Hearing loss Neg Hx     Allergies: No Known Allergies  Prescriptions Prior to Admission  Medication Sig Dispense Refill Last Dose  . omeprazole (PRILOSEC) 20 MG capsule Take 1 capsule (20 mg total) by mouth 2 (two) times daily before a meal. 60 capsule 5 08/06/2016  . Prenatal Vit-Fe Phos-FA-Omega (VITAFOL GUMMIES) 3.33-0.333-34.8 MG CHEW CHEW 3 GUMMIES PO D  12 08/06/2016     Review of Systems   All systems reviewed and negative except as stated in HPI  Blood pressure 110/68, pulse (!) 109, temperature 98.3 F (36.8 C),  temperature source Oral, resp. rate 16, height 5' 2"  (1.575 m), weight 181 lb (82.1 kg), last menstrual period 11/03/2015, unknown if currently breastfeeding. General appearance: alert, cooperative, appears stated age and no distress Lungs: clear to auscultation bilaterally Heart: regular rate and rhythm Abdomen: soft, non-tender; bowel sounds normal Extremities: No calf swelling or tenderness Presentation: cephalic Fetal monitoring: 135, mod var, + accels, no decels Uterine activity: None     Prenatal labs: ABO, Rh: O/Positive/-- (05/31 1058) Antibody: Negative (05/31 1058) Rubella: !Error! NON-IMMUNE RPR: Non Reactive (09/26 1054)  HBsAg: Negative (05/31 1058)  HIV: Non Reactive (09/26 1054)  GBS: Negative (11/22 0000)  2 hr Glucola: WNL Genetic screening:  Declined Anatomy [place] Normal, female  Prenatal Transfer Tool  Maternal Diabetes: No Genetic Screening: Declined Maternal Ultrasounds/Referrals: Normal Fetal Ultrasounds or other Referrals:  None Maternal Substance Abuse:  No Significant Maternal Medications:  None Significant Maternal Lab Results: Lab values include: Group B Strep negative, Other:  Rubella NON IMMUNE  No results found for this or any previous visit (from the past 24 hour(s)).  Patient Active Problem List   Diagnosis Date Noted  . Post-dates pregnancy 08/20/2016  . Encounter for supervision of low-risk pregnancy, antepartum 01/17/2016  .  Tobacco abuse counseling 01/17/2016    Assessment: Cindy Cruz is a 31 y.o. [id number] at 6w0dhere for IOL for postdates.  #Labor: IOL with cytotec, FB when able #Pain:  IV pain meds, epidural on request #FWB:  Cat I #ID:  GBS Neg; Rubella non-immune, to receive MMR vaccine after birth #MOF:  Breast #MOC: Depo #Circ:  Outpatient  EKatherine Basset DO [place]1/09/2016, 7:51 AM

## 2016-08-20 NOTE — Anesthesia Procedure Notes (Signed)
Epidural Patient location during procedure: OB Start time: 08/20/2016 9:40 PM End time: 08/20/2016 9:55 PM  Staffing Anesthesiologist: Lewie LoronGERMEROTH, Torre Pikus Performed: anesthesiologist   Preanesthetic Checklist Completed: patient identified, pre-op evaluation, timeout performed, IV checked, risks and benefits discussed and monitors and equipment checked  Epidural Patient position: sitting Prep: site prepped and draped and DuraPrep Patient monitoring: heart rate Approach: midline Location: L3-L4 Injection technique: LOR air and LOR saline  Needle:  Needle type: Tuohy  Needle gauge: 17 G Needle length: 9 cm Needle insertion depth: 5 cm Catheter type: closed end flexible Catheter size: 19 Gauge Catheter at skin depth: 11 cm Test dose: negative  Assessment Sensory level: T8 Events: blood not aspirated, injection not painful, no injection resistance, negative IV test and no paresthesia  Additional Notes Reason for block:procedure for pain

## 2016-08-20 NOTE — Progress Notes (Signed)
Patient ID: Cindy Cruz, female   DOB: 1985/10/05, 31 y.o.   MRN: [id number]  S: Patient seen & examined for progress of labor. Patient comfortable in bed.    O:  Vitals:   08/20/16 1300 08/20/16 1305 08/20/16 1310 08/20/16 1315  BP:      Pulse: 87 84 82 87  Resp:      Temp:      TempSrc:      SpO2: 100% 100% 100% 100%  Weight:      Height:        Dilation: 1.5 Effacement (%): 60 Cervical Position: Posterior Station: -3 Presentation: Vertex Exam by:: Uzziel Russey  Foley bulb placed without difficulty, infalted with 60 cc saline.  FHT: 130 bpm, mod var, +accels, one variable deceleration after a single contraction for 1 min with return to baseline and good variability. TOCO: Rare   A/P: Foley bulb placed, inflated to 60cc with saline. Next cytotec due at 4:15pm Continue expectant management Anticipate SVD

## 2016-08-20 NOTE — Anesthesia Pain Management Evaluation Note (Signed)
  CRNA Pain Management Visit Note  Patient: Cindy Cruz, 31 y.o., female  "Hello I am a member of the anesthesia team at [facility]. We have an anesthesia team available at all times to provide care throughout the hospital, including epidural management and anesthesia for C-section. I don't know your plan for the delivery whether it a natural birth, water birth, IV sedation, nitrous supplementation, doula or epidural, but we want to meet your pain goals."   1.Was your pain managed to your expectations on prior hospitalizations?   Yes   2.What is your expectation for pain management during this hospitalization?     Epidural  3.How can we help you reach that goal? epidural  Record the patient's initial score and the patient's pain goal.   Pain: 0/10  Pain Goal: 0/10  [facility] wants you to be able to say your pain was always managed very well.  Salome ArntSterling, Jaeshawn Silvio Marie 08/20/2016

## 2016-08-20 NOTE — Progress Notes (Signed)
Patient ID: Cindy Cruz, female   DOB: 08-28-1985, 31 y.o.   MRN: [id number]  S: Patient seen & examined for progress of labor. Patient uncomfortable with contractions but not ready for epidural.    O:  Vitals:   08/20/16 1728 08/20/16 1803 08/20/16 1839 08/20/16 1914  BP: (!) 96/54 (!) 107/51 125/71 114/72  Pulse: 97 (!) 102 82 90  Resp:      Temp:      TempSrc:      SpO2:      Weight:      Height:        Dilation: 5 Effacement (%): 90 Cervical Position: Posterior Station: -2 Presentation: Vertex Exam by:: Dr. Omer JackMumaw  AROM performed, large amounts of light meconium stained fluid return. Patient and baby tolerated procedure well.  FHT: 140 bpm, mod var, +accels, no decels TOCO: q2-334min    A/P: AROM - light mec Continue pitocin Continue expectant management Anticipate SVD

## 2016-08-21 ENCOUNTER — Encounter (HOSPITAL_COMMUNITY): Payer: Self-pay

## 2016-08-21 LAB — CBC
HEMATOCRIT: 27.9 % — AB (ref 36.0–46.0)
HEMOGLOBIN: 9.7 g/dL — AB (ref 12.0–15.0)
MCH: 28.6 pg (ref 26.0–34.0)
MCHC: 34.8 g/dL (ref 30.0–36.0)
MCV: 82.3 fL (ref 78.0–100.0)
Platelets: 219 10*3/uL (ref 150–400)
RBC: 3.39 MIL/uL — ABNORMAL LOW (ref 3.87–5.11)
RDW: 14.6 % (ref 11.5–15.5)
WBC: 14.2 10*3/uL — AB (ref 4.0–10.5)

## 2016-08-21 MED ORDER — TETANUS-DIPHTH-ACELL PERTUSSIS 5-2.5-18.5 LF-MCG/0.5 IM SUSP: 0.5000 mL | Freq: Once | INTRAMUSCULAR | Status: DC

## 2016-08-21 MED ORDER — ZOLPIDEM TARTRATE 5 MG PO TABS: 5.0000 mg | ORAL_TABLET | Freq: Every evening | ORAL | Status: DC | PRN

## 2016-08-21 MED ORDER — SENNOSIDES-DOCUSATE SODIUM 8.6-50 MG PO TABS
2.0000 | ORAL_TABLET | ORAL | Status: DC
  Administered 2016-08-21 – 2016-08-22 (×2): 2 via ORAL
  Filled 2016-08-21 (×2): qty 2

## 2016-08-21 MED ORDER — COCONUT OIL OIL
1.0000 "application " | TOPICAL_OIL | Status: DC | PRN
  Filled 2016-08-21 (×3): qty 120

## 2016-08-21 MED ORDER — MEASLES, MUMPS & RUBELLA VAC ~~LOC~~ INJ
0.5000 mL | INJECTION | Freq: Once | SUBCUTANEOUS | Status: AC
  Administered 2016-08-22: 0.5 mL via SUBCUTANEOUS
  Filled 2016-08-21: qty 0.5

## 2016-08-21 MED ORDER — ONDANSETRON HCL 4 MG PO TABS: 4.0000 mg | ORAL_TABLET | ORAL | Status: DC | PRN

## 2016-08-21 MED ORDER — WITCH HAZEL-GLYCERIN EX PADS: 1.0000 "application " | MEDICATED_PAD | CUTANEOUS | Status: DC | PRN

## 2016-08-21 MED ORDER — ACETAMINOPHEN 325 MG PO TABS
650.0000 mg | ORAL_TABLET | ORAL | Status: DC | PRN
  Administered 2016-08-21: 650 mg via ORAL
  Filled 2016-08-21: qty 2

## 2016-08-21 MED ORDER — DIBUCAINE 1 % RE OINT: 1.0000 "application " | TOPICAL_OINTMENT | RECTAL | Status: DC | PRN

## 2016-08-21 MED ORDER — IBUPROFEN 600 MG PO TABS
600.0000 mg | ORAL_TABLET | Freq: Four times a day (QID) | ORAL | Status: DC
  Administered 2016-08-21 – 2016-08-22 (×6): 600 mg via ORAL
  Filled 2016-08-21 (×6): qty 1

## 2016-08-21 MED ORDER — OXYCODONE-ACETAMINOPHEN 5-325 MG PO TABS: 2.0000 | ORAL_TABLET | ORAL | Status: DC | PRN

## 2016-08-21 MED ORDER — DIPHENHYDRAMINE HCL 25 MG PO CAPS: 25.0000 mg | ORAL_CAPSULE | Freq: Four times a day (QID) | ORAL | Status: DC | PRN

## 2016-08-21 MED ORDER — ONDANSETRON HCL 4 MG/2ML IJ SOLN: 4.0000 mg | INTRAMUSCULAR | Status: DC | PRN

## 2016-08-21 MED ORDER — PRENATAL MULTIVITAMIN CH
1.0000 | ORAL_TABLET | Freq: Every day | ORAL | Status: DC
  Administered 2016-08-21 – 2016-08-22 (×2): 1 via ORAL
  Filled 2016-08-21 (×2): qty 1

## 2016-08-21 MED ORDER — BENZOCAINE-MENTHOL 20-0.5 % EX AERO: 1.0000 "application " | INHALATION_SPRAY | CUTANEOUS | Status: DC | PRN

## 2016-08-21 MED ORDER — OXYCODONE-ACETAMINOPHEN 5-325 MG PO TABS
1.0000 | ORAL_TABLET | ORAL | Status: DC | PRN
  Administered 2016-08-21 – 2016-08-22 (×5): 1 via ORAL
  Filled 2016-08-21 (×6): qty 1

## 2016-08-21 MED ORDER — SIMETHICONE 80 MG PO CHEW: 80.0000 mg | CHEWABLE_TABLET | ORAL | Status: DC | PRN

## 2016-08-21 NOTE — Progress Notes (Signed)
Post Partum Day #1 Subjective: up ad lib, voiding, tolerating PO and reports difficulty with latching for breast feeding and severe cramping with back pain  Objective: Blood pressure (!) 101/52, pulse 88, temperature 98.6 F (37 C), temperature source Oral, resp. rate 16, height 5\' 2"  (1.575 m), weight 181 lb (82.1 kg), last menstrual period 11/03/2015, SpO2 100 %, unknown if currently breastfeeding.  Physical Exam:  General: alert, cooperative and no distress Lochia: appropriate Uterine Fundus: firm Incision: none DVT Evaluation: No evidence of DVT seen on physical exam. No cords or calf tenderness. No significant calf/ankle edema.   Recent Labs  08/20/16 0740 08/21/16 0505  HGB 10.5* 9.7*  HCT 31.9* 27.9*    Assessment/Plan: Plan for discharge tomorrow, Breastfeeding, Lactation consult and Contraception depo injections.  Anemia: asymptomatic, iron started.  Pain medicine ordered along with a K-pad for cramping.    LOS: 1 day   Roe CoombsRachelle A Tameca Cruz, CNM 08/21/2016, 7:16 AM

## 2016-08-21 NOTE — Lactation Note (Signed)
This note was copied from a baby's chart. Lactation Consultation Note: Mother has been bottle feeding today. She states that she pumped her breast once and didn't get anything. I offered to assist mother with latching her baby and she declined stating that infant wasn't hungry. Suggested to mother to give more time for milk to come and keep pumping every 2-3 hours. Mother states again but nothing came out. Advised mother to page for assistance if she wants to latch infant.   Patient Name: Cindy Cruz'UToday's Date: 08/21/2016     Maternal Data    Feeding Feeding Type: Formula Nipple Type: Slow - flow  LATCH Score/Interventions                      Lactation Tools Discussed/Used     Consult Status      Michel BickersKendrick, Akeia Perot McCoy 08/21/2016, 2:18 PM

## 2016-08-21 NOTE — Lactation Note (Signed)
This note was copied from a baby's chart. Lactation Consultation Note Mom's 5th child, didn't BF her other 4 children. Mom states she wants to try to BF, but will also formula feed as well. Mom has all ready given some bottles w/formula. Mom states it hurts sometimes and she doesn't love the way it feels. Mom has pendulum breast w/small everted nipple at the end of breast. Hand expression w/colostrum noted. Mom states she may pump and bottle feed. She isn't sure what she wants to do at this time. Encouraged mom to call for latching assistance to prevent and assess latching. Has BF in football position and liked it.  Mom encouraged to feed baby 8-12 times/24 hours and with feeding cues. Educated about newborn behavior, I&O, STS, cluster feeding, supply and demand.  [facility]/LC brochure given w/resources, support groups and LC services. Mom has WIC. Mom isn't sure how long she plans to BF.  Mom needs encouraged and cont. To educate to build confidence about BF.  Patient Name: Cindy Cruz ZOXWR'UToday's Date: 08/21/2016 Reason for consult: Initial assessment   Maternal Data Has patient been taught Hand Expression?: Yes Does the patient have breastfeeding experience prior to this delivery?: No  Feeding Feeding Type: Formula Nipple Type: Slow - flow Length of feed: 0 min (sleepy)  LATCH Score/Interventions       Type of Nipple: Everted at rest and after stimulation  Comfort (Breast/Nipple): Soft / non-tender           Lactation Tools Discussed/Used WIC Program: Yes   Consult Status Consult Status: Follow-up Date: 08/21/16 Follow-up type: In-patient    Cindy DancerCARVER, Cindy Cruz 08/21/2016, 6:32 AM

## 2016-08-21 NOTE — Progress Notes (Signed)
UR chart review completed.  

## 2016-08-21 NOTE — Progress Notes (Signed)
MOB was referred for history of depression/anxiety. * Referral screened out by Clinical Social Worker because none of the following criteria appear to apply: ~ History of anxiety/depression during this pregnancy, or of post-partum depression. ~ Diagnosis of anxiety and/or depression within last 3 years OR * MOB's symptoms currently being treated with medication and/or therapy. Please contact the Clinical Social Worker if needs arise, or if MOB requests.  MOB's chart notes hx of depression and "ok now," with no further concerns noted during course of prenatal care.

## 2016-08-22 MED ORDER — OXYCODONE-ACETAMINOPHEN 5-325 MG PO TABS: 1.0000 | ORAL_TABLET | ORAL | 0 refills | Status: DC | PRN

## 2016-08-22 MED ORDER — IBUPROFEN 600 MG PO TABS: 600.0000 mg | ORAL_TABLET | Freq: Four times a day (QID) | ORAL | 2 refills | Status: DC

## 2016-08-22 MED ORDER — SENNOSIDES-DOCUSATE SODIUM 8.6-50 MG PO TABS: 2.0000 | ORAL_TABLET | ORAL | 1 refills | Status: DC

## 2016-08-22 MED ORDER — MEDROXYPROGESTERONE ACETATE 150 MG/ML IM SUSP: 150.0000 mg | INTRAMUSCULAR | 4 refills | Status: DC

## 2016-08-22 NOTE — Progress Notes (Signed)
Post Partum Day #2 Subjective: no complaints, up ad lib, voiding, tolerating PO and ready for discharge home, breast feeding is going well.  Objective: Blood pressure 104/79, pulse 87, temperature 98.1 F (36.7 C), temperature source Oral, resp. rate 18, height 5\' 2"  (1.575 m), weight 181 lb (82.1 kg), last menstrual period 11/03/2015, SpO2 100 %, unknown if currently breastfeeding.  Physical Exam:  General: alert, cooperative and no distress Lochia: appropriate Uterine Fundus: firm Incision: none DVT Evaluation: No evidence of DVT seen on physical exam. No cords or calf tenderness. No significant calf/ankle edema.   Recent Labs  08/20/16 0740 08/21/16 0505  HGB 10.5* 9.7*  HCT 31.9* 27.9*    Assessment/Plan: Discharge home, Breastfeeding and Contraception Depo injections   LOS: 2 days   Cindy Cruz, CNM 08/22/2016, 8:07 AM

## 2016-08-22 NOTE — Lactation Note (Signed)
This note was copied from a baby's chart. Lactation Consultation Note  Patient Name: Cindy Cruz EXBMW'UToday's Date: 08/22/2016 Reason for consult: Follow-up assessment Mom has baby latched well in cradle hold.  C/o strong uterine cramping.  Explained to mom this is a good sign of milk let down and cramping should subside in the next few days.  Assisted with positioning baby in football hold.  Mom shown how to support and compress breast for a deeper latch.  Baby latches easily and well.  Reviewed waking techniques and breast massage to keep baby engaged and promote milk flow.  Recommended she post pump while here in the hospital to help establish milk supply.  Encouraged to call for latch assist today.  Maternal Data    Feeding Feeding Type: Breast Fed Length of feed: 10 min  LATCH Score/Interventions Latch: Grasps breast easily, tongue down, lips flanged, rhythmical sucking.  Audible Swallowing: A few with stimulation  Type of Nipple: Everted at rest and after stimulation  Comfort (Breast/Nipple): Soft / non-tender     Hold (Positioning): Assistance needed to correctly position infant at breast and maintain latch. Intervention(s): Breastfeeding basics reviewed;Support Pillows;Position options  LATCH Score: 8  Lactation Tools Discussed/Used     Consult Status      Huston FoleyMOULDEN, Ardys Hataway S 08/22/2016, 11:00 AM

## 2016-08-22 NOTE — Lactation Note (Signed)
This note was copied from a baby's chart. Lactation Consultation Note LC called to room by Rn, Mom is requesting a DEBP for home.  Mom called [facility] and has follow up appointment on Jan 9th at 2:15.  Lc advised to mom [facility] will assist with formula or pump, but not both.   LC instructed mom on use of pump kit working as double/single piston and mom also has harmony pump in room to take home. LC advised mom to let baby latch to establish a good milk supply and pump if she is offering formula. Mom is considering purchasing her own pump.  Mom will have time to decide on exact plan prior to [facility] appointment and will work on establishing a good supply.   Discussed milk transitioning to larger volume, engorgement care discussed.  Encouraged frequent feedings. Mom to soften breast as needed prior to latch.    Patient Name: Cindy Cruz Date: 08/22/2016     Maternal Data    Feeding Feeding Type: Breast Fed  LATCH Score/Interventions                      Lactation Tools Discussed/Used     Consult Status      Shoptaw, Justine Null 08/22/2016, 4:04 PM

## 2016-08-22 NOTE — Lactation Note (Signed)
This note was copied from a baby's chart. Lactation Consultation Note  Patient Name: Cindy Cruz ZOXWR'UToday's Date: 08/22/2016  Follow up visit made.  Mom states she recently fed baby on both breasts and then supplemented with formula.  She states latch went well and she feels more comfortable.  Mom getting ready to post pump.   Maternal Data    Feeding Feeding Type: Breast Fed  LATCH Score/Interventions                      Lactation Tools Discussed/Used     Consult Status      Huston FoleyMOULDEN, Adhira Jamil S 08/22/2016, 3:44 PM

## 2016-08-22 NOTE — Discharge Summary (Signed)
Obstetric Discharge Summary Reason for Admission: induction of labor and at 1971w0d Prenatal Procedures: NST and ultrasound Intrapartum Procedures: spontaneous vaginal delivery Postpartum Procedures: Rubella Ig Complications-Operative and Postpartum: none Hemoglobin  Date Value Ref Range Status  08/21/2016 9.7 (L) 12.0 - 15.0 g/dL Final   HCT  Date Value Ref Range Status  08/21/2016 27.9 (L) 36.0 - 46.0 % Final   Hematocrit  Date Value Ref Range Status  05/14/2016 33.4 (L) 34.0 - 46.6 % Final    Physical Exam:  General: alert, cooperative and no distress Lochia: appropriate Uterine Fundus: firm Incision: none DVT Evaluation: No evidence of DVT seen on physical exam. No cords or calf tenderness. No significant calf/ankle edema.  Discharge Diagnoses: Term Pregnancy-delivered  Discharge Information: Date: 08/22/2016 Activity: pelvic rest Diet: routine Medications: PNV, Ibuprofen, Colace and Percocet Condition: stable Instructions: refer to practice specific booklet Discharge to: home Follow-up Information    Sura Canul A Margarite Vessel, CNM Follow up in 4 week(s).   Specialty:  Certified Nurse Midwife Why:  Postpartum exam/depo injection Contact information: [address] [place] [id number] [phone]           Newborn Data: Live born female  Birth Weight: 7 lb 14.1 oz (3575 g) APGAR: 8, 9  Home with mother.  Roe Coombsachelle A Koralee Wedeking, CNM 08/22/2016, 8:12 AM

## 2016-08-26 NOTE — Anesthesia Postprocedure Evaluation (Signed)
Anesthesia Post Note  Patient: Cindy Cruz  Procedure(s) Performed: * No procedures listed *  Patient location during evaluation: Mother Baby Anesthesia Type: Epidural Level of consciousness: awake and alert Pain management: pain level controlled Vital Signs Assessment: post-procedure vital signs reviewed and stable Respiratory status: spontaneous breathing, nonlabored ventilation and respiratory function stable Cardiovascular status: stable Postop Assessment: no headache, no backache and epidural receding Anesthetic complications: no       Last Vitals: There were no vitals filed for this visit.  Last Pain: There were no vitals filed for this visit.               Lewie LoronJohn Garison Genova

## 2016-10-14 ENCOUNTER — Ambulatory Visit: Payer: Medicaid Other | Admitting: Obstetrics

## 2017-03-21 IMAGING — US US OB COMP LESS 14 WK
1 series · 15 of 28 positions shown · non-contrast
Comparison: Ultrasound May 18, 2014.

CLINICAL DATA: First trimester of pregnancy, acute lower abdominal
pain.

EXAM:
OBSTETRIC <14 WK US AND TRANSVAGINAL OB US
TECHNIQUE: Both transabdominal and transvaginal ultrasound examinations were
performed for complete evaluation of the gestation as well as the
maternal uterus, adnexal regions, and pelvic cul-de-sac.
Transvaginal technique was performed to assess early pregnancy.

[Series 2: us ob comp less 14 wk · 15 of 42 slices shown]
[im 1/42]
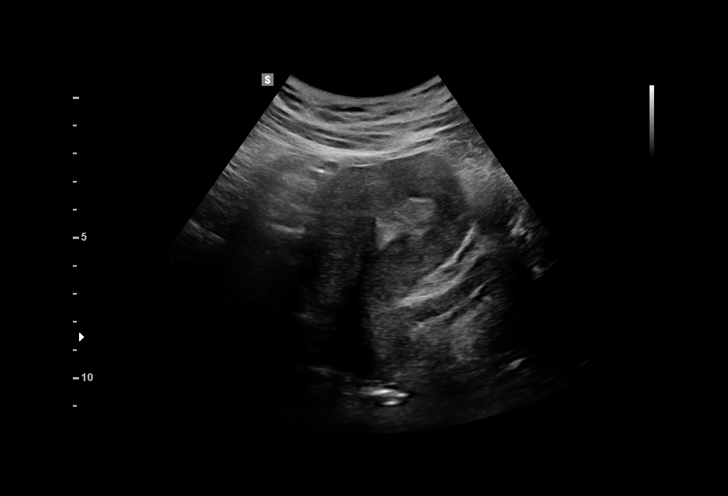
[im 4/42]
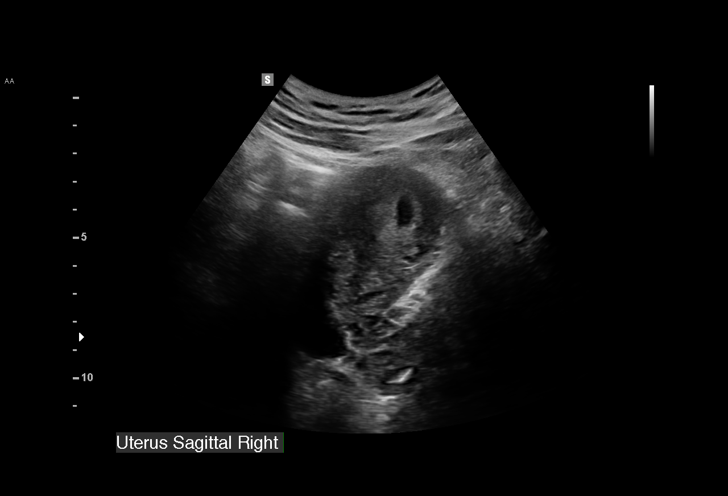
[im 7/42]
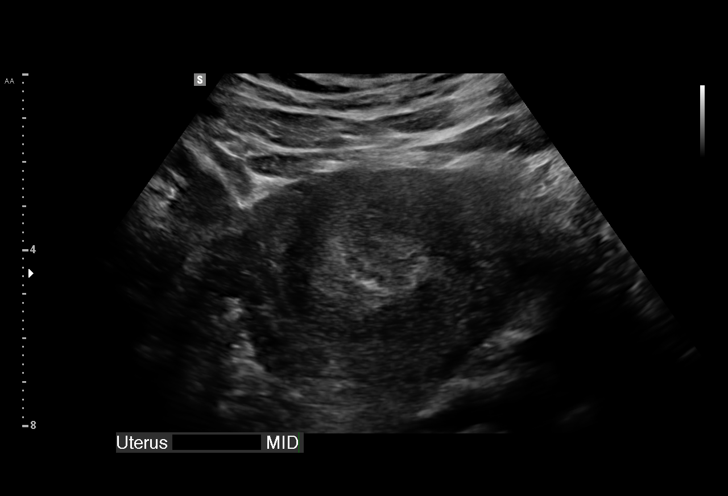
[im 10/42]
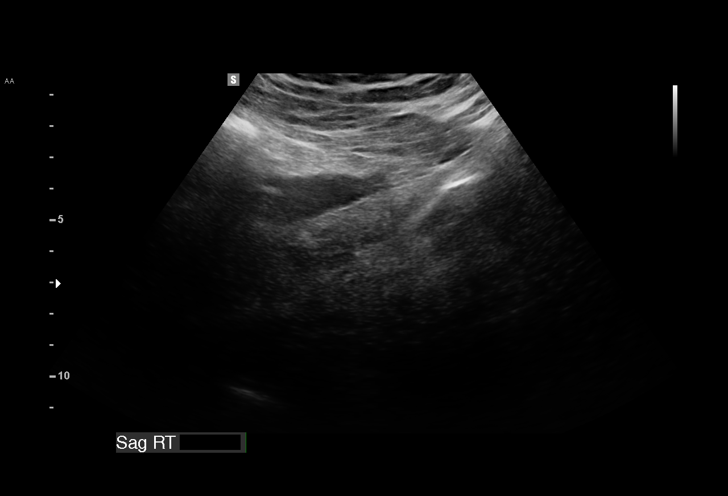
[im 13/42]
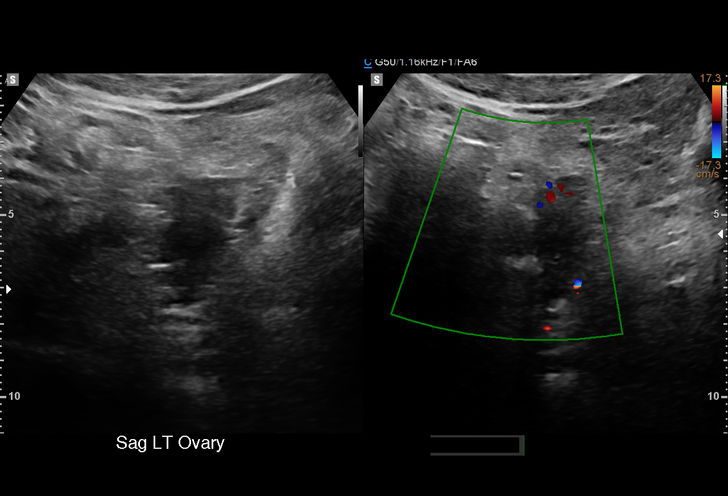
[im 16/42]
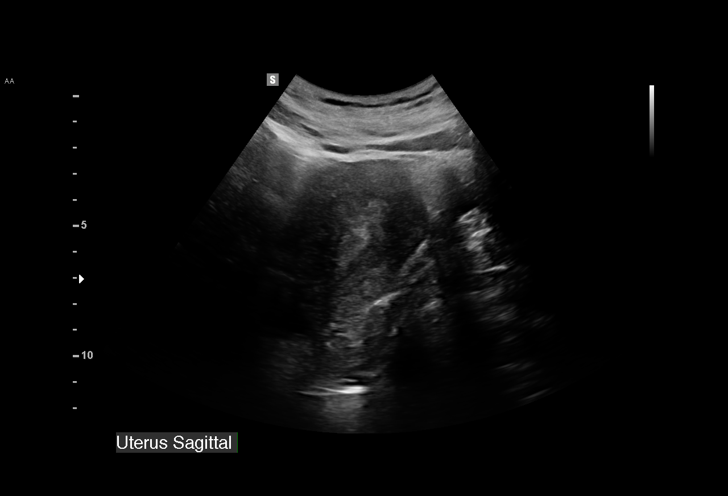
[im 19/42]
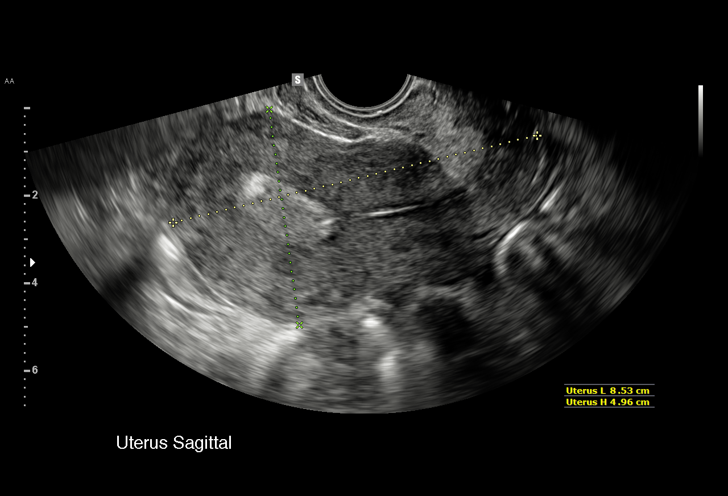
[im 22/42]
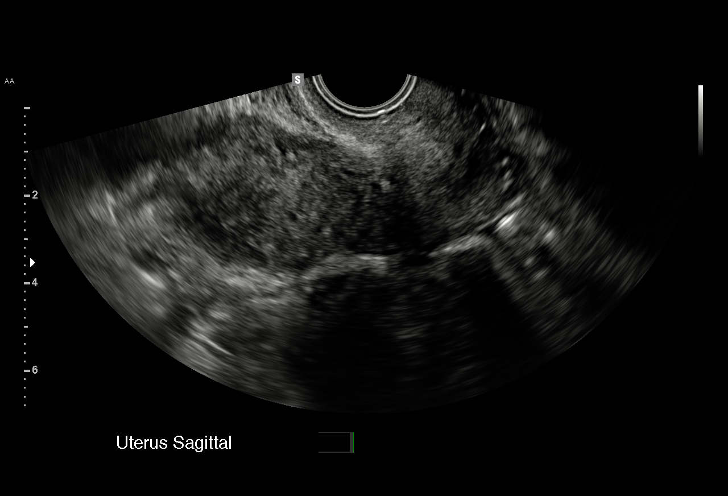
[im 23/42]
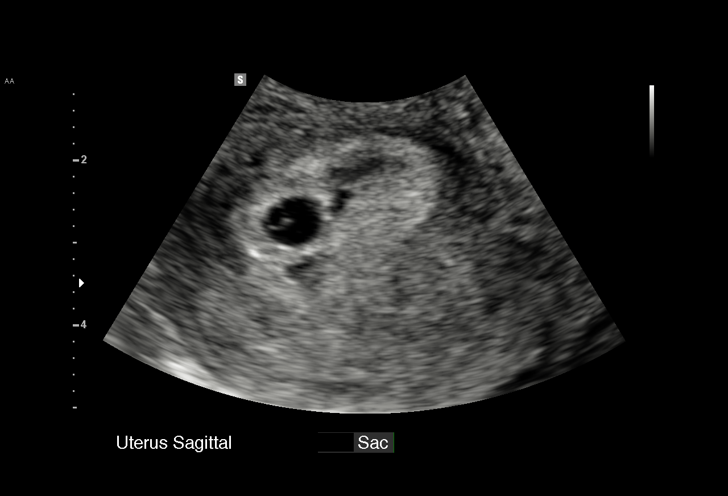
[im 26/42]
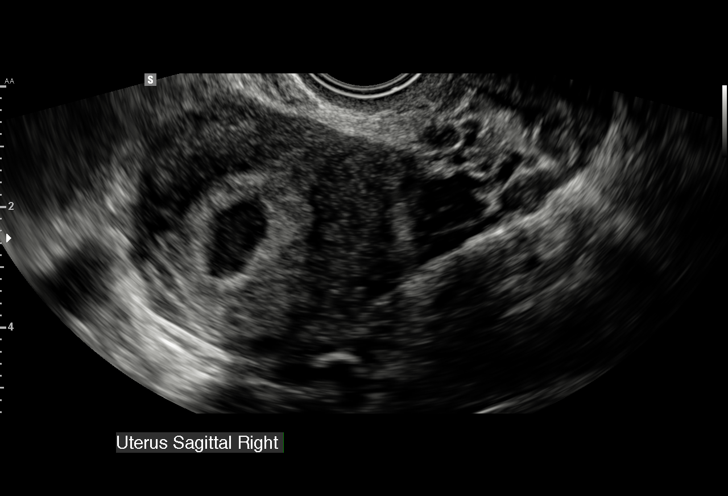
[im 29/42]
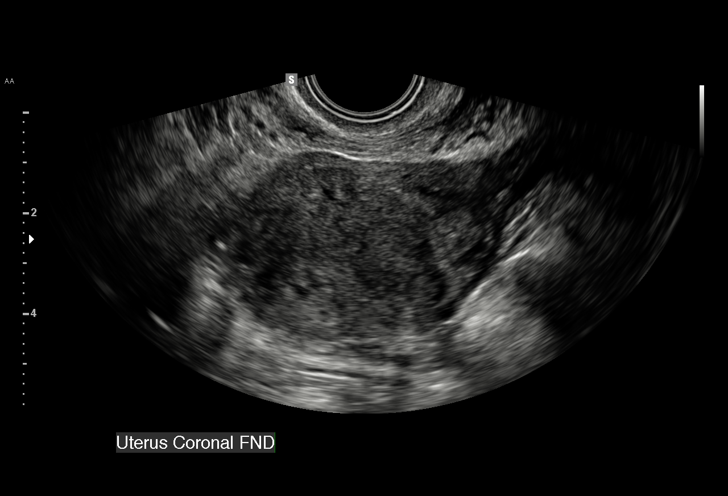
[im 32/42]
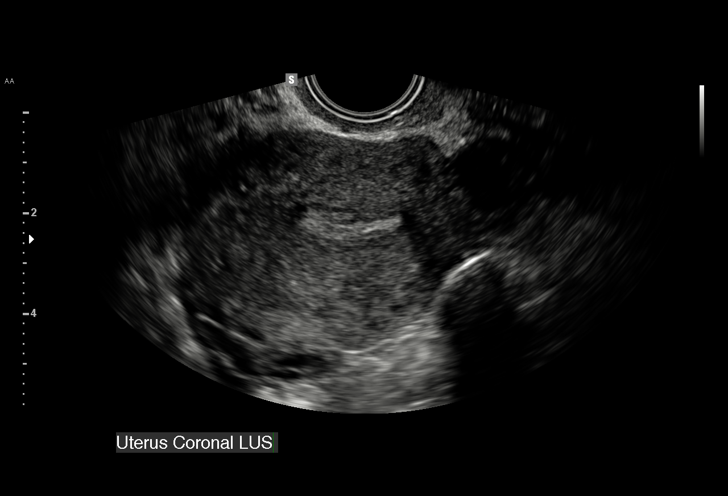
[im 35/42]
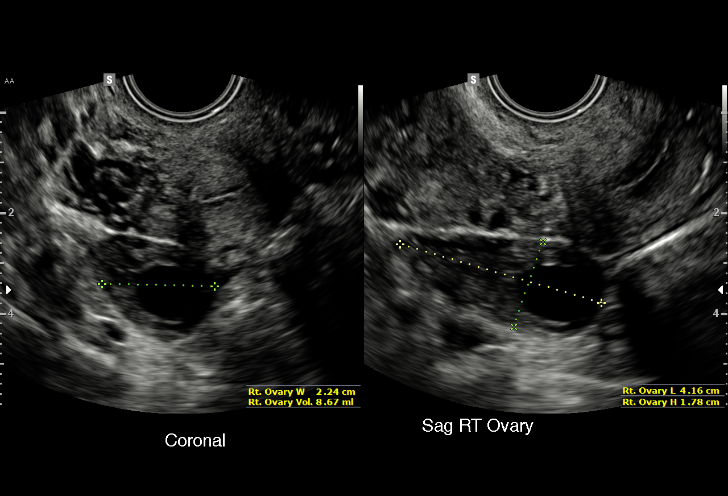
[im 38/42]
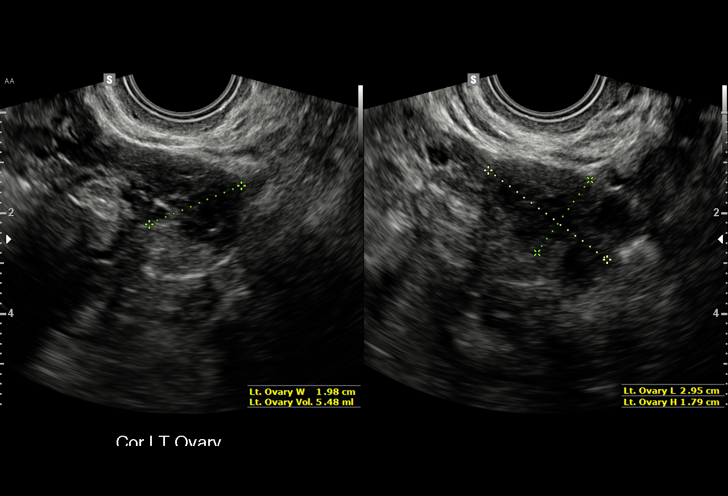
[im 42/42]
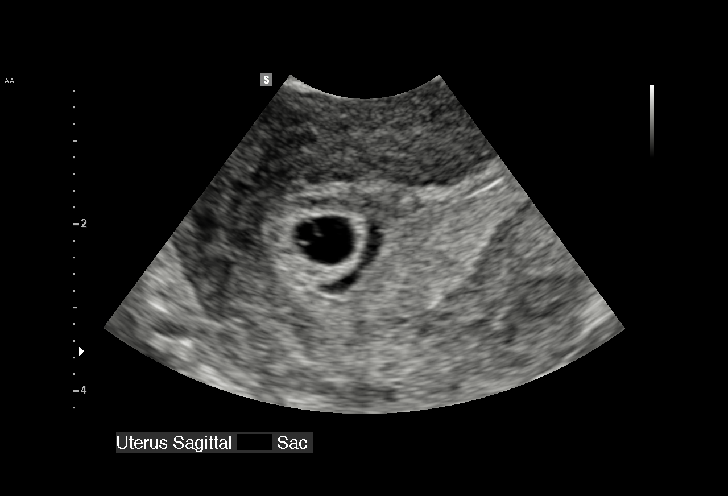

[15 of 28 positions shown; findings below may reference images not displayed]

FINDINGS: Intrauterine gestational sac: Single.

Yolk sac:  Visualized.

Embryo:  Not visualized.

Cardiac Activity: Not visualized.

MSD: 6.8  mm   5 w   3  d

Subchorionic hemorrhage:  Moderate subchorionic hemorrhage is noted.

Maternal uterus/adnexae: Ovaries appear normal. No free fluid is
noted.
IMPRESSION: Moderate subchorionic hemorrhage is noted. Probable early
intrauterine gestational sac with yolk sac, but no fetal pole or
cardiac activity yet visualized. Recommend follow-up quantitative
B-HCG levels and follow-up US in 14 days to confirm and assess
viability. This recommendation follows SRU consensus guidelines:
Diagnostic Criteria for Nonviable Pregnancy Early in the First
Trimester. N Engl J Med 1911; [DATE].

## 2017-11-04 ENCOUNTER — Encounter (HOSPITAL_COMMUNITY): Payer: Self-pay | Admitting: Emergency Medicine

## 2017-11-04 ENCOUNTER — Ambulatory Visit (HOSPITAL_COMMUNITY)
Admission: EM | Admit: 2017-11-04 | Discharge: 2017-11-04 | Disposition: A | Discharge status: Home / Self Care | Payer: Medicaid Other | Attending: Family Medicine | Admitting: Family Medicine

## 2017-11-04 DIAGNOSIS — J40 Bronchitis, not specified as acute or chronic: Secondary | ICD-10-CM

## 2017-11-04 MED ORDER — FLUTICASONE PROPIONATE 50 MCG/ACT NA SUSP: 2.0000 | Freq: Every day | NASAL | 0 refills | Status: DC

## 2017-11-04 MED ORDER — BENZONATATE 100 MG PO CAPS: 100.0000 mg | ORAL_CAPSULE | Freq: Three times a day (TID) | ORAL | 0 refills | Status: DC

## 2017-11-04 MED ORDER — IPRATROPIUM BROMIDE 0.06 % NA SOLN: 2.0000 | Freq: Four times a day (QID) | NASAL | 0 refills | Status: DC

## 2017-11-04 MED ORDER — MELOXICAM 7.5 MG PO TABS: 7.5000 mg | ORAL_TABLET | Freq: Every day | ORAL | 0 refills | Status: DC

## 2017-11-04 MED ORDER — PREDNISONE 20 MG PO TABS: 40.0000 mg | ORAL_TABLET | Freq: Every day | ORAL | 0 refills | Status: AC

## 2017-11-04 MED ORDER — AZITHROMYCIN 250 MG PO TABS: 250.0000 mg | ORAL_TABLET | Freq: Every day | ORAL | 0 refills | Status: DC

## 2017-11-04 NOTE — Discharge Instructions (Signed)
Azithromycin and prednisone for bronchitis and cough. Tessalon for cough. Mobic for pain. Start flonase, atrovent nasal spray for nasal congestion/drainage. You can use over the counter nasal saline rinse such as neti pot for nasal congestion. Keep hydrated, your urine should be clear to pale yellow in color. Tylenol/motrin for fever and pain. Monitor for any worsening of symptoms, chest pain, shortness of breath, wheezing, swelling of the throat, follow up for reevaluation.   For sore throat try using a honey-based tea. Use 3 teaspoons of honey with juice squeezed from half lemon. Place shaved pieces of ginger into 1/2-1 cup of water and warm over stove top. Then mix the ingredients and repeat every 4 hours as needed.

## 2017-11-04 NOTE — ED Provider Notes (Signed)
[facility]    CSN: [id number] Arrival date & time: 11/04/17  1846     History   Chief Complaint Chief Complaint  Patient presents with  . Cough    HPI Cindy Cruz is a 32 y.o. female.   32 year old female comes in for 10-day history of URI symptoms.  Has a productive cough, rhinorrhea, nasal congestion.  States has rib pain due to cough.  had subjective fever that has since resolved.  OTC cold medication without relief.  Current some day smoker, 1 pack last for about 3 weeks.  10-year smoking history.      Past Medical History:  Diagnosis Date  . 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 Depression    ok now  . Trichomoniasis     Patient Active Problem List   Diagnosis Date Noted  . Post-dates pregnancy 08/20/2016  . Encounter for supervision of low-risk pregnancy, antepartum 01/17/2016  . Tobacco abuse counseling 01/17/2016    Past Surgical History:  Procedure Laterality Date  . WISDOM TOOTH EXTRACTION      OB History    Gravida Para Term Preterm AB Living   6 5 5  0 1 5   SAB TAB Ectopic Multiple Live Births   0 0 0 0 5       Home Medications    Prior to Admission medications   Medication Sig Start Date End Date Taking? Authorizing Provider  azithromycin (ZITHROMAX) 250 MG tablet Take 1 tablet (250 mg total) by mouth daily. Take first 2 tablets together, then 1 every day until finished. 11/04/17   Cathie Hoops, Amy V, PA-C  benzonatate (TESSALON) 100 MG capsule Take 1 capsule (100 mg total) by mouth every 8 (eight) hours. 11/04/17   Cathie Hoops, Amy V, PA-C  fluticasone (FLONASE) 50 MCG/ACT nasal spray Place 2 sprays into both nostrils daily. 11/04/17   Cathie Hoops, Amy V, PA-C  ibuprofen (ADVIL,MOTRIN) 600 MG tablet Take 1 tablet (600 mg total) by mouth every 6 (six) hours. 08/22/16   Orvilla Cornwall A, CNM  ipratropium (ATROVENT) 0.06 % nasal spray Place 2 sprays into both nostrils 4 (four) times daily. 11/04/17   Cathie Hoops, Amy V, PA-C    medroxyPROGESTERone (DEPO-PROVERA) 150 MG/ML injection Inject 1 mL (150 mg total) into the muscle every 3 (three) months. Bring to the office for injection. 08/22/16   Orvilla Cornwall A, CNM  meloxicam (MOBIC) 7.5 MG tablet Take 1 tablet (7.5 mg total) by mouth daily. 11/04/17   Cathie Hoops, Amy V, PA-C  oxyCODONE-acetaminophen (PERCOCET/ROXICET) 5-325 MG tablet Take 1-2 tablets by mouth every 4 (four) hours as needed for severe pain (pain scale greater than 7). 08/22/16   Orvilla Cornwall A, CNM  predniSONE (DELTASONE) 20 MG tablet Take 2 tablets (40 mg total) by mouth daily for 5 days. 11/04/17 11/09/17  Belinda Fisher, PA-C  Prenatal Vit-Fe Fumarate-FA (PRENATAL MULTIVITAMIN) TABS tablet Take 1 tablet by mouth daily at 12 noon.    [provider]  senna-docusate (SENOKOT-S) 8.6-50 MG tablet Take 2 tablets by mouth daily. 08/23/16   Roe Coombs, CNM    Family History Family History  Problem Relation Age of Onset  . Hypertension Mother   . Cancer Maternal Aunt        breast  . Hearing loss Neg Hx     Social History Social History   Tobacco Use  . Smoking status: Current Every Day Smoker    Packs/day: 0.50    Years:  10.00    Pack years: 5.00    Types: Cigarettes  . Smokeless tobacco: Never Used  Substance Use Topics  . Alcohol use: Yes    Comment: socially on occ.-liquor  . Drug use: No     Allergies   Patient has no known allergies.   Review of Systems Review of Systems  Reason unable to perform ROS: See HPI as above.     Physical Exam Triage Vital Signs ED Triage Vitals [11/04/17 1926]  Enc Vitals Group     BP 132/70     Pulse Rate 89     Resp 18     Temp 98.3 F (36.8 C)     Temp Source Oral     SpO2 100 %     Weight      Height      Head Circumference      Peak Flow      Pain Score      Pain Loc      Pain Edu?      Excl. in GC?    No data found.  Updated Vital Signs BP 132/70 (BP Location: Left Arm)   Pulse 89   Temp 98.3 F (36.8 C) (Oral)    Resp 18   SpO2 100%   Physical Exam  Constitutional: She is oriented to person, place, and time. She appears well-developed and well-nourished. No distress.  HENT:  Head: Normocephalic and atraumatic.  Right Ear: Tympanic membrane, external ear and ear canal normal. Tympanic membrane is not erythematous and not bulging.  Left Ear: Tympanic membrane, external ear and ear canal normal. Tympanic membrane is not erythematous and not bulging.  Nose: Mucosal edema and rhinorrhea present. Right sinus exhibits no maxillary sinus tenderness and no frontal sinus tenderness. Left sinus exhibits no maxillary sinus tenderness and no frontal sinus tenderness.  Mouth/Throat: Uvula is midline, oropharynx is clear and moist and mucous membranes are normal.  Eyes: Conjunctivae are normal. Pupils are equal, round, and reactive to light.  Neck: Normal range of motion. Neck supple.  Cardiovascular: Normal rate, regular rhythm and normal heart sounds. Exam reveals no gallop and no friction rub.  No murmur heard. Pulmonary/Chest: Effort normal and breath sounds normal. She has no decreased breath sounds. She has no wheezes. She has no rhonchi. She has no rales.  Lymphadenopathy:    She has no cervical adenopathy.  Neurological: She is alert and oriented to person, place, and time.  Skin: Skin is warm and dry.  Psychiatric: She has a normal mood and affect. Her behavior is normal. Judgment normal.     UC Treatments / Results  Labs (all labs ordered are listed, but only abnormal results are displayed) Labs Reviewed - No data to display  EKG  EKG Interpretation None       Radiology No results found.  Procedures Procedures (including critical care time)  Medications Ordered in UC Medications - No data to display   Initial Impression / Assessment and Plan / UC Course  I have reviewed the triage vital signs and the nursing notes.  Pertinent labs & imaging results that were available during my  care of the patient were reviewed by me and considered in my medical decision making (see chart for details).    Azithromycin and prednisone for bronchitis.  Other symptomatic treatment discussed.  Push fluids.  Return precautions given.  Patient expresses understanding and agrees to plan.  Final Clinical Impressions(s) / UC Diagnoses   Final diagnoses:  Bronchitis    ED Discharge Orders        Ordered    azithromycin (ZITHROMAX) 250 MG tablet  Daily     11/04/17 1958    predniSONE (DELTASONE) 20 MG tablet  Daily     11/04/17 1958    benzonatate (TESSALON) 100 MG capsule  Every 8 hours     11/04/17 1958    meloxicam (MOBIC) 7.5 MG tablet  Daily     11/04/17 1958    ipratropium (ATROVENT) 0.06 % nasal spray  4 times daily     11/04/17 1959    fluticasone (FLONASE) 50 MCG/ACT nasal spray  Daily     11/04/17 1959        Belinda FisherYu, Amy V, PA-C 11/04/17 2010

## 2017-11-04 NOTE — ED Triage Notes (Signed)
Pt sts cough and pain with cough  

## 2017-12-16 ENCOUNTER — Inpatient Hospital Stay (HOSPITAL_COMMUNITY)
Admission: AD | Admit: 2017-12-16 | Discharge: 2017-12-16 | Disposition: A | Discharge status: Home / Self Care | Payer: Medicaid Other | Source: Ambulatory Visit | Attending: Obstetrics and Gynecology | Admitting: Obstetrics and Gynecology

## 2017-12-16 ENCOUNTER — Inpatient Hospital Stay (HOSPITAL_COMMUNITY): Payer: Medicaid Other

## 2017-12-16 ENCOUNTER — Encounter (HOSPITAL_COMMUNITY): Payer: Self-pay

## 2017-12-16 DIAGNOSIS — F17210 Nicotine dependence, cigarettes, uncomplicated: Secondary | ICD-10-CM | POA: Insufficient documentation

## 2017-12-16 DIAGNOSIS — O26891 Other specified pregnancy related conditions, first trimester: Secondary | ICD-10-CM

## 2017-12-16 DIAGNOSIS — R1030 Lower abdominal pain, unspecified: Secondary | ICD-10-CM | POA: Diagnosis present

## 2017-12-16 DIAGNOSIS — B9689 Other specified bacterial agents as the cause of diseases classified elsewhere: Secondary | ICD-10-CM

## 2017-12-16 DIAGNOSIS — N760 Acute vaginitis: Secondary | ICD-10-CM

## 2017-12-16 DIAGNOSIS — Z3A01 Less than 8 weeks gestation of pregnancy: Secondary | ICD-10-CM | POA: Diagnosis not present

## 2017-12-16 DIAGNOSIS — O23591 Infection of other part of genital tract in pregnancy, first trimester: Secondary | ICD-10-CM | POA: Insufficient documentation

## 2017-12-16 DIAGNOSIS — O99331 Smoking (tobacco) complicating pregnancy, first trimester: Secondary | ICD-10-CM | POA: Insufficient documentation

## 2017-12-16 DIAGNOSIS — R109 Unspecified abdominal pain: Secondary | ICD-10-CM

## 2017-12-16 LAB — URINALYSIS, ROUTINE W REFLEX MICROSCOPIC
Bilirubin Urine: NEGATIVE
GLUCOSE, UA: NEGATIVE mg/dL
HGB URINE DIPSTICK: NEGATIVE
Ketones, ur: NEGATIVE mg/dL
Nitrite: NEGATIVE
PROTEIN: NEGATIVE mg/dL
SPECIFIC GRAVITY, URINE: 1.025 (ref 1.005–1.030)
pH: 6 (ref 5.0–8.0)

## 2017-12-16 LAB — CBC
HCT: 36.7 % (ref 36.0–46.0)
Hemoglobin: 12.1 g/dL (ref 12.0–15.0)
MCH: 26.7 pg (ref 26.0–34.0)
MCHC: 33 g/dL (ref 30.0–36.0)
MCV: 80.8 fL (ref 78.0–100.0)
PLATELETS: 455 10*3/uL — AB (ref 150–400)
RBC: 4.54 MIL/uL (ref 3.87–5.11)
RDW: 16 % — ABNORMAL HIGH (ref 11.5–15.5)
WBC: 10.1 10*3/uL (ref 4.0–10.5)

## 2017-12-16 LAB — WET PREP, GENITAL
SPERM: NONE SEEN
Trich, Wet Prep: NONE SEEN
YEAST WET PREP: NONE SEEN

## 2017-12-16 LAB — HCG, QUANTITATIVE, PREGNANCY: HCG, BETA CHAIN, QUANT, S: 3186 m[IU]/mL — AB (ref <5)

## 2017-12-16 LAB — POCT PREGNANCY, URINE: PREG TEST UR: POSITIVE — AB

## 2017-12-16 MED ORDER — PROMETHAZINE HCL 25 MG PO TABS: 25.0000 mg | ORAL_TABLET | Freq: Four times a day (QID) | ORAL | 0 refills | Status: DC | PRN

## 2017-12-16 MED ORDER — METRONIDAZOLE 500 MG PO TABS: 500.0000 mg | ORAL_TABLET | Freq: Two times a day (BID) | ORAL | 0 refills | Status: DC

## 2017-12-16 MED ORDER — PRENATAL VITAMIN PLUS LOW IRON 27-1 MG PO TABS: 1.0000 | ORAL_TABLET | Freq: Every day | ORAL | 11 refills | Status: AC

## 2017-12-16 NOTE — Discharge Instructions (Signed)
No smoking, no drugs, no alcohol.   Take a prenatal vitamin one by mouth every day.   Eat small frequent snacks to avoid nausea.   Begin prenatal care as soon as possible. Get your medication from your pharmacy to treat the bacterial vaginosis. Use the 1-800 QUIT NOW to help you stop smoking. Take Tylenol 325 mg 2 tablets by mouth every 4 hours if needed for pain. Drink at least 8 8-oz glasses of water every day.

## 2017-12-16 NOTE — MAU Provider Note (Signed)
History     CSN: [id number]  Arrival date and time: 12/16/17 4098   First Provider Initiated Contact with Patient 12/16/17 2015      Chief Complaint  Patient presents with  . Possible Pregnancy  . Abdominal Pain   HPI Cindy Cruz 32 y.o. [redacted]w[redacted]d  Has had lower abdominal cramping for 2 weeks at 7/10 on the pain scale.  LMP was 11-05-17 and had a positive home pregnancy test.  Had a baby in January 2018.  Denies any vaginal bleeding.  Plans to get prenatal care at [facility].  Has had a second trimester pregnancy loss so was concerned about this pregnancy.  OB History    Gravida  6   Para  5   Term  5   Preterm  0   AB  1   Living  5     SAB  0   TAB  0   Ectopic  0   Multiple  0   Live Births  5           Past Medical History:  Diagnosis Date  . 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 Depression    ok now  . Trichomoniasis     Past Surgical History:  Procedure Laterality Date  . WISDOM TOOTH EXTRACTION      Family History  Problem Relation Age of Onset  . Hypertension Mother   . Cancer Maternal Aunt        breast  . Hearing loss Neg Hx     Social History   Tobacco Use  . Smoking status: Current Every Day Smoker    Packs/day: 0.25    Years: 10.00    Pack years: 2.50    Types: Cigarettes  . Smokeless tobacco: Never Used  Substance Use Topics  . Alcohol use: Yes    Comment: socially on occ.-liquor  . Drug use: No    Allergies: No Known Allergies  Medications Prior to Admission  Medication Sig Dispense Refill Last Dose  . azithromycin (ZITHROMAX) 250 MG tablet Take 1 tablet (250 mg total) by mouth daily. Take first 2 tablets together, then 1 every day until finished. 6 tablet 0   . benzonatate (TESSALON) 100 MG capsule Take 1 capsule (100 mg total) by mouth every 8 (eight) hours. 21 capsule 0   . fluticasone (FLONASE) 50 MCG/ACT nasal spray Place 2 sprays into both nostrils daily. 1 g 0   . ibuprofen (ADVIL,MOTRIN)  600 MG tablet Take 1 tablet (600 mg total) by mouth every 6 (six) hours. 120 tablet 2   . ipratropium (ATROVENT) 0.06 % nasal spray Place 2 sprays into both nostrils 4 (four) times daily. 15 mL 0   . medroxyPROGESTERone (DEPO-PROVERA) 150 MG/ML injection Inject 1 mL (150 mg total) into the muscle every 3 (three) months. Bring to the office for injection. 1 mL 4   . meloxicam (MOBIC) 7.5 MG tablet Take 1 tablet (7.5 mg total) by mouth daily. 15 tablet 0   . oxyCODONE-acetaminophen (PERCOCET/ROXICET) 5-325 MG tablet Take 1-2 tablets by mouth every 4 (four) hours as needed for severe pain (pain scale greater than 7). 30 tablet 0   . Prenatal Vit-Fe Fumarate-FA (PRENATAL MULTIVITAMIN) TABS tablet Take 1 tablet by mouth daily at 12 noon.   08/19/2016 at Unknown time  . senna-docusate (SENOKOT-S) 8.6-50 MG tablet Take 2 tablets by mouth daily. 90 tablet 1     Review of Systems  Constitutional: Negative  for fever.  Gastrointestinal: Positive for abdominal pain. Negative for diarrhea, nausea and vomiting.  Genitourinary: Negative for dysuria.  Musculoskeletal: Negative for back pain.   Physical Exam   Blood pressure (!) 126/47, pulse 85, temperature 99 F (37.2 C), temperature source Oral, resp. rate 16, height  (1.651 m), weight 192 lb (87.1 kg), last menstrual period 11/05/2017, SpO2 100 %, unknown if currently breastfeeding.  Physical Exam  Nursing note and vitals reviewed. Constitutional: She is oriented to person, place, and time. She appears well-developed and well-nourished.  HENT:  Head: Normocephalic.  Eyes: EOM are normal.  Neck: Neck supple.  GI: Soft. There is no tenderness. There is no rebound and no guarding.  Genitourinary:  Genitourinary Comments: Speculum exam: Vagina - Small amount of white frothy discharge, no odor Cervix - No contact bleeding Bimanual exam: Cervix closed Uterus non tender, unable to size due to habitus Adnexa non tender, exam limited due to  habitus GC/Chlam, wet prep done Chaperone present for exam.   Musculoskeletal: Normal range of motion.  Neurological: She is alert and oriented to person, place, and time.  Skin: Skin is warm and dry.  Psychiatric: She has a normal mood and affect.    MAU Course  Procedures Results for orders placed or performed during the hospital encounter of 12/16/17 (from the past 24 hour(s))  Urinalysis, Routine w reflex microscopic     Status: Abnormal   Collection Time: 12/16/17  7:27 PM  Result Value Ref Range   Color, Urine YELLOW YELLOW   APPearance CLEAR CLEAR   Specific Gravity, Urine 1.025 1.005 - 1.030   pH 6.0 5.0 - 8.0   Glucose, UA NEGATIVE NEGATIVE mg/dL   Hgb urine dipstick NEGATIVE NEGATIVE   Bilirubin Urine NEGATIVE NEGATIVE   Ketones, ur NEGATIVE NEGATIVE mg/dL   Protein, ur NEGATIVE NEGATIVE mg/dL   Nitrite NEGATIVE NEGATIVE   Leukocytes, UA TRACE (A) NEGATIVE   RBC / HPF 0-5 0 - 5 RBC/hpf   WBC, UA 6-10 0 - 5 WBC/hpf   Bacteria, UA RARE (A) NONE SEEN   Squamous Epithelial / LPF 0-5 0 - 5   Mucus PRESENT   Pregnancy, urine POC     Status: Abnormal   Collection Time: 12/16/17  8:20 PM  Result Value Ref Range   Preg Test, Ur POSITIVE (A) NEGATIVE  CBC     Status: Abnormal   Collection Time: 12/16/17  8:42 PM  Result Value Ref Range   WBC 10.1 4.0 - 10.5 K/uL   RBC 4.54 3.87 - 5.11 MIL/uL   Hemoglobin 12.1 12.0 - 15.0 g/dL   HCT 16.1 09.6 - 04.5 %   MCV 80.8 78.0 - 100.0 fL   MCH 26.7 26.0 - 34.0 pg   MCHC 33.0 30.0 - 36.0 g/dL   RDW 40.9 (H) 81.1 - 91.4 %   Platelets 455 (H) 150 - 400 K/uL  Wet prep, genital     Status: Abnormal   Collection Time: 12/16/17  8:50 PM  Result Value Ref Range   Yeast Wet Prep HPF POC NONE SEEN NONE SEEN   Trich, Wet Prep NONE SEEN NONE SEEN   Clue Cells Wet Prep HPF POC PRESENT (A) NONE SEEN   WBC, Wet Prep HPF POC MODERATE (A) NONE SEEN   Sperm NONE SEEN     MDM Preliminary [place] results reviewed - IUGS with yolk sac but no  embryo - definitive for IUP and this is not an ectopic pregnancy. Discussed results with  client  And reviewed the progression of pregnancy - she is early pregnant and the baby has not yet formed..  She plans to [facility] an appoit[facility]ment at [facility].  Assessment and Plan  Abdominal pain in pregnancy, first trimester - unknown cause Bacterial vaginosis Early IUP Current smoker  Plan No smoking, no drugs, no alcohol.   Take a prenatal vitamin one by mouth every day.   Eat small frequent snacks to avoid nausea.   Begin prenatal care as soon as possible. Get your medication from your pharmacy to treat the bacterial vaginosis. Use [id number]-QUIT-[id number] to stop all smoking.   Terri L Burleson 12/16/2017, 8:19 PM

## 2017-12-16 NOTE — MAU Note (Signed)
Pt reports lower abdominal pain that started 2 weeks ago. States the pain is crampy in nature. Pt denies vaginal bleeding or discharge. Pt has not taken taken anything for pain. Rates 7/10. LMP: around the week of March 20th. Pt states she had a +upt last week at home.

## 2017-12-17 LAB — HIV ANTIBODY (ROUTINE TESTING W REFLEX): HIV Screen 4th Generation wRfx: NONREACTIVE

## 2017-12-17 LAB — GC/CHLAMYDIA PROBE AMP (~~LOC~~) NOT AT ARMC
Chlamydia: NEGATIVE
Neisseria Gonorrhea: NEGATIVE

## 2017-12-17 LAB — RPR: RPR: NONREACTIVE

## 2018-01-21 DIAGNOSIS — Z3480 Encounter for supervision of other normal pregnancy, unspecified trimester: Secondary | ICD-10-CM | POA: Insufficient documentation

## 2018-01-22 ENCOUNTER — Encounter: Payer: Medicaid Other | Admitting: Certified Nurse Midwife

## 2018-01-29 ENCOUNTER — Encounter: Payer: Medicaid Other | Admitting: Certified Nurse Midwife

## 2018-01-30 ENCOUNTER — Telehealth: Payer: Self-pay | Admitting: *Deleted

## 2018-01-30 NOTE — Telephone Encounter (Signed)
Left vmail for patent to callback and reschedule missed New OB appt on 01/29/18 or let us know her plans.Marland Kitchen..Marland Kitchen

## 2018-03-03 ENCOUNTER — Ambulatory Visit (INDEPENDENT_AMBULATORY_CARE_PROVIDER_SITE_OTHER): Payer: Medicaid Other | Admitting: Medical

## 2018-03-03 ENCOUNTER — Encounter: Payer: Self-pay | Admitting: Medical

## 2018-03-03 ENCOUNTER — Other Ambulatory Visit (HOSPITAL_COMMUNITY)
Admission: RE | Admit: 2018-03-03 | Discharge: 2018-03-03 | Disposition: A | Discharge status: Home / Self Care | Payer: Medicaid Other | Source: Ambulatory Visit | Attending: Certified Nurse Midwife | Admitting: Certified Nurse Midwife

## 2018-03-03 VITALS — BP 111/76 | HR 88 | Wt 178.4 lb

## 2018-03-03 DIAGNOSIS — Z3482 Encounter for supervision of other normal pregnancy, second trimester: Secondary | ICD-10-CM | POA: Diagnosis not present

## 2018-03-03 DIAGNOSIS — Z3481 Encounter for supervision of other normal pregnancy, first trimester: Secondary | ICD-10-CM

## 2018-03-03 DIAGNOSIS — J45909 Unspecified asthma, uncomplicated: Secondary | ICD-10-CM

## 2018-03-03 DIAGNOSIS — O99512 Diseases of the respiratory system complicating pregnancy, second trimester: Secondary | ICD-10-CM

## 2018-03-03 DIAGNOSIS — O26892 Other specified pregnancy related conditions, second trimester: Secondary | ICD-10-CM

## 2018-03-03 DIAGNOSIS — R12 Heartburn: Secondary | ICD-10-CM

## 2018-03-03 DIAGNOSIS — Z3480 Encounter for supervision of other normal pregnancy, unspecified trimester: Secondary | ICD-10-CM

## 2018-03-03 MED ORDER — PRENATAL PLUS 27-1 MG PO TABS: 1.0000 | ORAL_TABLET | Freq: Every day | ORAL | 0 refills | Status: DC

## 2018-03-03 MED ORDER — FAMOTIDINE 20 MG PO TABS: 20.0000 mg | ORAL_TABLET | Freq: Two times a day (BID) | ORAL | 3 refills | Status: DC

## 2018-03-03 NOTE — Progress Notes (Signed)
Patient is in the office for initial ob visit. Pt reports slight pressure this morning, but none now. Pt states that she has not started feeling fetal movement. FOB is currently incarcerated, unsure of release date.

## 2018-03-03 NOTE — Progress Notes (Signed)
   PRENATAL VISIT NOTE  Subjective:  Cindy Cruz is a 32 y.o. [id number] at 6148w6d being seen today for her first prenatal care visit with this pregnancy.  She is currently monitored for the following issues for this low-risk pregnancy and has Supervision of other normal pregnancy, antepartum on their problem list.  Patient reports no complaints.  Contractions: Not present. Vag. Bleeding: None.  Movement: Absent. Denies leaking of fluid.   The following portions of the patient's history were reviewed and updated as appropriate: allergies, current medications, past family history, past medical history, past social history, past surgical history and problem list. Problem list updated.  Objective:   Vitals:   03/03/18 1318  BP: 111/76  Pulse: 88  Weight: 178 lb 6.4 oz (80.9 kg)    Fetal Status: Fetal Heart Rate (bpm): 154   Movement: Absent     General:  Alert, oriented and cooperative. Patient is in no acute distress.  Skin: Skin is warm and dry. No rash noted.   Cardiovascular: Normal heart rate and rhythm noted  Respiratory: Normal respiratory effort, no problems with respiration noted. Clear to auscultation. No wheezes  Abdomen: Soft, gravid, appropriate for gestational age. Normal bowel sounds. No tenderness to palpation. Gravid uterus just below the umbilicus.   Pain/Pressure: Present     Pelvic: Cervical exam performed Dilation: Closed Effacement (%): Thick    Extremities: Normal range of motion.  Edema: None  Mental Status: Normal mood and affect. Normal behavior. Normal judgment and thought content.   Assessment and Plan:  Pregnancy: [id number] at 3548w6d  1. Supervision of other normal pregnancy, antepartum - Obstetric Panel, Including HIV - Cystic Fibrosis Mutation 97 - Culture, OB Urine - Enroll Patient in Babyscripts - Genetic Screening - Cervicovaginal ancillary only - SMN1 Copy Number Analysis - AFP, Serum, Open Spina Bifida - prenatal vitamin w/FE, FA (PRENATAL 1 +  1) 27-1 MG TABS tablet; Take 1 tablet by mouth daily at 12 noon.  Dispense: 30 each; Refill: 0 - US MFM OB DETAIL +14 WK; scheduled - Normal Hgb Electro in previous pregnancy - not repeated today - Normal pap 2017 in Epic   2. Heartburn during pregnancy in second trimester - famotidine (PEPCID) 20 MG tablet; Take 1 tablet (20 mg total) by mouth 2 (two) times daily.  Dispense: 60 tablet; Refill: 3  3. Asthma affecting pregnancy in second trimester - US MFM OB DETAIL +14 WK  Preterm labor/ second trimeter symptoms and general obstetric precautions including but not limited to vaginal bleeding, contractions, leaking of fluid and fetal movement were reviewed in detail with the patient. Please refer to After Visit Summary for other counseling recommendations.  Return in about 1 month (around 03/31/2018) for LOB.  Vonzella NippleJulie Kenyata Napier, PA-C

## 2018-03-03 NOTE — Patient Instructions (Signed)
Prenatal Care WHAT IS PRENATAL CARE? Prenatal care is the process of caring for a pregnant woman before she gives birth. Prenatal care makes sure that she and her baby remain as healthy as possible throughout pregnancy. Prenatal care may be provided by a midwife, family practice health care provider, or a childbirth and pregnancy specialist (obstetrician). Prenatal care may include physical examinations, testing, treatments, and education on nutrition, lifestyle, and social support services. WHY IS PRENATAL CARE SO IMPORTANT? Early and consistent prenatal care increases the chance that you and your baby will remain healthy throughout your pregnancy. This type of care also decreases a baby's risk of being born too early (prematurely), or being born smaller than expected (small for gestational age). Any underlying medical conditions you may have that could pose a risk during your pregnancy are discussed during prenatal care visits. You will also be monitored regularly for any new conditions that may arise during your pregnancy so they can be treated quickly and effectively. WHAT HAPPENS DURING PRENATAL CARE VISITS? Prenatal care visits may include the following: Discussion Tell your health care provider about any new signs or symptoms you have experienced since your last visit. These might include:  Nausea or vomiting.  Increased or decreased level of energy.  Difficulty sleeping.  Back or leg pain.  Weight changes.  Frequent urination.  Shortness of breath with physical activity.  Changes in your skin, such as the development of a rash or itchiness.  Vaginal discharge or bleeding.  Feelings of excitement or nervousness.  Changes in your baby's movements.  You may want to write down any questions or topics you want to discuss with your health care provider and bring them with you to your appointment. Examination During your first prenatal care visit, you will likely have a complete  physical exam. Your health care provider will often examine your vagina, cervix, and the position of your uterus, as well as check your heart, lungs, and other body systems. As your pregnancy progresses, your health care provider will measure the size of your uterus and your baby's position inside your uterus. He or she may also examine you for early signs of labor. Your prenatal visits may also include checking your blood pressure and, after about 10-12 weeks of pregnancy, listening to your baby's heartbeat. Testing Regular testing often includes:  Urinalysis. This checks your urine for glucose, protein, or signs of infection.  Blood count. This checks the levels of white and red blood cells in your body.  Tests for sexually transmitted infections (STIs). Testing for STIs at the beginning of pregnancy is routinely done and is required in many states.  Antibody testing. You will be checked to see if you are immune to certain illnesses, such as rubella, that can affect a developing fetus.  Glucose screen. Around 24-28 weeks of pregnancy, your blood glucose level will be checked for signs of gestational diabetes. Follow-up tests may be recommended.  Group B strep. This is a bacteria that is commonly found inside a woman's vagina. This test will inform your health care provider if you need an antibiotic to reduce the amount of this bacteria in your body prior to labor and childbirth.  Ultrasound. Many pregnant women undergo an ultrasound screening around 18-20 weeks of pregnancy to evaluate the health of the fetus and check for any developmental abnormalities.  HIV (human immunodeficiency virus) testing. Early in your pregnancy, you will be screened for HIV. If you are at high risk for HIV, this test may   be repeated during your third trimester of pregnancy.  You may be offered other testing based on your age, personal or family medical history, or other factors. HOW OFTEN SHOULD I PLAN TO SEE MY  HEALTH CARE PROVIDER FOR PRENATAL CARE? Your prenatal care check-up schedule depends on any medical conditions you have before, or develop during, your pregnancy. If you do not have any underlying medical conditions, you will likely be seen for checkups:  Monthly, during the first 6 months of pregnancy.  Twice a month during months 7 and 8 of pregnancy.  Weekly starting in the 9th month of pregnancy and until delivery.  If you develop signs of early labor or other concerning signs or symptoms, you may need to see your health care provider more often. Ask your health care provider what prenatal care schedule is best for you. WHAT CAN I DO TO KEEP MYSELF AND MY BABY AS HEALTHY AS POSSIBLE DURING MY PREGNANCY?  Take a prenatal vitamin containing 400 micrograms (0.4 mg) of folic acid every day. Your health care provider may also ask you to take additional vitamins such as iodine, vitamin D, iron, copper, and zinc.  Take 1500-2000 mg of calcium daily starting at your 20th week of pregnancy until you deliver your baby.  Make sure you are up to date on your vaccinations. Unless directed otherwise by your health care provider: ? You should receive a tetanus, diphtheria, and pertussis (Tdap) vaccination between the 27th and 36th week of your pregnancy, regardless of when your last Tdap immunization occurred. This helps protect your baby from whooping cough (pertussis) after he or she is born. ? You should receive an annual inactivated influenza vaccine (IIV) to help protect you and your baby from influenza. This can be done at any point during your pregnancy.  Eat a well-rounded diet that includes: ? Fresh fruits and vegetables. ? Lean proteins. ? Calcium-rich foods such as milk, yogurt, hard cheeses, and dark, leafy greens. ? Whole grain breads.  Do noteat seafood high in mercury, including: ? Swordfish. ? Tilefish. ? Shark. ? King mackerel. ? More than 6 oz tuna per week.  Do not  eat: ? Raw or undercooked meats or eggs. ? Unpasteurized foods, such as soft cheeses (brie, blue, or feta), juices, and milks. ? Lunch meats. ? Hot dogs that have not been heated until they are steaming.  Drink enough water to keep your urine clear or pale yellow. For many women, this may be 10 or more 8 oz glasses of water each day. Keeping yourself hydrated helps deliver nutrients to your baby and may prevent the start of pre-term uterine contractions.  Do not use any tobacco products including cigarettes, chewing tobacco, or electronic cigarettes. If you need help quitting, ask your health care provider.  Do not drink beverages containing alcohol. No safe level of alcohol consumption during pregnancy has been determined.  Do not use any illegal drugs. These can harm your developing baby or cause a miscarriage.  Ask your health care provider or pharmacist before taking any prescription or over-the-counter medicines, herbs, or supplements.  Limit your caffeine intake to no more than 200 mg per day.  Exercise. Unless told otherwise by your health care provider, try to get 30 minutes of moderate exercise most days of the week. Do not  do high-impact activities, contact sports, or activities with a high risk of falling, such as horseback riding or downhill skiing.  Get plenty of rest.  Avoid anything that raises your  body temperature, such as hot tubs and saunas.  If you own a cat, do not empty its litter box. Bacteria contained in cat feces can cause an infection called toxoplasmosis. This can result in serious harm to the fetus.  Stay away from chemicals such as insecticides, lead, mercury, and cleaning or paint products that contain solvents.  Do not have any X-rays taken unless medically necessary.  Take a childbirth and breastfeeding preparation class. Ask your health care provider if you need a referral or recommendation.  This information is not intended to replace advice given  to you by your health care provider. Make sure you discuss any questions you have with your health care provider. Document Released: 08/08/2003 Document Revised: 01/08/2016 Document Reviewed: 10/20/2013 Elsevier Interactive Patient Education  2017 [place] Education Options: [facility] Department Classes:  Childbirth education classes can help you get ready for a positive parenting experience. You can also meet other expectant parents and get free stuff for your baby. Each class runs for five weeks on the same night and costs $45 for the mother-to-be and her support person. Medicaid covers the cost if you are eligible. Call [phone] to register. [facility] Childbirth Education:  [phone] or [phone] or [email or web address]  Baby & Me Class: Discuss newborn & infant parenting and family adjustment issues with other new mothers in a relaxed environment. Each week brings a new speaker or baby-centered activity. We encourage new mothers to join [place] every Thursday at 11:00am. Babies birth until crawling. No registration or fee. Daddy [employer]: This course offers Dads-to-be the tools and knowledge needed to feel confident on their journey to becoming new fathers. Experienced dads, who have been trained as coaches, teach dads-to-be how to hold, comfort, diaper, swaddle and play with their infant while being able to support the new mom as well. A class for men taught by men. $25/dad Big Brother/Big Sister: Let your children share in the joy of a new brother or sister in this special class designed just for them. Class includes discussion about how families care for babies: swaddling, holding, diapering, safety as well as how they can be helpful in their new role. This class is designed for children ages 71 to 57, but any age is welcome. Please register each child individually. $5/child  Mom Talk: This mom-led group offers support and connection to mothers as  they journey through the adjustments and struggles of that sometimes overwhelming first year after the birth of a child. Tuesdays at 10:00am and Thursdays at 6:00pm. Babies welcome. No registration or fee. Breastfeeding Support Group: This group is a mother-to-mother support circle where moms have the opportunity to share their breastfeeding experiences. A Lactation Consultant is present for questions and concerns. Meets each Tuesday at 11:00am. No fee or registration. Breastfeeding Your Baby: Learn what to expect in the first days of breastfeeding your newborn.  This class will help you feel more confident with the skills needed to begin your breastfeeding experience. Many new mothers are concerned about breastfeeding after leaving the hospital. This class will also address the most common fears and challenges about breastfeeding during the first few weeks, months and beyond. (call for fee) Comfort Techniques and Tour: This 2 hour interactive class will provide you the opportunity to learn & practice hands-on techniques that can help relieve some of the discomfort of labor and encourage your baby to rotate toward the best position for birth. You and your partner will be able to try  a variety of labor positions with birth balls and rebozos as well as practice breathing, relaxation, and visualization techniques. A tour of the [facility] is included with this class. $20 per registrant and support person Childbirth Class- Weekend Option: This class is a Weekend version of our Birth & Baby series. It is designed for parents who have a difficult time fitting several weeks of classes into their schedule. It covers the care of your newborn and the basics of labor and childbirth. It also includes a [place] [facility] and lunch. The class is held two consecutive days: beginning on Friday evening from 6:30 - 8:30 p.m. and the next day, Saturday from 9 a.m. - 4 p.m.  (call for fee) Doren Custard Class: Interested in a waterbirth?  This informational class will help you discover whether waterbirth is the right fit for you. Education about waterbirth itself, supplies you would need and how to assemble your support team is what you can expect from this class. Some obstetrical practices require this class in order to pursue a waterbirth. (Not all obstetrical practices offer waterbirth-check with your healthcare provider.) Register only the expectant mom, but you are encouraged to bring your partner to class! Required if planning waterbirth, no fee. Infant/Child CPR: Parents, grandparents, babysitters, and friends learn Cardio-Pulmonary Resuscitation skills for infants and children. You will also learn how to treat both conscious and unconscious choking in infants and children. This Family & Friends program does not offer certification. Register each participant individually to ensure that enough mannequins are available. (Call for fee) Grandparent Love: Expecting a grandbaby? This class is for you! Learn about the latest infant care and safety recommendations and ways to support your own child as he or she transitions into the parenting role. Taught by Registered Nurses who are childbirth instructors, but most importantly...they are grandmothers too! $10/person. Childbirth Class- Natural Childbirth: This series of 5 weekly classes is for expectant parents who want to learn and practice natural methods of coping with the process of labor and childbirth. Relaxation, breathing, massage, visualization, role of the partner, and helpful positioning are highlighted. Participants learn how to be confident in their body's ability to give birth. This class will empower and help parents make informed decisions about their own care. Includes discussion that will help new parents transition into the immediate postpartum period. [facility] is included. We  suggest taking this class between 25-32 weeks, but it's only a recommendation. $75 per registrant and one support person or $30 Medicaid. Childbirth Class- 3 week Series: This option of 3 weekly classes helps you and your labor partner prepare for childbirth. Newborn care, labor & birth, cesarean birth, pain management, and comfort techniques are discussed and a [place] [facility] is included. The class meets at the same time, on the same day of the week for 3 consecutive weeks beginning with the starting date you choose. $60 for registrant and one support person.  Marvelous Multiples: Expecting twins, triplets, or more? This class covers the differences in labor, birth, parenting, and breastfeeding issues that face multiples' parents. NICU tour is included. Led by a Certified Childbirth Educator who is the mother of twins. No fee. Caring for Baby: This class is for expectant and adoptive parents who want to learn and practice the most up-to-date newborn care for their babies. Focus is on birth through the first six weeks of life. Topics include feeding, bathing, diapering,  crying, umbilical cord care, circumcision care and safe sleep. Parents learn to recognize symptoms of illness and when to call the pediatrician. Register only the mom-to-be and your partner or support person can plan to come with you! $10 per registrant and support person Childbirth Class- online option: This online class offers you the freedom to complete a Birth and Baby series in the comfort of your own home. The flexibility of this option allows you to review sections at your own pace, at times convenient to you and your support people. It includes additional video information, animations, quizzes, and extended activities. Get organized with helpful eClass tools, checklists, and trackers. Once you register online for the class, you will receive an email within a few days to accept the invitation and begin the  class when the time is right for you. The content will be available to you for 60 days. $60 for 60 days of online access for you and your support people.  Local Doulas: Natural Baby Doulas [email or web address] Tel: [phone] [email or web address] [employer] [phone] [email or web address] The Labor Hassell Halim  (also do waterbirth tub rental) [phone] [email or web address] Triad Birth Doula [phone] [email or web address] NotebookDistributors.fi Sacred Rhythms  [phone] https://[email or web address]-[email or web address]/ [employer] (PADA) [email or web address]://[email or web address]/ [place] Birth and Baby  [email or web address]/ Considering Waterbirth? Guide for patients at [facility] [employer]  Why consider waterbirth?  . Gentle birth for babies . Less pain medicine used in labor . May allow for passive descent/less pushing . May reduce perineal tears  . More mobility and instinctive maternal position changes . Increased maternal relaxation . Reduced blood pressure in labor  Is waterbirth safe? What are the risks of infection, drowning or other complications?  . Infection: o Very low risk (3.7 % for tub vs 4.8% for bed) o 7 in 8000 waterbirths with documented infection o Poorly cleaned equipment most common cause o Slightly lower group B strep transmission rate  . Drowning o Maternal:  - Very low risk   - Related to seizures or fainting o Newborn:  - Very low risk. No evidence of increased risk of respiratory problems in multiple large studies - Physiological protection from breathing under water - Avoid underwater birth if there are any fetal complications - Once baby's head is out of the water, keep it out.  . Birth complication o Some reports of cord trauma, but risk decreased by bringing baby to surface  gradually o No evidence of increased risk of shoulder dystocia. Mothers can usually change positions faster in water than in a bed, possibly aiding the maneuvers to free the shoulder.   You must attend a Doren Custard class at [facility]  3rd Wednesday of every month from 7-9pm  [employer] by calling [phone] or online at [email or web address]  Bring [place] the certificate from the class to your prenatal appointment  Meet with a midwife at 36 weeks to see if you can still plan a waterbirth and to sign the consent.   Purchase or rent the following supplies:   Water Birth Pool (Birth Pool in a Box or [place] for instance)  (Tubs start ~$125)  Single-use disposable tub liner designed for your brand of tub  New garden hose labeled "lead-free", "suitable for drinking water",  Electric drain pump to remove water (We recommend 792 gallon per hour or greater pump.)   Separate garden hose to remove the dirty water  Fish net  Bathing suit top (optional)  Long-handled mirror (optional)  Places to purchase or rent supplies  GotWebTools.is for tub purchases and supplies  Waterbirthsolutions.com for tub purchases and supplies  The Labor Ladies (www.[email or web address]) $275 for tub rental/set-up & take down/kit   [employer] (http://www.fleming.com/.htm) Information regarding doulas (labor support) who provide pool rentals  Our practice has a Birth Pool in a Box tub at the hospital that you may borrow on a first-come-first-served basis. It is your responsibility to to set up, clean and break down the tub. We cannot guarantee the availability of this tub in advance. You are responsible for bringing all accessories listed above. If you do not have all necessary supplies you cannot have a waterbirth.    Things that would prevent you from having a waterbirth:  Premature, <37wks  Previous cesarean birth  Presence of thick meconium-stained  fluid  Multiple gestation (Twins, triplets, etc.)  Uncontrolled diabetes or gestational diabetes requiring medication  Hypertension requiring medication or diagnosis of pre-eclampsia  Heavy vaginal bleeding  Non-reassuring fetal heart rate  Active infection (MRSA, etc.). Group B Strep is NOT a contraindication for  waterbirth.  If your labor has to be induced and induction method requires continuous  monitoring of the baby's heart rate  Other risks/issues identified by your obstetrical provider  Please remember that birth is unpredictable. Under certain unforeseeable circumstances your provider may advise against giving birth in the tub. These decisions will be made on a case-by-case basis and with the safety of you and your baby as our highest priority.     AREA PEDIATRIC/FAMILY [place] [address] [place], [place]  [address] Phone - [phone]   Fax - [phone]  [facility] [place] [address] [place], [place] [address] Phone - [phone]   Fax - [place] [address] [place], [place]  [address] Phone - [phone]   Fax - [phone]  [place] [place] [address] [place], [place]  [address] Phone - [phone]   Fax - [phone]  [place] [address] [place], [place]  [address] Phone - [phone]   Fax - [phone]  [facility] [address] [place], [place]  [address] Phone - [phone]   Fax - [place] [address], [place], [place]  [address] Phone - [phone]   Fax - [phone]  [place] [address] [place], [place] [address] [place], [place]  [address] Phone - [phone]   Fax - [place] [address] [place], [place]  [address] Phone - [phone]    Fax - [phone] [facility] [place]. [address] [place], [place]  [address] Phone - [phone]   Fax - [phone]  [place] [address] [place], [place]  [address] Phone - [phone]   Fax - [phone]  [facility] [place], [place], [place]  [address] Phone - [phone]   Fax - [place] [address] [place], [place]  [address] Phone - [phone]   Fax - [phone]  [facility] [address], [place], [place]  [address] Phone - [place], [place]  [address] Phone - [phone]   Fax - [place] [address], [place], [place]  [address] Phone -  [phone]   Fax - [phone]  [place] [address] [place], [place]  [address] Phone - [phone]   Fax - [place], [place]  [address] Phone - [phone]   Fax - [place], [place], [place]  [address] Phone - [phone]   Fax - [place] [address], [place], [place]  [address] Phone - [phone]   Fax - [phone]  [place] [address], [place], [place]  [address] Phone - [phone]   Fax - [place] [address], [place], [place]  [address] Phone - [phone]   Fax - [phone]  [place] [address] [place], [place]  [address] Phone - [phone]   Fax - [phone] [place] [address] [place], [place]  [address] Phone - [phone]   Fax - [place] [address] [place], [place]  [address] Phone - [phone]   Fax -  [place] [address], [place], [place]  [address] Phone - [phone]   Fax - [phone]  [place] [address] [place] [address] Phone [phone]  Fax [phone]  Safe Medications in Pregnancy   Acne:  Benzoyl Peroxide  Salicylic Acid   Backache/Headache:  Tylenol: 2 regular strength every 4 hours OR        2 Extra strength every 6 hours   Colds/Coughs/Allergies:  Benadryl (alcohol free) 25 mg every 6 hours as needed  Breath right strips  Claritin  Cepacol throat lozenges  Chloraseptic throat spray  Cold-Eeze- up to three times per day  Cough drops, alcohol free  Flonase (by prescription only)  Guaifenesin  Mucinex  Robitussin DM (plain only, alcohol free)  Saline nasal spray/drops  Sudafed (pseudoephedrine) & Actifed * use only after [redacted] weeks gestation and if you do not have high blood pressure  Tylenol  Vicks Vaporub  Zinc lozenges  Zyrtec   Constipation:  Colace  Ducolax suppositories  Fleet enema  Glycerin suppositories  Metamucil  Milk of magnesia  Miralax  Senokot  Smooth move tea   Diarrhea:  Kaopectate  Imodium A-D   *NO pepto Bismol   Hemorrhoids:  Anusol  Anusol HC  Preparation H  Tucks   Indigestion:  Tums  Maalox  Mylanta  Zantac  Pepcid   Insomnia:  Benadryl (alcohol free) 5m every 6 hours as needed  Tylenol PM  Unisom, no Gelcaps   Leg Cramps:  Tums  MagGel   Nausea/Vomiting:  Bonine  Dramamine  Emetrol  Ginger extract  Sea bands  Meclizine  Nausea medication to take during pregnancy:  Unisom (doxylamine succinate 25 mg tablets) Take one tablet daily at bedtime. If symptoms are not adequately controlled, the dose can be increased to a maximum recommended dose of two tablets daily (1/2 tablet in the morning, 1/2 tablet mid-afternoon and one at bedtime).  Vitamin B6 1085mtablets.  Take one tablet twice a day (up to 200 mg per day).   Skin Rashes:  Aveeno products  Benadryl cream or 2543mvery 6 hours as needed  Calamine Lotion  1% cortisone cream   Yeast infection:  Gyne-lotrimin 7  Monistat 7    **If taking multiple medications, please check labels to avoid  duplicating the same active ingredients  **take medication as directed on the label  ** Do not exceed 4000 mg of tylenol in 24 hours  **Do not take medications that contain aspirin or ibuprofen

## 2018-03-04 LAB — CERVICOVAGINAL ANCILLARY ONLY
Chlamydia: NEGATIVE
Neisseria Gonorrhea: NEGATIVE

## 2018-03-05 LAB — AFP, SERUM, OPEN SPINA BIFIDA
AFP MOM: 1.52
AFP VALUE AFPOSL: 48.4 ng/mL
GEST. AGE ON COLLECTION DATE: 16 wk
Maternal Age At EDD: 31.1 yr
OSBR RISK 1 IN: 5182
Test Results:: NEGATIVE
Weight: 178 [lb_av]

## 2018-03-05 LAB — OBSTETRIC PANEL, INCLUDING HIV
Antibody Screen: NEGATIVE
BASOS ABS: 0 10*3/uL (ref 0.0–0.2)
Basos: 0 %
EOS (ABSOLUTE): 0.1 10*3/uL (ref 0.0–0.4)
Eos: 1 %
HEP B S AG: NEGATIVE
HIV Screen 4th Generation wRfx: NONREACTIVE
Hematocrit: 35.5 % (ref 34.0–46.6)
Hemoglobin: 11.6 g/dL (ref 11.1–15.9)
IMMATURE GRANS (ABS): 0 10*3/uL (ref 0.0–0.1)
IMMATURE GRANULOCYTES: 0 %
LYMPHS: 29 %
Lymphocytes Absolute: 2.4 10*3/uL (ref 0.7–3.1)
MCH: 26.8 pg (ref 26.6–33.0)
MCHC: 32.7 g/dL (ref 31.5–35.7)
MCV: 82 fL (ref 79–97)
MONOCYTES: 7 %
Monocytes Absolute: 0.5 10*3/uL (ref 0.1–0.9)
Neutrophils Absolute: 5.2 10*3/uL (ref 1.4–7.0)
Neutrophils: 63 %
PLATELETS: 337 10*3/uL (ref 150–450)
RBC: 4.33 x10E6/uL (ref 3.77–5.28)
RDW: 18.1 % — AB (ref 12.3–15.4)
RPR: NONREACTIVE
RUBELLA: 1.9 {index} (ref >0.99)
Rh Factor: POSITIVE
WBC: 8.2 10*3/uL (ref 3.4–10.8)

## 2018-03-05 LAB — CULTURE, OB URINE

## 2018-03-05 LAB — URINE CULTURE, OB REFLEX

## 2018-03-09 ENCOUNTER — Inpatient Hospital Stay (HOSPITAL_COMMUNITY)
Admission: AD | Admit: 2018-03-09 | Discharge: 2018-03-09 | Disposition: A | Discharge status: Home / Self Care | Payer: Medicaid Other | Source: Ambulatory Visit | Attending: Obstetrics and Gynecology | Admitting: Obstetrics and Gynecology

## 2018-03-09 ENCOUNTER — Encounter (HOSPITAL_COMMUNITY): Payer: Self-pay | Admitting: *Deleted

## 2018-03-09 DIAGNOSIS — O23592 Infection of other part of genital tract in pregnancy, second trimester: Secondary | ICD-10-CM

## 2018-03-09 DIAGNOSIS — Z3A17 17 weeks gestation of pregnancy: Secondary | ICD-10-CM | POA: Insufficient documentation

## 2018-03-09 DIAGNOSIS — A5901 Trichomonal vulvovaginitis: Secondary | ICD-10-CM | POA: Diagnosis not present

## 2018-03-09 DIAGNOSIS — F17210 Nicotine dependence, cigarettes, uncomplicated: Secondary | ICD-10-CM | POA: Insufficient documentation

## 2018-03-09 DIAGNOSIS — O99332 Smoking (tobacco) complicating pregnancy, second trimester: Secondary | ICD-10-CM | POA: Diagnosis not present

## 2018-03-09 DIAGNOSIS — B373 Candidiasis of vulva and vagina: Secondary | ICD-10-CM | POA: Insufficient documentation

## 2018-03-09 DIAGNOSIS — O98812 Other maternal infectious and parasitic diseases complicating pregnancy, second trimester: Secondary | ICD-10-CM | POA: Diagnosis not present

## 2018-03-09 DIAGNOSIS — L293 Anogenital pruritus, unspecified: Secondary | ICD-10-CM | POA: Diagnosis present

## 2018-03-09 DIAGNOSIS — O98312 Other infections with a predominantly sexual mode of transmission complicating pregnancy, second trimester: Secondary | ICD-10-CM | POA: Insufficient documentation

## 2018-03-09 DIAGNOSIS — B3731 Acute candidiasis of vulva and vagina: Secondary | ICD-10-CM

## 2018-03-09 LAB — URINALYSIS, ROUTINE W REFLEX MICROSCOPIC
Bilirubin Urine: NEGATIVE
GLUCOSE, UA: NEGATIVE mg/dL
Hgb urine dipstick: NEGATIVE
Ketones, ur: 20 mg/dL — AB
Nitrite: NEGATIVE
Protein, ur: 100 mg/dL — AB
SPECIFIC GRAVITY, URINE: 1.033 — AB (ref 1.005–1.030)
pH: 5 (ref 5.0–8.0)

## 2018-03-09 LAB — WET PREP, GENITAL: Sperm: NONE SEEN

## 2018-03-09 MED ORDER — TRIAMCINOLONE ACETONIDE 0.1 % EX CREA
TOPICAL_CREAM | Freq: Once | CUTANEOUS | Status: AC
  Administered 2018-03-09: 02:00:00 via TOPICAL
  Filled 2018-03-09: qty 15

## 2018-03-09 MED ORDER — METRONIDAZOLE 500 MG PO TABS
2000.0000 mg | ORAL_TABLET | Freq: Once | ORAL | Status: AC
  Administered 2018-03-09: 2000 mg via ORAL
  Filled 2018-03-09: qty 4

## 2018-03-09 MED ORDER — MICONAZOLE NITRATE 2 % VA CREA: 1.0000 | TOPICAL_CREAM | Freq: Every day | VAGINAL | 1 refills | Status: DC

## 2018-03-09 MED ORDER — TRIAMCINOLONE ACETONIDE 0.1 % EX CREA: 1.0000 "application " | TOPICAL_CREAM | Freq: Two times a day (BID) | CUTANEOUS | 0 refills | Status: DC

## 2018-03-09 NOTE — MAU Note (Signed)
Pt here with vaginal itching for the last couple days; recently changed soap. "Been scratching all day, so I probably made it worse, but I can't sleep with it like this."

## 2018-03-09 NOTE — MAU Note (Signed)
Pt had been using dove soap on her genital area but she switched to irish spring one week ago and she has been itching and very irritated on her labia for the past 3 days.  Denies any vag bleeding or discharge.

## 2018-03-09 NOTE — MAU Provider Note (Signed)
Chief Complaint: Vaginal Itching   First Provider Initiated Contact with Patient 03/09/18 0323      SUBJECTIVE HPI: Cindy Cruz is a 32 y.o. [id number] at [redacted]w[redacted]d who presents to Maternity Admissions reporting vulvar Irritation x 3 days that she attributes to changing soaps.  Denies any vag bleeding or discharge.  Modifying factors: None. Hasn't tried anything.  Associated signs and symptoms: Neg for abd pain, fever, chills, vulvar lesions, vag bleeding or discharge.   Past Medical History:  Diagnosis Date  . 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 Depression    ok now  . Trichomoniasis    OB History  Gravida Para Term Preterm AB Living  7 5 5  0 1 5  SAB TAB Ectopic Multiple Live Births  0 0 0 0 5    # Outcome Date GA Lbr Len/2nd Weight Sex Delivery Anes PTL Lv  7 Current           6 Term 08/20/16 [redacted]w[redacted]d 02:57 / 00:10 7 lb 14.1 oz (3.575 kg) M Vag-Spont EPI  LIV  5 AB 05/25/14 [redacted]w[redacted]d         4 Term 01/04/09   6 lb (2.722 kg) M Vag-Spont None N LIV  3 Term 08/21/07   7 lb (3.175 kg) M Vag-Spont EPI N LIV  2 Term 03/25/05   6 lb 3.2 oz (2.812 kg) F Vag-Spont EPI N LIV  1 Term 12/01/03   7 lb 3.2 oz (3.266 kg) F Vag-Spont EPI N LIV   Past Surgical History:  Procedure Laterality Date  . WISDOM TOOTH EXTRACTION     Social History   Socioeconomic History  . Marital status: Single    Spouse name: Not on file  . Number of children: Not on file  . Years of education: Not on file  . Highest education level: Not on file  Occupational History  . Not on file  Social Needs  . Financial resource strain: Not on file  . Food insecurity:    Worry: Not on file    Inability: Not on file  . Transportation needs:    Medical: Not on file    Non-medical: Not on file  Tobacco Use  . Smoking status: Current Every Day Smoker    Packs/day: 0.25    Years: 10.00    Pack years: 2.50    Types: Cigarettes  . Smokeless tobacco: Never Used  Substance and Sexual Activity   . Alcohol use: Not Currently    Comment: socially on occ.-liquor  . Drug use: No  . Sexual activity: Yes    Birth control/protection: None  Lifestyle  . Physical activity:    Days per week: Not on file    Minutes per session: Not on file  . Stress: Not on file  Relationships  . Social connections:    Talks on phone: Not on file    Gets together: Not on file    Attends religious service: Not on file    Active member of club or organization: Not on file    Attends meetings of clubs or organizations: Not on file    Relationship status: Not on file  . Intimate partner violence:    Fear of current or ex partner: Not on file    Emotionally abused: Not on file    Physically abused: Not on file    Forced sexual activity: Not on file  Other Topics Concern  . Not on file  Social  History Narrative  . Not on file   Family History  Problem Relation Age of Onset  . Hypertension Mother   . Cancer Maternal Aunt        breast  . Hearing loss Neg Hx    No current facility-administered medications on file prior to encounter.    Current Outpatient Medications on File Prior to Encounter  Medication Sig Dispense Refill  . famotidine (PEPCID) 20 MG tablet Take 1 tablet (20 mg total) by mouth 2 (two) times daily. 60 tablet 3  . fluticasone (FLONASE) 50 MCG/ACT nasal spray Place 2 sprays into both nostrils daily. (Patient not taking: Reported on 03/03/2018) 1 g 0  . ipratropium (ATROVENT) 0.06 % nasal spray Place 2 sprays into both nostrils 4 (four) times daily. (Patient not taking: Reported on 03/03/2018) 15 mL 0  . metroNIDAZOLE (FLAGYL) 500 MG tablet Take 1 tablet (500 mg total) by mouth 2 (two) times daily. No alcohol while taking this medication (Patient not taking: Reported on 03/03/2018) 14 tablet 0  . Prenatal Vit-Fe Fumarate-FA (PRENATAL MULTIVITAMIN) TABS tablet Take 1 tablet by mouth daily at 12 noon.    . prenatal vitamin w/FE, FA (PRENATAL 1 + 1) 27-1 MG TABS tablet Take 1 tablet by  mouth daily at 12 noon. 30 each 0  . promethazine (PHENERGAN) 25 MG tablet Take 1 tablet (25 mg total) by mouth every 6 (six) hours as needed for nausea or vomiting. Take 1/2 tablet if needed. (Patient not taking: Reported on 03/03/2018) 30 tablet 0   No Known Allergies  I have reviewed patient's Past Medical Hx, Surgical Hx, Family Hx, Social Hx, medications and allergies.   Review of Systems  Constitutional: Negative for chills and fever.  Gastrointestinal: Negative for abdominal pain.  Genitourinary: Negative for dysuria, genital sores, pelvic pain, vaginal bleeding, vaginal discharge and vaginal pain.       Vulvar irritation.     OBJECTIVE Patient Vitals for the past 24 hrs:  BP Temp Temp src Pulse Resp SpO2 Height Weight  03/09/18 0324 122/78 - - 99 16 - - -  03/09/18 0105 - - - - - - 5\' 5"  (1.651 m) -  03/09/18 0103 124/71 98.5 F (36.9 C) Oral 99 18 100 % - 178 lb (80.7 kg)   Constitutional: Well-developed, well-nourished female in no acute distress.  Cardiovascular: normal rate Respiratory: normal rate and effort.  GI: Abd soft, non-tender. MS: Extremities nontender, no edema, normal ROM Neurologic: Alert and oriented x 4.  GU:  PELVIC EXAM: NEFG, small amount if thin, white, malodorous discharge mixed w/ curdlike discharge, no blood noted. No lesions.  BIMANUAL: cervix Closed; uterus normal size, no adnexal tenderness or masses. No CMT.  LAB RESULTS Results for orders placed or performed during the hospital encounter of 03/09/18 (from the past 24 hour(s))  Urinalysis, Routine w reflex microscopic     Status: Abnormal   Collection Time: 03/09/18  1:07 AM  Result Value Ref Range   Color, Urine AMBER (A) YELLOW   APPearance CLOUDY (A) CLEAR   Specific Gravity, Urine 1.033 (H) 1.005 - 1.030   pH 5.0 5.0 - 8.0   Glucose, UA NEGATIVE NEGATIVE mg/dL   Hgb urine dipstick NEGATIVE NEGATIVE   Bilirubin Urine NEGATIVE NEGATIVE   Ketones, ur 20 (A) NEGATIVE mg/dL   Protein, ur  213100 (A) NEGATIVE mg/dL   Nitrite NEGATIVE NEGATIVE   Leukocytes, UA LARGE (A) NEGATIVE   RBC / HPF 21-50 0 - 5 RBC/hpf  WBC, UA 21-50 0 - 5 WBC/hpf   Bacteria, UA MANY (A) NONE SEEN   Squamous Epithelial / LPF 21-50 0 - 5   Mucus PRESENT   Wet prep, genital     Status: Abnormal   Collection Time: 03/09/18  2:19 AM  Result Value Ref Range   Yeast Wet Prep HPF POC PRESENT (A) NONE SEEN   Trich, Wet Prep PRESENT (A) NONE SEEN   Clue Cells Wet Prep HPF POC PRESENT (A) NONE SEEN   WBC, Wet Prep HPF POC MODERATE (A) NONE SEEN   Sperm NONE SEEN     IMAGING No results found.  MAU COURSE Orders Placed This Encounter  Procedures  . Wet prep, genital  . Culture, OB Urine  . Urinalysis, Routine w reflex microscopic  . Lab instructions  . Discharge patient   Meds ordered this encounter  Medications  . triamcinolone cream (KENALOG) 0.1 %  . metroNIDAZOLE (FLAGYL) tablet 2,000 mg  . miconazole (MONISTAT 7) 2 % vaginal cream    Sig: Place 1 Applicatorful vaginally at bedtime.    Dispense:  45 g    Refill:  1    Order Specific Question:   Supervising Provider    Answer:   Tilda Burrow [[id number]]  . triamcinolone cream (KENALOG) 0.1 %    Sig: Apply 1 application topically 2 (two) times daily. Apply to your vulva 2 times per day. Use for as short of a times as is needed for symptoms to resolve.    Dispense:  30 g    Refill:  0    Order Specific Question:   Supervising Provider    Answer:   Galen Daft    MDM - Vulvar irritation from VVC and trich. Had severe irritation from Terazol in the past. Will Tx w/ Monistat, Triamcinolone and Flagyl. Partner needs Tx, but is incarcerated.   ASSESSMENT 1. Trichomonal vaginitis during pregnancy in second trimester   2. Vaginal yeast infection     PLAN Discharge home in stable condition. Safe Sex discussed No IC x 1 week  Recommend condoms  Follow-up Information    [facility] Follow up on 03/31/2018.   Why:  as  scheduled or sooner as needed if symptoms do not improve in 3 days.  Contact information: [address] [place] [address] [phone]       [facility] [place] [facility]ERN[facility] ADMISS[facility] Follow up.   Why:  As needed in pregnancy emergencies Contact information: [address] [id number] mc [place] [address] [phone]         Allergies as of 03/09/2018   No Known Allergies     Medication List    STOP taking these medications   fluticasone 50 MCG/ACT nasal spray Commonly known as:  FLONASE   ipratropium 0.06 % nasal spray Commonly known as:  ATROVENT   metroNIDAZOLE 500 MG tablet Commonly known as:  FLAGYL   prenatal multivitamin Tabs tablet   promethazine 25 MG tablet Commonly known as:  PHENERGAN     TAKE these medications   famotidine 20 MG tablet Commonly known as:  PEPCID Take 1 tablet (20 mg total) by mouth 2 (two) times daily.   miconazole 2 % vaginal cream Commonly known as:  MONISTAT 7 Place 1 Applicatorful vaginally at bedtime.   prenatal vitamin w/FE, FA 27-1 MG Tabs tablet Take 1 tablet by mouth daily at 12 noon.   triamcinolone cream 0.1 % Commonly  known as:  KENALOG Apply 1 application topically 2 (two) times daily. Apply to your vulva 2 times per day. Use for as short of a times as is needed for symptoms to resolve.        Katrinka Blazing, [place], CNM 03/09/2018  3:40 AM

## 2018-03-09 NOTE — Discharge Instructions (Signed)
Trichomoniasis °Trichomoniasis is an STI (sexually transmitted infection) that can affect both women and men. In women, the outer area of the female genitalia (vulva) and the vagina are affected. In men, the penis is mainly affected, but the prostate and other reproductive organs can also be involved. This condition can be treated with medicine. It often has no symptoms (is asymptomatic), especially in men. °What are the causes? °This condition is caused by an organism called Trichomonas vaginalis. Trichomoniasis most often spreads from person to person (is contagious) through sexual contact. °What increases the risk? °The following factors may make you more likely to develop this condition: °· Having unprotected sexual intercourse. °· Having sexual intercourse with a partner who has trichomoniasis. °· Having multiple sexual partners. °· Having had previous trichomoniasis infections or other STIs. ° °What are the signs or symptoms? °In women, symptoms of trichomoniasis include: °· Abnormal vaginal discharge that is clear, white, gray, or yellow-green and foamy and has an unusual "fishy" odor. °· Itching and irritation of the vagina and vulva. °· Burning or pain during urination or sexual intercourse. °· Genital redness and swelling. ° °In men, symptoms of trichomoniasis include: °· Penile discharge that may be foamy or contain pus. °· Pain in the penis. This may happen only when urinating. °· Itching or irritation inside the penis. °· Burning after urination or ejaculation. ° °How is this diagnosed? °In women, this condition may be found during a routine Pap test or physical exam. It may be found in men during a routine physical exam. Your health care provider may perform tests to help diagnose this infection, such as: °· Urine tests (men and women). °· The following in women: °? Testing the pH of the vagina. °? A vaginal swab test that checks for the Trichomonas vaginalis organism. °? Testing vaginal  secretions. ° °Your health care provider may test you for other STIs, including HIV (human immunodeficiency virus). °How is this treated? °This condition is treated with medicine taken by mouth (orally), such as metronidazole or tinidazole to fight the infection. Your sexual partner(s) may also need to be tested and treated. °· If you are a woman and you plan to become pregnant or think you may be pregnant, tell your health care provider right away. Some medicines that are used to treat the infection should not be taken during pregnancy. ° °Your health care provider may recommend over-the-counter medicines or creams to help relieve itching or irritation. You may be tested for infection again 3 months after treatment. °Follow these instructions at home: °· Take and use over-the-counter and prescription medicines, including creams, only as told by your health care provider. °· Do not have sexual intercourse until one week after you finish your medicine, or until your health care provider approves. Ask your health care provider when you may resume sexual intercourse. °· (Women) Do not douche or wear tampons while you have the infection. °· Discuss your infection with your sexual partner(s). Make sure that your partner gets tested and treated, if necessary. °· Keep all follow-up visits as told by your health care provider. This is important. °How is this prevented? °· Use condoms every time you have sex. Using condoms correctly and consistently can help protect against STIs. °· Avoid having multiple sexual partners. °· Talk with your sexual partner about any symptoms that either of you may have, as well as any history of STIs. °· Get tested for STIs and STDs (sexually transmitted diseases) before you have sex. Ask your partner   to do the same.  Do not have sexual contact if you have symptoms of trichomoniasis or another STI. Contact a health care provider if:  You still have symptoms after you finish your  medicine.  You develop pain in your abdomen.  You have pain when you urinate.  You have bleeding after sexual intercourse.  You develop a rash.  You feel nauseous or you vomit.  You plan to become pregnant or think you may be pregnant. Summary  Trichomoniasis is an STI (sexually transmitted infection) that can affect both women and men.  This condition often has no symptoms (is asymptomatic), especially in men.  You should not have sexual intercourse until one week after you finish your medicine, or until your health care provider approves. Ask your health care provider when you may resume sexual intercourse.  Discuss your infection with your sexual partner. Make sure that your partner gets tested and treated, if necessary. This information is not intended to replace advice given to you by your health care provider. Make sure you discuss any questions you have with your health care provider. Document Released: 01/29/2001 Document Revised: 06/28/2016 Document Reviewed: 06/28/2016 Elsevier Interactive Patient Education  2017 Elsevier Inc.   Vaginal Yeast infection, Adult Vaginal yeast infection is a condition that causes soreness, swelling, and redness (inflammation) of the vagina. It also causes vaginal discharge. This is a common condition. Some women get this infection frequently. What are the causes? This condition is caused by a change in the normal balance of the yeast (candida) and bacteria that live in the vagina. This change causes an overgrowth of yeast, which causes the inflammation. What increases the risk? This condition is more likely to develop in:  Women who take antibiotic medicines.  Women who have diabetes.  Women who take birth control pills.  Women who are pregnant.  Women who douche often.  Women who have a weak defense (immune) system.  Women who have been taking steroid medicines for a long time.  Women who frequently wear tight clothing.  What  are the signs or symptoms? Symptoms of this condition include:  White, thick vaginal discharge.  Swelling, itching, redness, and irritation of the vagina. The lips of the vagina (vulva) may be affected as well.  Pain or a burning feeling while urinating.  Pain during sex.  How is this diagnosed? This condition is diagnosed with a medical history and physical exam. This will include a pelvic exam. Your health care provider will examine a sample of your vaginal discharge under a microscope. Your health care provider may send this sample for testing to confirm the diagnosis. How is this treated? This condition is treated with medicine. Medicines may be over-the-counter or prescription. You may be told to use one or more of the following:  Medicine that is taken orally.  Medicine that is applied as a cream.  Medicine that is inserted directly into the vagina (suppository).  Follow these instructions at home:  Take or apply over-the-counter and prescription medicines only as told by your health care provider.  Do not have sex until your health care provider has approved. Tell your sex partner that you have a yeast infection. That person should go to his or her health care provider if he or she develops symptoms.  Do not wear tight clothes, such as pantyhose or tight pants.  Avoid using tampons until your health care provider approves.  Eat more yogurt. This may help to keep your yeast infection from returning.  Try taking a sitz bath to help with discomfort. This is a warm water bath that is taken while you are sitting down. The water should only come up to your hips and should cover your buttocks. Do this 3-4 times per day or as told by your health care provider.  Do not douche.  Wear breathable, cotton underwear.  If you have diabetes, keep your blood sugar levels under control. Contact a health care provider if:  You have a fever.  Your symptoms go away and then  return.  Your symptoms do not get better with treatment.  Your symptoms get worse.  You have new symptoms.  You develop blisters in or around your vagina.  You have blood coming from your vagina and it is not your menstrual period.  You develop pain in your abdomen. This information is not intended to replace advice given to you by your health care provider. Make sure you discuss any questions you have with your health care provider. Document Released: 05/15/2005 Document Revised: 01/17/2016 Document Reviewed: 02/06/2015 Elsevier Interactive Patient Education  2018 [employer]

## 2018-03-10 LAB — CYSTIC FIBROSIS MUTATION 97: Interpretation: NOT DETECTED

## 2018-03-10 LAB — CULTURE, OB URINE: SPECIAL REQUESTS: NORMAL

## 2018-03-11 LAB — SMN1 COPY NUMBER ANALYSIS (SMA CARRIER SCREENING)

## 2018-03-12 DIAGNOSIS — O23592 Infection of other part of genital tract in pregnancy, second trimester: Secondary | ICD-10-CM

## 2018-03-12 DIAGNOSIS — A5901 Trichomonal vulvovaginitis: Secondary | ICD-10-CM

## 2018-03-17 ENCOUNTER — Encounter (HOSPITAL_COMMUNITY): Payer: Self-pay

## 2018-03-24 ENCOUNTER — Ambulatory Visit (HOSPITAL_COMMUNITY): Admission: RE | Admit: 2018-03-24 | Payer: Medicaid Other | Source: Ambulatory Visit

## 2018-03-31 ENCOUNTER — Ambulatory Visit (INDEPENDENT_AMBULATORY_CARE_PROVIDER_SITE_OTHER): Payer: Medicaid Other | Admitting: Family Medicine

## 2018-03-31 VITALS — BP 117/83 | HR 134 | Wt 175.5 lb

## 2018-03-31 DIAGNOSIS — Z3480 Encounter for supervision of other normal pregnancy, unspecified trimester: Secondary | ICD-10-CM

## 2018-03-31 DIAGNOSIS — Z3482 Encounter for supervision of other normal pregnancy, second trimester: Secondary | ICD-10-CM

## 2018-03-31 DIAGNOSIS — R059 Cough, unspecified: Secondary | ICD-10-CM

## 2018-03-31 DIAGNOSIS — Z641 Problems related to multiparity: Secondary | ICD-10-CM

## 2018-03-31 DIAGNOSIS — R05 Cough: Secondary | ICD-10-CM

## 2018-03-31 MED ORDER — OMEPRAZOLE MAGNESIUM 20 MG PO TBEC: 20.0000 mg | DELAYED_RELEASE_TABLET | Freq: Every day | ORAL | 3 refills | Status: DC

## 2018-03-31 MED ORDER — BENZONATATE 100 MG PO CAPS: 100.0000 mg | ORAL_CAPSULE | Freq: Three times a day (TID) | ORAL | 0 refills | Status: DC | PRN

## 2018-03-31 NOTE — Progress Notes (Signed)
    PRENATAL VISIT NOTE  Subjective:  Cindy Cruz is a 32 y.o. [id number] at 3984w6d being seen today for ongoing prenatal care.  She is currently monitored for the following issues for this low-risk pregnancy and has Supervision of other normal pregnancy, antepartum; Trichomonal vaginitis during pregnancy in second trimester; and Grand multipara on their problem list.  Patient reports cough x 1 wk, tried Robitussin without relief. Has GERD, on Pepcid.  Contractions: Not present. Vag. Bleeding: None.  Movement: Present. Denies leaking of fluid.   The following portions of the patient's history were reviewed and updated as appropriate: allergies, current medications, past family history, past medical history, past social history, past surgical history and problem list. Problem list updated.  Objective:   Vitals:   03/31/18 1308  BP: 117/83  Pulse: (!) 134  Weight: 175 lb 8 oz (79.6 kg)    Fetal Status: Fetal Heart Rate (bpm): 148 Fundal Height: 20 cm Movement: Present     General:  Alert, oriented and cooperative. Patient is in no acute distress.  Skin: Skin is warm and dry. No rash noted.   Cardiovascular: Normal heart rate noted  Respiratory: Normal respiratory effort, no problems with respiration noted  Abdomen: Soft, gravid, appropriate for gestational age.  Pain/Pressure: Absent     Pelvic: Cervical exam deferred        Extremities: Normal range of motion.  Edema: None  Mental Status: Normal mood and affect. Normal behavior. Normal judgment and thought content.   Assessment and Plan:  Pregnancy: [id number] at 7784w6d  1. Supervision of other normal pregnancy, antepartum Continue routine prenatal care. Anatomy u/s rescheduled  2. Grand multipara At risk of PPH  3. Cough-no URI symptoms, cough is dry Tessalon perles Treat possible underlying cause, GERD - benzonatate (TESSALON PERLES) 100 MG capsule; Take 1 capsule (100 mg total) by mouth 3 (three) times daily as needed for  cough.  Dispense: 20 capsule; Refill: 0 - omeprazole (PRILOSEC OTC) 20 MG tablet; Take 1 tablet (20 mg total) by mouth daily.  Dispense: 30 tablet; Refill: 3  General obstetric precautions including but not limited to vaginal bleeding, contractions, leaking of fluid and fetal movement were reviewed in detail with the patient. Please refer to After Visit Summary for other counseling recommendations.  Return in 4 weeks (on 04/28/2018).  Future Appointments  Date Time Provider Department Center  04/02/2018  2:00 PM [facility] US 3 [facility]    Reva Boresanya S Pratt, MD

## 2018-03-31 NOTE — Progress Notes (Signed)
Pt is G7P5 6741w6d here for ROB. Pt states she missed her anatomy scan US, rescheduled on 04/02/18 for 2 pm.

## 2018-03-31 NOTE — Patient Instructions (Signed)

## 2018-04-02 ENCOUNTER — Ambulatory Visit (HOSPITAL_COMMUNITY): Payer: Medicaid Other | Attending: Medical

## 2018-04-13 ENCOUNTER — Ambulatory Visit (HOSPITAL_COMMUNITY)
Admission: RE | Admit: 2018-04-13 | Discharge: 2018-04-13 | Disposition: A | Discharge status: Home / Self Care | Payer: Medicaid Other | Source: Ambulatory Visit | Attending: Medical | Admitting: Medical

## 2018-04-13 DIAGNOSIS — Z3A22 22 weeks gestation of pregnancy: Secondary | ICD-10-CM

## 2018-04-13 DIAGNOSIS — Z363 Encounter for antenatal screening for malformations: Secondary | ICD-10-CM

## 2018-04-13 DIAGNOSIS — J45909 Unspecified asthma, uncomplicated: Secondary | ICD-10-CM | POA: Insufficient documentation

## 2018-04-13 DIAGNOSIS — O99512 Diseases of the respiratory system complicating pregnancy, second trimester: Secondary | ICD-10-CM | POA: Insufficient documentation

## 2018-04-13 DIAGNOSIS — O99332 Smoking (tobacco) complicating pregnancy, second trimester: Secondary | ICD-10-CM | POA: Diagnosis not present

## 2018-04-13 DIAGNOSIS — Z3480 Encounter for supervision of other normal pregnancy, unspecified trimester: Secondary | ICD-10-CM

## 2018-04-13 DIAGNOSIS — O09892 Supervision of other high risk pregnancies, second trimester: Secondary | ICD-10-CM

## 2018-04-28 ENCOUNTER — Encounter: Payer: Medicaid Other | Admitting: Family Medicine

## 2018-05-12 ENCOUNTER — Ambulatory Visit (INDEPENDENT_AMBULATORY_CARE_PROVIDER_SITE_OTHER): Payer: Medicaid Other | Admitting: Obstetrics & Gynecology

## 2018-05-12 VITALS — BP 101/70 | HR 105 | Wt 178.0 lb

## 2018-05-12 DIAGNOSIS — Z3482 Encounter for supervision of other normal pregnancy, second trimester: Secondary | ICD-10-CM

## 2018-05-12 DIAGNOSIS — Z3480 Encounter for supervision of other normal pregnancy, unspecified trimester: Secondary | ICD-10-CM

## 2018-05-12 NOTE — Patient Instructions (Signed)

## 2018-05-12 NOTE — Progress Notes (Signed)
   PRENATAL VISIT NOTE  Subjective:  Cindy Cruz is a 32 y.o. [id number] at 5940w1d being seen today for ongoing prenatal care.  She is currently monitored for the following issues for this low-risk pregnancy and has Supervision of other normal pregnancy, antepartum; Trichomonal vaginitis during pregnancy in second trimester; and Grand multipara on their problem list.  Patient reports no complaints.  Contractions: Not present. Vag. Bleeding: None.  Movement: Present. Denies leaking of fluid.   The following portions of the patient's history were reviewed and updated as appropriate: allergies, current medications, past family history, past medical history, past social history, past surgical history and problem list. Problem list updated.  Objective:   Vitals:   05/12/18 1307  BP: 101/70  Pulse: (!) 105  Weight: 178 lb (80.7 kg)    Fetal Status: Fetal Heart Rate (bpm): 145 Fundal Height: 27 cm Movement: Present     General:  Alert, oriented and cooperative. Patient is in no acute distress.  Skin: Skin is warm and dry. No rash noted.   Cardiovascular: Normal heart rate noted  Respiratory: Normal respiratory effort, no problems with respiration noted  Abdomen: Soft, gravid, appropriate for gestational age.  Pain/Pressure: Absent     Pelvic: Cervical exam deferred        Extremities: Normal range of motion.     Mental Status: Normal mood and affect. Normal behavior. Normal judgment and thought content.   Assessment and Plan:  Pregnancy: [id number] at 3440w1d  1. Supervision of other normal pregnancy, antepartum ECD recalculated due to early US and unsure LMP  Preterm labor symptoms and general obstetric precautions including but not limited to vaginal bleeding, contractions, leaking of fluid and fetal movement were reviewed in detail with the patient. Please refer to After Visit Summary for other counseling recommendations.  Return in about 2 weeks (around 05/26/2018) for 2 hr GTT.  No  future appointments.  Scheryl DarterJames Arnold, MD

## 2018-05-27 ENCOUNTER — Other Ambulatory Visit: Payer: Medicaid Other

## 2018-05-27 ENCOUNTER — Encounter: Payer: Self-pay | Admitting: Obstetrics and Gynecology

## 2018-05-27 ENCOUNTER — Ambulatory Visit (INDEPENDENT_AMBULATORY_CARE_PROVIDER_SITE_OTHER): Payer: Medicaid Other | Admitting: Obstetrics and Gynecology

## 2018-05-27 VITALS — BP 101/68 | HR 86 | Wt 177.2 lb

## 2018-05-27 DIAGNOSIS — Z23 Encounter for immunization: Secondary | ICD-10-CM

## 2018-05-27 DIAGNOSIS — Z3480 Encounter for supervision of other normal pregnancy, unspecified trimester: Secondary | ICD-10-CM

## 2018-05-27 DIAGNOSIS — A5901 Trichomonal vulvovaginitis: Secondary | ICD-10-CM

## 2018-05-27 DIAGNOSIS — O23592 Infection of other part of genital tract in pregnancy, second trimester: Principal | ICD-10-CM

## 2018-05-27 DIAGNOSIS — Z3483 Encounter for supervision of other normal pregnancy, third trimester: Secondary | ICD-10-CM

## 2018-05-27 DIAGNOSIS — O23593 Infection of other part of genital tract in pregnancy, third trimester: Secondary | ICD-10-CM

## 2018-05-27 MED ORDER — PRENATE PIXIE 10-0.6-0.4-200 MG PO CAPS: 1.0000 | ORAL_CAPSULE | Freq: Every day | ORAL | 11 refills | Status: DC

## 2018-05-27 NOTE — Progress Notes (Signed)
Complains of nausea and vomiting w/ current PNV's   Tdap and flu given today.

## 2018-05-27 NOTE — Progress Notes (Signed)
Subjective:  Cindy Cruz is a 32 y.o. [id number] at [redacted]w[redacted]d being seen today for ongoing prenatal care.  She is currently monitored for the following issues for this low-risk pregnancy and has Supervision of other normal pregnancy, antepartum; Trichomonal vaginitis during pregnancy in second trimester; and Grand multipara on their problem list.  Patient reports no complaints.  Contractions: Not present. Vag. Bleeding: None.  Movement: Present. Denies leaking of fluid.   The following portions of the patient's history were reviewed and updated as appropriate: allergies, current medications, past family history, past medical history, past social history, past surgical history and problem list. Problem list updated.  Objective:   Vitals:   05/27/18 0858  BP: 101/68  Pulse: 86  Weight: 177 lb 3.2 oz (80.4 kg)    Fetal Status: Fetal Heart Rate (bpm): 158   Movement: Present     General:  Alert, oriented and cooperative. Patient is in no acute distress.  Skin: Skin is warm and dry. No rash noted.   Cardiovascular: Normal heart rate noted  Respiratory: Normal respiratory effort, no problems with respiration noted  Abdomen: Soft, gravid, appropriate for gestational age. Pain/Pressure: Absent     Pelvic:  Cervical exam deferred        Extremities: Normal range of motion.  Edema: None  Mental Status: Normal mood and affect. Normal behavior. Normal judgment and thought content.   Urinalysis:      Assessment and Plan:  Pregnancy: [id number] at [redacted]w[redacted]d  1. Supervision of other normal pregnancy, antepartum Stable Change PNV per pt requests Considering Depo Provera or IUD Vaccines today - Glucose Tolerance, 2 Hours w/1 Hour - CBC - HIV Antibody (routine testing w rflx) - RPR - Tdap vaccine greater than or equal to 7yo IM - Flu Vaccine QUAD 36+ mos IM (Fluarix, Quad PF) - Prenat-FeAsp-Meth-FA-DHA w/o A (PRENATE PIXIE) 10-0.6-0.4-200 MG CAPS; Take 1 tablet by mouth daily.  Dispense: 30 capsule;  Refill: 11  2. Trichomonal vaginitis during pregnancy in second trimester  - Urine cytology ancillary only  Preterm labor symptoms and general obstetric precautions including but not limited to vaginal bleeding, contractions, leaking of fluid and fetal movement were reviewed in detail with the patient. Please refer to After Visit Summary for other counseling recommendations.  Return in about 2 weeks (around 06/10/2018) for OB visit.   Hermina Staggers, MD

## 2018-05-27 NOTE — Patient Instructions (Signed)
Third Trimester of Pregnancy The third trimester is from week 28 through week 40 (months 7 through 9). The third trimester is a time when the unborn baby (fetus) is growing rapidly. At the end of the ninth month, the fetus is about 20 inches in length and weighs 6-10 pounds. Body changes during your third trimester Your body will continue to go through many changes during pregnancy. The changes vary from woman to woman. During the third trimester:  Your weight will continue to increase. You can expect to gain 25-35 pounds (11-16 kg) by the end of the pregnancy.  You may begin to get stretch marks on your hips, abdomen, and breasts.  You may urinate more often because the fetus is moving lower into your pelvis and pressing on your bladder.  You may develop or continue to have heartburn. This is caused by increased hormones that slow down muscles in the digestive tract.  You may develop or continue to have constipation because increased hormones slow digestion and cause the muscles that push waste through your intestines to relax.  You may develop hemorrhoids. These are swollen veins (varicose veins) in the rectum that can itch or be painful.  You may develop swollen, bulging veins (varicose veins) in your legs.  You may have increased body aches in the pelvis, back, or thighs. This is due to weight gain and increased hormones that are relaxing your joints.  You may have changes in your hair. These can include thickening of your hair, rapid growth, and changes in texture. Some women also have hair loss during or after pregnancy, or hair that feels dry or thin. Your hair will most likely return to normal after your baby is born.  Your breasts will continue to grow and they will continue to become tender. A yellow fluid (colostrum) may leak from your breasts. This is the first milk you are producing for your baby.  Your belly button may stick out.  You may notice more swelling in your hands,  face, or ankles.  You may have increased tingling or numbness in your hands, arms, and legs. The skin on your belly may also feel numb.  You may feel short of breath because of your expanding uterus.  You may have more problems sleeping. This can be caused by the size of your belly, increased need to urinate, and an increase in your body's metabolism.  You may notice the fetus "dropping," or moving lower in your abdomen (lightening).  You may have increased vaginal discharge.  You may notice your joints feel loose and you may have pain around your pelvic bone.  What to expect at prenatal visits You will have prenatal exams every 2 weeks until week 36. Then you will have weekly prenatal exams. During a routine prenatal visit:  You will be weighed to make sure you and the baby are growing normally.  Your blood pressure will be taken.  Your abdomen will be measured to track your baby's growth.  The fetal heartbeat will be listened to.  Any test results from the previous visit will be discussed.  You may have a cervical check near your due date to see if your cervix has softened or thinned (effaced).  You will be tested for Group B streptococcus. This happens between 35 and 37 weeks.  Your health care provider may ask you:  What your birth plan is.  How you are feeling.  If you are feeling the baby move.  If you have had   any abnormal symptoms, such as leaking fluid, bleeding, severe headaches, or abdominal cramping.  If you are using any tobacco products, including cigarettes, chewing tobacco, and electronic cigarettes.  If you have any questions.  Other tests or screenings that may be performed during your third trimester include:  Blood tests that check for low iron levels (anemia).  Fetal testing to check the health, activity level, and growth of the fetus. Testing is done if you have certain medical conditions or if there are problems during the  pregnancy.  Nonstress test (NST). This test checks the health of your baby to make sure there are no signs of problems, such as the baby not getting enough oxygen. During this test, a belt is placed around your belly. The baby is made to move, and its heart rate is monitored during movement.  What is false labor? False labor is a condition in which you feel small, irregular tightenings of the muscles in the womb (contractions) that usually go away with rest, changing position, or drinking water. These are called Braxton Hicks contractions. Contractions may last for hours, days, or even weeks before true labor sets in. If contractions come at regular intervals, become more frequent, increase in intensity, or become painful, you should see your health care provider. What are the signs of labor?  Abdominal cramps.  Regular contractions that start at 10 minutes apart and become stronger and more frequent with time.  Contractions that start on the top of the uterus and spread down to the lower abdomen and back.  Increased pelvic pressure and dull back pain.  A watery or bloody mucus discharge that comes from the vagina.  Leaking of amniotic fluid. This is also known as your "water breaking." It could be a slow trickle or a gush. Let your health care provider know if it has a color or strange odor. If you have any of these signs, call your health care provider right away, even if it is before your due date. Follow these instructions at home: Medicines  Follow your health care provider's instructions regarding medicine use. Specific medicines may be either safe or unsafe to take during pregnancy.  Take a prenatal vitamin that contains at least 600 micrograms (mcg) of folic acid.  If you develop constipation, try taking a stool softener if your health care provider approves. Eating and drinking  Eat a balanced diet that includes fresh fruits and vegetables, whole grains, good sources of protein  such as meat, eggs, or tofu, and low-fat dairy. Your health care provider will help you determine the amount of weight gain that is right for you.  Avoid raw meat and uncooked cheese. These carry germs that can cause birth defects in the baby.  If you have low calcium intake from food, talk to your health care provider about whether you should take a daily calcium supplement.  Eat four or five small meals rather than three large meals a day.  Limit foods that are high in fat and processed sugars, such as fried and sweet foods.  To prevent constipation: ? Drink enough fluid to keep your urine clear or pale yellow. ? Eat foods that are high in fiber, such as fresh fruits and vegetables, whole grains, and beans. Activity  Exercise only as directed by your health care provider. Most women can continue their usual exercise routine during pregnancy. Try to exercise for 30 minutes at least 5 days a week. Stop exercising if you experience uterine contractions.  Avoid heavy   lifting.  Do not exercise in extreme heat or humidity, or at high altitudes.  Wear low-heel, comfortable shoes.  Practice good posture.  You may continue to have sex unless your health care provider tells you otherwise. Relieving pain and discomfort  Take frequent breaks and rest with your legs elevated if you have leg cramps or low back pain.  Take warm sitz baths to soothe any pain or discomfort caused by hemorrhoids. Use hemorrhoid cream if your health care provider approves.  Wear a good support bra to prevent discomfort from breast tenderness.  If you develop varicose veins: ? Wear support pantyhose or compression stockings as told by your healthcare provider. ? Elevate your feet for 15 minutes, 3-4 times a day. Prenatal care  Write down your questions. Take them to your prenatal visits.  Keep all your prenatal visits as told by your health care provider. This is important. Safety  Wear your seat belt at  all times when driving.  Make a list of emergency phone numbers, including numbers for family, friends, the hospital, and police and fire departments. General instructions  Avoid cat litter boxes and soil used by cats. These carry germs that can cause birth defects in the baby. If you have a cat, ask someone to clean the litter box for you.  Do not travel far distances unless it is absolutely necessary and only with the approval of your health care provider.  Do not use hot tubs, steam rooms, or saunas.  Do not drink alcohol.  Do not use any products that contain nicotine or tobacco, such as cigarettes and e-cigarettes. If you need help quitting, ask your health care provider.  Do not use any medicinal herbs or unprescribed drugs. These chemicals affect the formation and growth of the baby.  Do not douche or use tampons or scented sanitary pads.  Do not cross your legs for long periods of time.  To prepare for the arrival of your baby: ? Take prenatal classes to understand, practice, and ask questions about labor and delivery. ? Make a trial run to the hospital. ? Visit the hospital and tour the maternity area. ? Arrange for maternity or paternity leave through employers. ? Arrange for family and friends to take care of pets while you are in the hospital. ? Purchase a rear-facing car seat and make sure you know how to install it in your car. ? Pack your hospital bag. ? Prepare the baby's nursery. Make sure to remove all pillows and stuffed animals from the baby's crib to prevent suffocation.  Visit your dentist if you have not gone during your pregnancy. Use a soft toothbrush to brush your teeth and be gentle when you floss. Contact a health care provider if:  You are unsure if you are in labor or if your water has broken.  You become dizzy.  You have mild pelvic cramps, pelvic pressure, or nagging pain in your abdominal area.  You have lower back pain.  You have persistent  nausea, vomiting, or diarrhea.  You have an unusual or bad smelling vaginal discharge.  You have pain when you urinate. Get help right away if:  Your water breaks before 37 weeks.  You have regular contractions less than 5 minutes apart before 37 weeks.  You have a fever.  You are leaking fluid from your vagina.  You have spotting or bleeding from your vagina.  You have severe abdominal pain or cramping.  You have rapid weight loss or weight gain.    You have shortness of breath with chest pain.  You notice sudden or extreme swelling of your face, hands, ankles, feet, or legs.  Your baby makes fewer than 10 movements in 2 hours.  You have severe headaches that do not go away when you take medicine.  You have vision changes. Summary  The third trimester is from week 28 through week 40, months 7 through 9. The third trimester is a time when the unborn baby (fetus) is growing rapidly.  During the third trimester, your discomfort may increase as you and your baby continue to gain weight. You may have abdominal, leg, and back pain, sleeping problems, and an increased need to urinate.  During the third trimester your breasts will keep growing and they will continue to become tender. A yellow fluid (colostrum) may leak from your breasts. This is the first milk you are producing for your baby.  False labor is a condition in which you feel small, irregular tightenings of the muscles in the womb (contractions) that eventually go away. These are called Braxton Hicks contractions. Contractions may last for hours, days, or even weeks before true labor sets in.  Signs of labor can include: abdominal cramps; regular contractions that start at 10 minutes apart and become stronger and more frequent with time; watery or bloody mucus discharge that comes from the vagina; increased pelvic pressure and dull back pain; and leaking of amniotic fluid. This information is not intended to replace advice  given to you by your health care provider. Make sure you discuss any questions you have with your health care provider. Document Released: 07/30/2001 Document Revised: 01/11/2016 Document Reviewed: 10/06/2012 Elsevier Interactive Patient Education  2017 Elsevier Inc.  

## 2018-05-28 LAB — HIV ANTIBODY (ROUTINE TESTING W REFLEX): HIV Screen 4th Generation wRfx: NONREACTIVE

## 2018-05-28 LAB — GLUCOSE TOLERANCE, 2 HOURS W/ 1HR
GLUCOSE, FASTING: 76 mg/dL (ref 65–91)
Glucose, 1 hour: 161 mg/dL (ref 65–179)
Glucose, 2 hour: 98 mg/dL (ref 65–152)

## 2018-05-28 LAB — CBC
Hematocrit: 30.6 % — ABNORMAL LOW (ref 34.0–46.6)
Hemoglobin: 10.1 g/dL — ABNORMAL LOW (ref 11.1–15.9)
MCH: 29.1 pg (ref 26.6–33.0)
MCHC: 33 g/dL (ref 31.5–35.7)
MCV: 88 fL (ref 79–97)
PLATELETS: 255 10*3/uL (ref 150–450)
RBC: 3.47 x10E6/uL — AB (ref 3.77–5.28)
RDW: 14.7 % (ref 12.3–15.4)
WBC: 7.4 10*3/uL (ref 3.4–10.8)

## 2018-05-28 LAB — RPR: RPR Ser Ql: NONREACTIVE

## 2018-06-10 ENCOUNTER — Encounter: Payer: Medicaid Other | Admitting: Obstetrics & Gynecology

## 2018-06-24 ENCOUNTER — Encounter: Payer: Medicaid Other | Admitting: Advanced Practice Midwife

## 2018-07-02 ENCOUNTER — Encounter: Payer: Self-pay | Admitting: Obstetrics and Gynecology

## 2018-07-02 ENCOUNTER — Telehealth: Payer: Self-pay | Admitting: *Deleted

## 2018-07-02 ENCOUNTER — Other Ambulatory Visit (HOSPITAL_COMMUNITY)
Admission: RE | Admit: 2018-07-02 | Discharge: 2018-07-02 | Disposition: A | Discharge status: Home / Self Care | Payer: Medicaid Other | Source: Ambulatory Visit | Attending: Obstetrics & Gynecology | Admitting: Obstetrics & Gynecology

## 2018-07-02 ENCOUNTER — Ambulatory Visit (INDEPENDENT_AMBULATORY_CARE_PROVIDER_SITE_OTHER): Payer: Medicaid Other | Admitting: Obstetrics and Gynecology

## 2018-07-02 VITALS — BP 124/77 | HR 99 | Wt 178.0 lb

## 2018-07-02 DIAGNOSIS — N898 Other specified noninflammatory disorders of vagina: Secondary | ICD-10-CM

## 2018-07-02 DIAGNOSIS — Z3480 Encounter for supervision of other normal pregnancy, unspecified trimester: Secondary | ICD-10-CM | POA: Insufficient documentation

## 2018-07-02 DIAGNOSIS — Z641 Problems related to multiparity: Secondary | ICD-10-CM

## 2018-07-02 DIAGNOSIS — Z3483 Encounter for supervision of other normal pregnancy, third trimester: Secondary | ICD-10-CM

## 2018-07-02 MED ORDER — CLOTRIMAZOLE 1 % VA CREA: 1.0000 | TOPICAL_CREAM | Freq: Every day | VAGINAL | 2 refills | Status: DC

## 2018-07-02 NOTE — Progress Notes (Signed)
   PRENATAL VISIT NOTE  Subjective:  Cindy Cruz is a 32 y.o. [id number] at 4587w3d being seen today for ongoing prenatal care.  She is currently monitored for the following issues for this high-risk pregnancy and has Supervision of other normal pregnancy, antepartum; Trichomonal vaginitis during pregnancy in second trimester; and Grand multipara on their problem list.  Patient reports vaginal itching and discharge for about a week.  Contractions: Not present. Vag. Bleeding: None.  Movement: Present. Denies leaking of fluid.   The following portions of the patient's history were reviewed and updated as appropriate: allergies, current medications, past family history, past medical history, past social history, past surgical history and problem list. Problem list updated.  Objective:   Vitals:   07/02/18 1121  BP: 124/77  Pulse: 99  Weight: 178 lb (80.7 kg)    Fetal Status: Fetal Heart Rate (bpm): 131   Movement: Present     General:  Alert, oriented and cooperative. Patient is in no acute distress.  Skin: Skin is warm and dry. No rash noted.   Cardiovascular: Normal heart rate noted  Respiratory: Normal respiratory effort, no problems with respiration noted  Abdomen: Soft, gravid, appropriate for gestational age.  Pain/Pressure: Absent     Pelvic: Cervical exam deferred      SSE: thick white discharge in vagina  Extremities: Normal range of motion.  Edema: None  Mental Status: Normal mood and affect. Normal behavior. Normal judgment and thought content.   Assessment and Plan:  Pregnancy: [id number] at 4587w3d  1. Supervision of other normal pregnancy, antepartum Gave info on LARCs  2. Grand multipara  3. Vaginal discharge Wet prep Presumed yeast infection, sent script to pharamcy   Preterm labor symptoms and general obstetric precautions including but not limited to vaginal bleeding, contractions, leaking of fluid and fetal movement were reviewed in detail with the  patient. Please refer to After Visit Summary for other counseling recommendations.  Return in about 2 weeks (around 07/16/2018) for OB visit.  No future appointments.  Conan BowensKelly M Kyarah Enamorado, MD

## 2018-07-02 NOTE — Addendum Note (Signed)
Addended by: Marya LandryFOSTER, SUZANNE D on: 07/02/2018 11:56 AM   Modules accepted: Orders

## 2018-07-02 NOTE — Patient Instructions (Addendum)
Intrauterine Device Information An intrauterine device (IUD) is inserted into your uterus to prevent pregnancy. There are two types of IUDs available:  Copper IUD-This type of IUD is wrapped in copper wire and is placed inside the uterus. Copper makes the uterus and fallopian tubes produce a fluid that kills sperm. The copper IUD can stay in place for 10 years.  Hormone IUD-This type of IUD contains the hormone progestin (synthetic progesterone). The hormone thickens the cervical mucus and prevents sperm from entering the uterus. It also thins the uterine lining to prevent implantation of a fertilized egg. The hormone can weaken or kill the sperm that get into the uterus. One type of hormone IUD can stay in place for 5 years, and another type can stay in place for 3 years.  Your health care provider will make sure you are a good candidate for a contraceptive IUD. Discuss with your health care provider the possible side effects. Advantages of an intrauterine device  IUDs are highly effective, reversible, long acting, and low maintenance.  There are no estrogen-related side effects.  An IUD can be used when breastfeeding.  IUDs are not associated with weight gain.  The copper IUD works immediately after insertion.  The hormone IUD works right away if inserted within 7 days of your period starting. You will need to use a backup method of birth control for 7 days if the hormone IUD is inserted at any other time in your cycle.  The copper IUD does not interfere with your female hormones.  The hormone IUD can make heavy menstrual periods lighter and decrease cramping.  The hormone IUD can be used for 3 or 5 years.  The copper IUD can be used for 10 years. Disadvantages of an intrauterine device  The hormone IUD can be associated with irregular bleeding patterns.  The copper IUD can make your menstrual flow heavier and more painful.  You may experience cramping and vaginal bleeding after  insertion. This information is not intended to replace advice given to you by your health care provider. Make sure you discuss any questions you have with your health care provider. Document Released: 07/09/2004 Document Revised: 01/11/2016 Document Reviewed: 01/24/2013 Elsevier Interactive Patient Education  2017 [employer] Inc. Etonogestrel implant What is this medicine? ETONOGESTREL (et oh noe JES trel) is a contraceptive (birth control) device. It is used to prevent pregnancy. It can be used for up to 3 years. This medicine may be used for other purposes; ask your health care provider or pharmacist if you have questions. COMMON BRAND NAME(S): Implanon, Nexplanon What should I tell my health care provider before I take this medicine? They need to know if you have any of these conditions: -abnormal vaginal bleeding -blood vessel disease or blood clots -cancer of the breast, cervix, or liver -depression -diabetes -gallbladder disease -headaches -heart disease or recent heart attack -high blood pressure -high cholesterol -kidney disease -liver disease -renal disease -seizures -tobacco smoker -an unusual or allergic reaction to etonogestrel, other hormones, anesthetics or antiseptics, medicines, foods, dyes, or preservatives -pregnant or trying to get pregnant -breast-feeding How should I use this medicine? This device is inserted just under the skin on the inner side of your upper arm by a health care professional. Talk to your pediatrician regarding the use of this medicine in children. Special care may be needed. Overdosage: If you think you have taken too much of this medicine contact a poison control center or emergency room at once. NOTE: This medicine is   only for you. Do not share this medicine with others. What if I miss a dose? This does not apply. What may interact with this medicine? Do not take this medicine with any of the following  medications: -amprenavir -bosentan -fosamprenavir This medicine may also interact with the following medications: -barbiturate medicines for inducing sleep or treating seizures -certain medicines for fungal infections like ketoconazole and itraconazole -grapefruit juice -griseofulvin -medicines to treat seizures like carbamazepine, felbamate, oxcarbazepine, phenytoin, topiramate -modafinil -phenylbutazone -rifampin -rufinamide -some medicines to treat HIV infection like atazanavir, indinavir, lopinavir, nelfinavir, tipranavir, ritonavir -St. John's wort This list may not describe all possible interactions. Give your health care provider a list of all the medicines, herbs, non-prescription drugs, or dietary supplements you use. Also tell them if you smoke, drink alcohol, or use illegal drugs. Some items may interact with your medicine. What should I watch for while using this medicine? This product does not protect you against HIV infection (AIDS) or other sexually transmitted diseases. You should be able to feel the implant by pressing your fingertips over the skin where it was inserted. Contact your doctor if you cannot feel the implant, and use a non-hormonal birth control method (such as condoms) until your doctor confirms that the implant is in place. If you feel that the implant may have broken or become bent while in your arm, contact your healthcare provider. What side effects may I notice from receiving this medicine? Side effects that you should report to your doctor or health care professional as soon as possible: -allergic reactions like skin rash, itching or hives, swelling of the face, lips, or tongue -breast lumps -changes in emotions or moods -depressed mood -heavy or prolonged menstrual bleeding -pain, irritation, swelling, or bruising at the insertion site -scar at site of insertion -signs of infection at the insertion site such as fever, and skin redness, pain or  discharge -signs of pregnancy -signs and symptoms of a blood clot such as breathing problems; changes in vision; chest pain; severe, sudden headache; pain, swelling, warmth in the leg; trouble speaking; sudden numbness or weakness of the face, arm or leg -signs and symptoms of liver injury like dark yellow or brown urine; general ill feeling or flu-like symptoms; light-colored stools; loss of appetite; nausea; right upper belly pain; unusually weak or tired; yellowing of the eyes or skin -unusual vaginal bleeding, discharge -signs and symptoms of a stroke like changes in vision; confusion; trouble speaking or understanding; severe headaches; sudden numbness or weakness of the face, arm or leg; trouble walking; dizziness; loss of balance or coordination Side effects that usually do not require medical attention (report to your doctor or health care professional if they continue or are bothersome): -acne -back pain -breast pain -changes in weight -dizziness -general ill feeling or flu-like symptoms -headache -irregular menstrual bleeding -nausea -sore throat -vaginal irritation or inflammation This list may not describe all possible side effects. Call your doctor for medical advice about side effects. You may report side effects to FDA at 1-800-FDA-1088. Where should I keep my medicine? This drug is given in a hospital or clinic and will not be stored at home. NOTE: This sheet is a summary. It may not cover all possible information. If you have questions about this medicine, talk to your doctor, pharmacist, or health care provider.  2018 Elsevier/Gold Standard (2016-02-22 11:19:22)  

## 2018-07-02 NOTE — Telephone Encounter (Deleted)
Pt was seen today and states her medication clotrimazole (GYNE-LOTRIMIN) 1 % vaginal cream is not covered by medicaid. Please send something else in . Thank you

## 2018-07-03 LAB — CERVICOVAGINAL ANCILLARY ONLY
Bacterial vaginitis: NEGATIVE
CHLAMYDIA, DNA PROBE: NEGATIVE
Candida vaginitis: POSITIVE — AB
NEISSERIA GONORRHEA: NEGATIVE
TRICH (WINDOWPATH): NEGATIVE

## 2018-07-03 MED ORDER — TERCONAZOLE 0.8 % VA CREA: 1.0000 | TOPICAL_CREAM | Freq: Every day | VAGINAL | 0 refills | Status: DC

## 2018-07-03 NOTE — Telephone Encounter (Signed)
Pt LM stating Clotrimazole is not covered. Consulted with Dr. Earlene Plateravis. We will send Terconazole to the pharmacy. Pt is aware.

## 2018-07-21 ENCOUNTER — Encounter: Payer: Medicaid Other | Admitting: Obstetrics & Gynecology

## 2018-07-28 ENCOUNTER — Inpatient Hospital Stay (HOSPITAL_COMMUNITY)
Admission: AD | Admit: 2018-07-28 | Discharge: 2018-07-29 | Disposition: A | Discharge status: Home / Self Care | Payer: Medicaid Other | Source: Ambulatory Visit | Attending: Family Medicine | Admitting: Family Medicine

## 2018-07-28 DIAGNOSIS — O368130 Decreased fetal movements, third trimester, not applicable or unspecified: Secondary | ICD-10-CM | POA: Insufficient documentation

## 2018-07-28 DIAGNOSIS — F17210 Nicotine dependence, cigarettes, uncomplicated: Secondary | ICD-10-CM | POA: Insufficient documentation

## 2018-07-28 DIAGNOSIS — Z3480 Encounter for supervision of other normal pregnancy, unspecified trimester: Secondary | ICD-10-CM

## 2018-07-28 DIAGNOSIS — Z3689 Encounter for other specified antenatal screening: Secondary | ICD-10-CM

## 2018-07-28 DIAGNOSIS — O99333 Smoking (tobacco) complicating pregnancy, third trimester: Secondary | ICD-10-CM | POA: Insufficient documentation

## 2018-07-28 DIAGNOSIS — Z3A37 37 weeks gestation of pregnancy: Secondary | ICD-10-CM | POA: Insufficient documentation

## 2018-07-29 DIAGNOSIS — F17210 Nicotine dependence, cigarettes, uncomplicated: Secondary | ICD-10-CM | POA: Diagnosis not present

## 2018-07-29 DIAGNOSIS — O368130 Decreased fetal movements, third trimester, not applicable or unspecified: Secondary | ICD-10-CM | POA: Diagnosis present

## 2018-07-29 DIAGNOSIS — O99333 Smoking (tobacco) complicating pregnancy, third trimester: Secondary | ICD-10-CM | POA: Diagnosis not present

## 2018-07-29 DIAGNOSIS — Z3A37 37 weeks gestation of pregnancy: Secondary | ICD-10-CM

## 2018-07-29 NOTE — Discharge Instructions (Signed)

## 2018-07-29 NOTE — MAU Provider Note (Signed)
History     CSN: [id number]  Arrival date and time: 07/28/18 2353   First Provider Initiated Contact with Patient 07/29/18 0044      Chief Complaint  Patient presents with  . Decreased Fetal Movement   Cindy Cruz is a 32 y.o. [id number] at 6326w2d who presents today with decreased fetal movement. She states that prior to arrival she ate a large meal very quickly. Afterward she had a lot of epigastric pain. She states that at that time she noticed she wasn't feeling the baby move. When she called the nurse line she was told to come in. She states that as she was on the phone with them the abdominal pain subsided. She states that she has now been feeling normal movement. She denies any contractions, VB or LOF. She is reporting normal fetal movement at this time.    OB History    Gravida  7   Para  5   Term  5   Preterm  0   AB  1   Living  5     SAB  0   TAB  0   Ectopic  0   Multiple  0   Live Births  5           Past Medical History:  Diagnosis Date  . 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 Depression    ok now  . Trichomoniasis     Past Surgical History:  Procedure Laterality Date  . WISDOM TOOTH EXTRACTION      Family History  Problem Relation Age of Onset  . Hypertension Mother   . Cancer Maternal Aunt        breast  . Hearing loss Neg Hx     Social History   Tobacco Use  . Smoking status: Current Every Day Smoker    Packs/day: 0.25    Years: 10.00    Pack years: 2.50    Types: Cigarettes  . Smokeless tobacco: Never Used  Substance Use Topics  . Alcohol use: Not Currently    Comment: socially on occ.-liquor  . Drug use: No    Allergies: No Known Allergies  Medications Prior to Admission  Medication Sig Dispense Refill Last Dose  . clotrimazole (GYNE-LOTRIMIN) 1 % vaginal cream Place 1 Applicatorful vaginally at bedtime. 30 g 2   . omeprazole (PRILOSEC OTC) 20 MG tablet Take 1 tablet (20 mg total) by  mouth daily. 30 tablet 3 Taking  . Prenat-FeAsp-Meth-FA-DHA w/o A (PRENATE PIXIE) 10-0.6-0.4-200 MG CAPS Take 1 tablet by mouth daily. 30 capsule 11   . terconazole (TERAZOL 3) 0.8 % vaginal cream Place 1 applicator vaginally at bedtime. Apply nightly for three nights. 20 g 0     Review of Systems  Constitutional: Negative for chills and fever.  Gastrointestinal: Negative for nausea and vomiting.  Genitourinary: Negative for dysuria, pelvic pain, vaginal bleeding and vaginal discharge.   Physical Exam   Blood pressure 112/62, pulse 92, temperature 98.2 F (36.8 C), temperature source Oral, resp. rate 20, height 5\' 5"  (1.651 m), weight 79.9 kg, last menstrual period 11/05/2017, unknown if currently breastfeeding.  Physical Exam  Nursing note and vitals reviewed. Constitutional: She is oriented to person, place, and time. She appears well-developed and well-nourished. No distress.  HENT:  Head: Normocephalic.  Cardiovascular: Normal rate.  Respiratory: Effort normal.  Genitourinary:  Genitourinary Comments:  Dilation: Closed Effacement (%): Thick   Musculoskeletal: Normal range of motion.  Neurological: She is alert and oriented to person, place, and time.  Skin: Skin is warm and dry.  Psychiatric: She has a normal mood and affect.   NST:  Baseline: 130 Variability: moderate Accels: 15x15 Decels: none Toco: none   MAU Course  Procedures  MDM Patient reassured with NST and feeling regular movement.   Assessment and Plan   1. Decreased fetal movements in third trimester, single or unspecified fetus   2. Supervision of other normal pregnancy, antepartum   3. [redacted] weeks gestation of pregnancy   4. Non-stress test reactive    DC home Comfort measures reviewed  3rd Trimester precautions  Labor precautions  Fetal kick counts RX: none  Return to MAU as needed FU with OB as planned  Follow-up Information    [facility] Follow up.   Contact  information: [address] [place] [address] [phone]           Thressa Sheller 07/29/2018, 12:52 AM

## 2018-07-29 NOTE — MAU Note (Signed)
PT  SAYS BABY MOVING LESS- FHR  131.    NO UC'S.  NO VAG  BLEEDING. DENIES   HSV AND MRSA.Marland Kitchen.   LAST SEX-  SEPT.

## 2018-08-04 ENCOUNTER — Encounter: Payer: Medicaid Other | Admitting: Obstetrics & Gynecology

## 2018-08-07 ENCOUNTER — Encounter: Payer: Self-pay | Admitting: Obstetrics and Gynecology

## 2018-08-07 ENCOUNTER — Other Ambulatory Visit (HOSPITAL_COMMUNITY)
Admission: RE | Admit: 2018-08-07 | Discharge: 2018-08-07 | Disposition: A | Discharge status: Home / Self Care | Payer: Medicaid Other | Source: Ambulatory Visit | Attending: Obstetrics and Gynecology | Admitting: Obstetrics and Gynecology

## 2018-08-07 ENCOUNTER — Ambulatory Visit (INDEPENDENT_AMBULATORY_CARE_PROVIDER_SITE_OTHER): Payer: Medicaid Other | Admitting: Obstetrics and Gynecology

## 2018-08-07 VITALS — BP 106/70 | HR 94 | Wt 176.8 lb

## 2018-08-07 DIAGNOSIS — Z3483 Encounter for supervision of other normal pregnancy, third trimester: Secondary | ICD-10-CM

## 2018-08-07 DIAGNOSIS — Z641 Problems related to multiparity: Secondary | ICD-10-CM

## 2018-08-07 DIAGNOSIS — O23593 Infection of other part of genital tract in pregnancy, third trimester: Secondary | ICD-10-CM

## 2018-08-07 DIAGNOSIS — Z3490 Encounter for supervision of normal pregnancy, unspecified, unspecified trimester: Secondary | ICD-10-CM

## 2018-08-07 DIAGNOSIS — Z3480 Encounter for supervision of other normal pregnancy, unspecified trimester: Secondary | ICD-10-CM | POA: Diagnosis present

## 2018-08-07 DIAGNOSIS — O23592 Infection of other part of genital tract in pregnancy, second trimester: Secondary | ICD-10-CM

## 2018-08-07 DIAGNOSIS — A5901 Trichomonal vulvovaginitis: Secondary | ICD-10-CM

## 2018-08-07 DIAGNOSIS — Z3493 Encounter for supervision of normal pregnancy, unspecified, third trimester: Secondary | ICD-10-CM

## 2018-08-07 NOTE — Progress Notes (Signed)
    PRENATAL VISIT NOTE  Subjective:  Cindy Cruz is a 32 y.o. [id number] at 4519w4d being seen today for ongoing prenatal care.  She is currently monitored for the following issues for this high-risk pregnancy and has Supervision of other normal pregnancy, antepartum; Trichomonal vaginitis during pregnancy in second trimester; and Grand multipara on their problem list.  Patient reports lost mucous plug with a small amount of bleeding, has not continued.  Contractions: Not present. Vag. Bleeding: None.  Movement: Present. Denies leaking of fluid.   The following portions of the patient's history were reviewed and updated as appropriate: allergies, current medications, past family history, past medical history, past social history, past surgical history and problem list. Problem list updated.  Objective:   Vitals:   08/07/18 1031  BP: 106/70  Pulse: 94  Weight: 176 lb 12.8 oz (80.2 kg)    Fetal Status: Fetal Heart Rate (bpm): 130   Movement: Present     General:  Alert, oriented and cooperative. Patient is in no acute distress.  Skin: Skin is warm and dry. No rash noted.   Cardiovascular: Normal heart rate noted  Respiratory: Normal respiratory effort, no problems with respiration noted  Abdomen: Soft, gravid, appropriate for gestational age.  Pain/Pressure: Present     Pelvic: Cervical exam deferred        Extremities: Normal range of motion.  Edema: None  Mental Status: Normal mood and affect. Normal behavior. Normal judgment and thought content.   Assessment and Plan:  Pregnancy: [id number] at 5219w4d  1. Supervision of other normal pregnancy, antepartum GBS GC/CT today  2. Grand multipara  3. Trichomonal vaginitis during pregnancy in second trimester  Term labor symptoms and general obstetric precautions including but not limited to vaginal bleeding, contractions, leaking of fluid and fetal movement were reviewed in detail with the patient. Please refer to After Visit Summary  for other counseling recommendations.  Return in about 1 week (around 08/14/2018) for OB visit.  Future Appointments  Date Time Provider Department Center  08/20/2018  9:30 AM Marny LowensteinWenzel, Julie N, PA-C [facility] None    Conan BowensKelly M Sayid Moll, MD

## 2018-08-07 NOTE — Progress Notes (Signed)
Patient reports fetal movement, states she had a little spotting on Saturday but none since. Pt denies contractions.

## 2018-08-09 LAB — STREP GP B NAA: STREP GROUP B AG: NEGATIVE

## 2018-08-10 LAB — GC/CHLAMYDIA PROBE AMP (~~LOC~~) NOT AT ARMC
CHLAMYDIA, DNA PROBE: NEGATIVE
NEISSERIA GONORRHEA: NEGATIVE

## 2018-08-19 NOTE — L&D Delivery Note (Addendum)
Delivery Note At 2:37 PM a viable female was delivered via Vaginal, Spontaneous (Presentation: LOA).  APGAR: 8, 9; weight: pending.   Placenta status: delivered spontaneously, intact. Cord: 3-vessel with the following complications: none. Cord pH: N/A  Anesthesia:  Epidural Episiotomy: None Lacerations: None Suture Repair: None Est. Blood Loss (mL): 25 cc  Mom to postpartum.  Baby to Couplet care / Skin to Skin.  Dollene Cleveland 08/24/2018, 3:09 PM  Patient is a 33 y.o. now G7P6 s/p NSVD at [redacted]w[redacted]d, who was admitted for IOL for Postdates, grand multip.  I was gloved and present for delivery in its entirety. Viable female delivered by Somersault maneuver by CNM due to tight nuchal cord, no other complications.  She progressed with augmentation (pitocin) to complete and pushed to deliver.  Cord clamping delayed by several minutes then clamped by Resident and cut by Patient. Mom and baby stable prior to transfer to postpartum. She plans on breastfeeding. She requests nexplanon for birth control.  Sharyon Cable, CNM 08/24/18, 3:19 PM

## 2018-08-20 ENCOUNTER — Ambulatory Visit (INDEPENDENT_AMBULATORY_CARE_PROVIDER_SITE_OTHER): Payer: Medicaid Other | Admitting: Medical

## 2018-08-20 ENCOUNTER — Telehealth (HOSPITAL_COMMUNITY): Payer: Self-pay | Admitting: *Deleted

## 2018-08-20 ENCOUNTER — Encounter: Payer: Self-pay | Admitting: Medical

## 2018-08-20 VITALS — BP 127/81 | HR 97 | Wt 166.6 lb

## 2018-08-20 DIAGNOSIS — O480 Post-term pregnancy: Secondary | ICD-10-CM

## 2018-08-20 DIAGNOSIS — Z641 Problems related to multiparity: Secondary | ICD-10-CM

## 2018-08-20 DIAGNOSIS — Z3483 Encounter for supervision of other normal pregnancy, third trimester: Secondary | ICD-10-CM

## 2018-08-20 DIAGNOSIS — Z3480 Encounter for supervision of other normal pregnancy, unspecified trimester: Secondary | ICD-10-CM

## 2018-08-20 NOTE — Telephone Encounter (Signed)
Preadmission screen  

## 2018-08-20 NOTE — Patient Instructions (Signed)
Fetal Movement Counts Patient Name: ________________________________________________ Patient Due Date: ____________________ What is a fetal movement count?  A fetal movement count is the number of times that you feel your baby move during a certain amount of time. This may also be called a fetal kick count. A fetal movement count is recommended for every pregnant woman. You may be asked to start counting fetal movements as early as week 28 of your pregnancy. Pay attention to when your baby is most active. You may notice your baby's sleep and wake cycles. You may also notice things that make your baby move more. You should do a fetal movement count:  When your baby is normally most active.  At the same time each day. A good time to count movements is while you are resting, after having something to eat and drink. How do I count fetal movements? 1. Find a quiet, comfortable area. Sit, or lie down on your side. 2. Write down the date, the start time and stop time, and the number of movements that you felt between those two times. Take this information with you to your health care visits. 3. For 2 hours, count kicks, flutters, swishes, rolls, and jabs. You should feel at least 10 movements during 2 hours. 4. You may stop counting after you have felt 10 movements. 5. If you do not feel 10 movements in 2 hours, have something to eat and drink. Then, keep resting and counting for 1 hour. If you feel at least 4 movements during that hour, you may stop counting. Contact a health care provider if:  You feel fewer than 4 movements in 2 hours.  Your baby is not moving like he or she usually does. Date: ____________ Start time: ____________ Stop time: ____________ Movements: ____________ Date: ____________ Start time: ____________ Stop time: ____________ Movements: ____________ Date: ____________ Start time: ____________ Stop time: ____________ Movements: ____________ Date: ____________ Start time:  ____________ Stop time: ____________ Movements: ____________ Date: ____________ Start time: ____________ Stop time: ____________ Movements: ____________ Date: ____________ Start time: ____________ Stop time: ____________ Movements: ____________ Date: ____________ Start time: ____________ Stop time: ____________ Movements: ____________ Date: ____________ Start time: ____________ Stop time: ____________ Movements: ____________ Date: ____________ Start time: ____________ Stop time: ____________ Movements: ____________ This information is not intended to replace advice given to you by your health care provider. Make sure you discuss any questions you have with your health care provider. Document Released: 09/04/2006 Document Revised: 04/03/2016 Document Reviewed: 09/14/2015 Elsevier Interactive Patient Education  2019 [employer] Braxton Hicks Contractions Contractions of the uterus can occur throughout pregnancy, but they are not always a sign that you are in labor. You may have practice contractions called Braxton Hicks contractions. These false labor contractions are sometimes confused with true labor. What are Braxton Hicks contractions? Braxton Hicks contractions are tightening movements that occur in the muscles of the uterus before labor. Unlike true labor contractions, these contractions do not result in opening (dilation) and thinning of the cervix. Toward the end of pregnancy (32-34 weeks), Braxton Hicks contractions can happen more often and may become stronger. These contractions are sometimes difficult to tell apart from true labor because they can be very uncomfortable. You should not feel embarrassed if you go to the hospital with false labor. Sometimes, the only way to tell if you are in true labor is for your health care provider to look for changes in the cervix. The health care provider will do a physical exam and may monitor your contractions. If   you are not in true labor, the exam  should show that your cervix is not dilating and your water has not broken. If there are no other health problems associated with your pregnancy, it is completely safe for you to be sent home with false labor. You may continue to have Braxton Hicks contractions until you go into true labor. How to tell the difference between true labor and false labor True labor  Contractions last 30-70 seconds.  Contractions become very regular.  Discomfort is usually felt in the top of the uterus, and it spreads to the lower abdomen and low back.  Contractions do not go away with walking.  Contractions usually become more intense and increase in frequency.  The cervix dilates and gets thinner. False labor  Contractions are usually shorter and not as strong as true labor contractions.  Contractions are usually irregular.  Contractions are often felt in the front of the lower abdomen and in the groin.  Contractions may go away when you walk around or change positions while lying down.  Contractions get weaker and are shorter-lasting as time goes on.  The cervix usually does not dilate or become thin. Follow these instructions at home:   Take over-the-counter and prescription medicines only as told by your health care provider.  Keep up with your usual exercises and follow other instructions from your health care provider.  Eat and drink lightly if you think you are going into labor.  If Braxton Hicks contractions are making you uncomfortable: ? Change your position from lying down or resting to walking, or change from walking to resting. ? Sit and rest in a tub of warm water. ? Drink enough fluid to keep your urine pale yellow. Dehydration may cause these contractions. ? Do slow and deep breathing several times an hour.  Keep all follow-up prenatal visits as told by your health care provider. This is important. Contact a health care provider if:  You have a fever.  You have continuous  pain in your abdomen. Get help right away if:  Your contractions become stronger, more regular, and closer together.  You have fluid leaking or gushing from your vagina.  You pass blood-tinged mucus (bloody show).  You have bleeding from your vagina.  You have low back pain that you never had before.  You feel your baby's head pushing down and causing pelvic pressure.  Your baby is not moving inside you as much as it used to. Summary  Contractions that occur before labor are called Braxton Hicks contractions, false labor, or practice contractions.  Braxton Hicks contractions are usually shorter, weaker, farther apart, and less regular than true labor contractions. True labor contractions usually become progressively stronger and regular, and they become more frequent.  Manage discomfort from Braxton Hicks contractions by changing position, resting in a warm bath, drinking plenty of water, or practicing deep breathing. This information is not intended to replace advice given to you by your health care provider. Make sure you discuss any questions you have with your health care provider. Document Released: 12/19/2016 Document Revised: 05/20/2017 Document Reviewed: 12/19/2016 Elsevier Interactive Patient Education  2019 Elsevier Inc.  

## 2018-08-20 NOTE — Addendum Note (Signed)
Addended by: Marny LowensteinWENZEL, JULIE N on: 08/20/2018 11:17 AM   Modules accepted: Orders, SmartSet

## 2018-08-20 NOTE — Progress Notes (Signed)
   PRENATAL VISIT NOTE  Subjective:  Cindy Cruz is a 33 y.o. [id number] at [redacted]w[redacted]d being seen today for ongoing prenatal care.  She is currently monitored for the following issues for this low-risk pregnancy and has Supervision of other normal pregnancy, antepartum; Trichomonal vaginitis during pregnancy in second trimester; and Grand multipara on their problem list.  Patient reports occasional contractions.  Contractions: Irregular. Vag. Bleeding: None.  Movement: Present. Denies leaking of fluid.   The following portions of the patient's history were reviewed and updated as appropriate: allergies, current medications, past family history, past medical history, past social history, past surgical history and problem list. Problem list updated.  Objective:   Vitals:   08/20/18 1003  BP: 127/81  Pulse: 97  Weight: 166 lb 9.6 oz (75.6 kg)    Fetal Status: Fetal Heart Rate (bpm): 145 Fundal Height: 39 cm Movement: Present  Presentation: Vertex  General:  Alert, oriented and cooperative. Patient is in no acute distress.  Skin: Skin is warm and dry. No rash noted.   Cardiovascular: Normal heart rate noted  Respiratory: Normal respiratory effort, no problems with respiration noted  Abdomen: Soft, gravid, appropriate for gestational age.  Pain/Pressure: Present     Pelvic: Cervical exam performed Dilation: 1 Effacement (%): 50 Station: -1  Extremities: Normal range of motion.  Edema: None  Mental Status: Normal mood and affect. Normal behavior. Normal judgment and thought content.   Fetal Monitoring: Baseline: 130 bpm Variability: moderate Accelerations: 15 x 15 Decelerations: none Contractions: few, irregular  Assessment and Plan:  Pregnancy: G3F5825 at [redacted]w[redacted]d  1. Supervision of other normal pregnancy, antepartum  2. Grand multipara  3. Post-term pregnancy, 40-42 weeks of gestation - NST today  - IOL scheduled today  Term labor symptoms and general obstetric precautions  including but not limited to vaginal bleeding, contractions, leaking of fluid and fetal movement were reviewed in detail with the patient. Please refer to After Visit Summary for other counseling recommendations.  Return in about 5 weeks (around 09/24/2018) for PP visit.  Vonzella Nipple, PA-C

## 2018-08-20 NOTE — Progress Notes (Signed)
Pt is here for ROB. 6971w3d. NST today. Schedule induction today.

## 2018-08-24 ENCOUNTER — Inpatient Hospital Stay (HOSPITAL_COMMUNITY): Payer: Medicaid Other | Admitting: Anesthesiology

## 2018-08-24 ENCOUNTER — Encounter (HOSPITAL_COMMUNITY): Payer: Self-pay

## 2018-08-24 ENCOUNTER — Inpatient Hospital Stay (HOSPITAL_COMMUNITY)
Admission: RE | Admit: 2018-08-24 | Discharge: 2018-08-26 | DRG: 807 | Disposition: A | Discharge status: Home / Self Care | Payer: Medicaid Other | Attending: Obstetrics and Gynecology | Admitting: Obstetrics and Gynecology

## 2018-08-24 DIAGNOSIS — Z3A41 41 weeks gestation of pregnancy: Secondary | ICD-10-CM

## 2018-08-24 DIAGNOSIS — O9962 Diseases of the digestive system complicating childbirth: Secondary | ICD-10-CM | POA: Diagnosis present

## 2018-08-24 DIAGNOSIS — K219 Gastro-esophageal reflux disease without esophagitis: Secondary | ICD-10-CM | POA: Diagnosis present

## 2018-08-24 DIAGNOSIS — O480 Post-term pregnancy: Principal | ICD-10-CM | POA: Diagnosis present

## 2018-08-24 DIAGNOSIS — O99334 Smoking (tobacco) complicating childbirth: Secondary | ICD-10-CM | POA: Diagnosis present

## 2018-08-24 DIAGNOSIS — F17210 Nicotine dependence, cigarettes, uncomplicated: Secondary | ICD-10-CM | POA: Diagnosis present

## 2018-08-24 DIAGNOSIS — Z641 Problems related to multiparity: Secondary | ICD-10-CM

## 2018-08-24 HISTORY — DX: Gastro-esophageal reflux disease without esophagitis: K21.9

## 2018-08-24 LAB — RPR: RPR Ser Ql: NONREACTIVE

## 2018-08-24 LAB — CBC
HEMATOCRIT: 31.7 % — AB (ref 36.0–46.0)
Hemoglobin: 10.2 g/dL — ABNORMAL LOW (ref 12.0–15.0)
MCH: 28.1 pg (ref 26.0–34.0)
MCHC: 32.2 g/dL (ref 30.0–36.0)
MCV: 87.3 fL (ref 80.0–100.0)
Platelets: 197 10*3/uL (ref 150–400)
RBC: 3.63 MIL/uL — ABNORMAL LOW (ref 3.87–5.11)
RDW: 16.6 % — AB (ref 11.5–15.5)
WBC: 8.4 10*3/uL (ref 4.0–10.5)
nRBC: 0 % (ref 0.0–0.2)

## 2018-08-24 LAB — TYPE AND SCREEN
ABO/RH(D): O POS
ANTIBODY SCREEN: NEGATIVE

## 2018-08-24 MED ORDER — LIDOCAINE HCL (PF) 1 % IJ SOLN
INTRAMUSCULAR | Status: DC | PRN
  Administered 2018-08-24 (×2): 5 mL via EPIDURAL

## 2018-08-24 MED ORDER — DIBUCAINE 1 % RE OINT: 1.0000 "application " | TOPICAL_OINTMENT | RECTAL | Status: DC | PRN

## 2018-08-24 MED ORDER — ACETAMINOPHEN 325 MG PO TABS
650.0000 mg | ORAL_TABLET | ORAL | Status: DC | PRN
  Administered 2018-08-25: 650 mg via ORAL
  Administered 2018-08-25: 325 mg via ORAL
  Administered 2018-08-25: 650 mg via ORAL
  Filled 2018-08-24 (×3): qty 2

## 2018-08-24 MED ORDER — FENTANYL 2.5 MCG/ML BUPIVACAINE 1/10 % EPIDURAL INFUSION (WH - ANES)
14.0000 mL/h | INTRAMUSCULAR | Status: DC | PRN
  Administered 2018-08-24: 14 mL/h via EPIDURAL
  Filled 2018-08-24: qty 100

## 2018-08-24 MED ORDER — FLEET ENEMA 7-19 GM/118ML RE ENEM: 1.0000 | ENEMA | RECTAL | Status: DC | PRN

## 2018-08-24 MED ORDER — COCONUT OIL OIL
1.0000 "application " | TOPICAL_OIL | Status: DC | PRN
  Administered 2018-08-24 – 2018-08-25 (×2): 1 via TOPICAL
  Filled 2018-08-24 (×2): qty 120

## 2018-08-24 MED ORDER — ONDANSETRON HCL 4 MG PO TABS: 4.0000 mg | ORAL_TABLET | ORAL | Status: DC | PRN

## 2018-08-24 MED ORDER — OXYTOCIN BOLUS FROM INFUSION
500.0000 mL | Freq: Once | INTRAVENOUS | Status: AC
  Administered 2018-08-24: 500 mL via INTRAVENOUS

## 2018-08-24 MED ORDER — LACTATED RINGERS IV SOLN: 500.0000 mL | INTRAVENOUS | Status: DC | PRN

## 2018-08-24 MED ORDER — OXYCODONE-ACETAMINOPHEN 5-325 MG PO TABS: 2.0000 | ORAL_TABLET | ORAL | Status: DC | PRN

## 2018-08-24 MED ORDER — ONDANSETRON HCL 4 MG/2ML IJ SOLN: 4.0000 mg | INTRAMUSCULAR | Status: DC | PRN

## 2018-08-24 MED ORDER — LACTATED RINGERS IV SOLN: 500.0000 mL | Freq: Once | INTRAVENOUS | Status: DC

## 2018-08-24 MED ORDER — LIDOCAINE HCL (PF) 1 % IJ SOLN
30.0000 mL | INTRAMUSCULAR | Status: DC | PRN
  Filled 2018-08-24: qty 30

## 2018-08-24 MED ORDER — IBUPROFEN 600 MG PO TABS
600.0000 mg | ORAL_TABLET | Freq: Four times a day (QID) | ORAL | Status: DC
  Administered 2018-08-24 – 2018-08-26 (×8): 600 mg via ORAL
  Filled 2018-08-24 (×8): qty 1

## 2018-08-24 MED ORDER — ACETAMINOPHEN 325 MG PO TABS: 650.0000 mg | ORAL_TABLET | ORAL | Status: DC | PRN

## 2018-08-24 MED ORDER — DIPHENHYDRAMINE HCL 50 MG/ML IJ SOLN: 12.5000 mg | INTRAMUSCULAR | Status: DC | PRN

## 2018-08-24 MED ORDER — DIPHENHYDRAMINE HCL 25 MG PO CAPS: 25.0000 mg | ORAL_CAPSULE | Freq: Four times a day (QID) | ORAL | Status: DC | PRN

## 2018-08-24 MED ORDER — PRENATAL MULTIVITAMIN CH
1.0000 | ORAL_TABLET | Freq: Every day | ORAL | Status: DC
  Administered 2018-08-25 – 2018-08-26 (×2): 1 via ORAL
  Filled 2018-08-24 (×2): qty 1

## 2018-08-24 MED ORDER — SENNOSIDES-DOCUSATE SODIUM 8.6-50 MG PO TABS
2.0000 | ORAL_TABLET | ORAL | Status: DC
  Administered 2018-08-24 – 2018-08-25 (×2): 2 via ORAL
  Filled 2018-08-24 (×2): qty 2

## 2018-08-24 MED ORDER — OXYCODONE-ACETAMINOPHEN 5-325 MG PO TABS
1.0000 | ORAL_TABLET | ORAL | Status: DC | PRN
  Administered 2018-08-24 – 2018-08-26 (×5): 1 via ORAL
  Filled 2018-08-24 (×5): qty 1

## 2018-08-24 MED ORDER — TERBUTALINE SULFATE 1 MG/ML IJ SOLN
0.2500 mg | Freq: Once | INTRAMUSCULAR | Status: DC | PRN
  Filled 2018-08-24: qty 1

## 2018-08-24 MED ORDER — SOD CITRATE-CITRIC ACID 500-334 MG/5ML PO SOLN: 30.0000 mL | ORAL | Status: DC | PRN

## 2018-08-24 MED ORDER — PHENYLEPHRINE 40 MCG/ML (10ML) SYRINGE FOR IV PUSH (FOR BLOOD PRESSURE SUPPORT)
80.0000 ug | PREFILLED_SYRINGE | INTRAVENOUS | Status: DC | PRN
  Filled 2018-08-24 (×2): qty 10

## 2018-08-24 MED ORDER — PHENYLEPHRINE 40 MCG/ML (10ML) SYRINGE FOR IV PUSH (FOR BLOOD PRESSURE SUPPORT)
80.0000 ug | PREFILLED_SYRINGE | INTRAVENOUS | Status: DC | PRN
  Filled 2018-08-24: qty 10

## 2018-08-24 MED ORDER — TETANUS-DIPHTH-ACELL PERTUSSIS 5-2.5-18.5 LF-MCG/0.5 IM SUSP: 0.5000 mL | Freq: Once | INTRAMUSCULAR | Status: DC

## 2018-08-24 MED ORDER — BENZOCAINE-MENTHOL 20-0.5 % EX AERO: 1.0000 "application " | INHALATION_SPRAY | CUTANEOUS | Status: DC | PRN

## 2018-08-24 MED ORDER — EPHEDRINE 5 MG/ML INJ
10.0000 mg | INTRAVENOUS | Status: DC | PRN
  Filled 2018-08-24: qty 2

## 2018-08-24 MED ORDER — OXYTOCIN 40 UNITS IN LACTATED RINGERS INFUSION - SIMPLE MED
2.5000 [IU]/h | INTRAVENOUS | Status: DC
  Filled 2018-08-24: qty 1000

## 2018-08-24 MED ORDER — ZOLPIDEM TARTRATE 5 MG PO TABS: 5.0000 mg | ORAL_TABLET | Freq: Every evening | ORAL | Status: DC | PRN

## 2018-08-24 MED ORDER — FENTANYL CITRATE (PF) 100 MCG/2ML IJ SOLN
100.0000 ug | INTRAMUSCULAR | Status: DC | PRN
  Administered 2018-08-24: 100 ug via INTRAVENOUS
  Filled 2018-08-24: qty 2

## 2018-08-24 MED ORDER — ONDANSETRON HCL 4 MG/2ML IJ SOLN
4.0000 mg | Freq: Four times a day (QID) | INTRAMUSCULAR | Status: DC | PRN
  Administered 2018-08-24: 4 mg via INTRAVENOUS
  Filled 2018-08-24: qty 2

## 2018-08-24 MED ORDER — OXYCODONE-ACETAMINOPHEN 5-325 MG PO TABS: 1.0000 | ORAL_TABLET | ORAL | Status: DC | PRN

## 2018-08-24 MED ORDER — SIMETHICONE 80 MG PO CHEW: 80.0000 mg | CHEWABLE_TABLET | ORAL | Status: DC | PRN

## 2018-08-24 MED ORDER — WITCH HAZEL-GLYCERIN EX PADS: 1.0000 "application " | MEDICATED_PAD | CUTANEOUS | Status: DC | PRN

## 2018-08-24 MED ORDER — LACTATED RINGERS IV SOLN
INTRAVENOUS | Status: DC
  Administered 2018-08-24: 08:00:00 via INTRAVENOUS

## 2018-08-24 MED ORDER — OXYTOCIN 40 UNITS IN LACTATED RINGERS INFUSION - SIMPLE MED
1.0000 m[IU]/min | INTRAVENOUS | Status: DC
  Administered 2018-08-24: 2 m[IU]/min via INTRAVENOUS

## 2018-08-24 MED ORDER — MISOPROSTOL 25 MCG QUARTER TABLET: 25.0000 ug | ORAL_TABLET | ORAL | Status: DC | PRN

## 2018-08-24 NOTE — Anesthesia Preprocedure Evaluation (Signed)
Anesthesia Evaluation  Patient identified by MRN, date of birth, ID band Patient awake    Reviewed: Allergy & Precautions, NPO status , Patient's Chart, lab work & pertinent test results  History of Anesthesia Complications Negative for: history of anesthetic complications  Airway Mallampati: II  TM Distance: >3 FB Neck ROM: Full    Dental no notable dental hx. (+) Dental Advisory Given   Pulmonary neg pulmonary ROS, Current Smoker,    Pulmonary exam normal        Cardiovascular negative cardio ROS Normal cardiovascular exam     Neuro/Psych PSYCHIATRIC DISORDERS Depression negative neurological ROS     GI/Hepatic Neg liver ROS, GERD  ,  Endo/Other  negative endocrine ROS  Renal/GU negative Renal ROS  negative genitourinary   Musculoskeletal negative musculoskeletal ROS (+)   Abdominal   Peds negative pediatric ROS (+)  Hematology negative hematology ROS (+)   Anesthesia Other Findings   Reproductive/Obstetrics negative OB ROS                             Anesthesia Physical Anesthesia Plan  ASA: II  Anesthesia Plan: Epidural   Post-op Pain Management:    Induction:   PONV Risk Score and Plan: Treatment may vary due to age or medical condition  Airway Management Planned: Natural Airway  Additional Equipment:   Intra-op Plan:   Post-operative Plan:   Informed Consent: I have reviewed the patients History and Physical, chart, labs and discussed the procedure including the risks, benefits and alternatives for the proposed anesthesia with the patient or authorized representative who has indicated his/her understanding and acceptance.   Dental advisory given  Plan Discussed with: CRNA and Anesthesiologist  Anesthesia Plan Comments:         Anesthesia Quick Evaluation

## 2018-08-24 NOTE — Anesthesia Postprocedure Evaluation (Signed)
Anesthesia Post Note  Patient: Cindy Cruz  Procedure(s) Performed: AN AD HOC LABOR EPIDURAL     Patient location during evaluation: Mother Baby Anesthesia Type: Epidural Level of consciousness: awake Pain management: satisfactory to patient Vital Signs Assessment: post-procedure vital signs reviewed and stable Respiratory status: spontaneous breathing Cardiovascular status: stable Anesthetic complications: no    Last Vitals:  Vitals:   08/24/18 1645 08/24/18 1741  BP: 116/77 110/72  Pulse: 88 85  Resp: 18 19  Temp: 36.8 C 36.8 C  SpO2:      Last Pain:  Vitals:   08/24/18 1745  TempSrc:   PainSc: 2    Pain Goal: Patients Stated Pain Goal: 5 (08/24/18 0724)               Cephus Shelling

## 2018-08-24 NOTE — Lactation Note (Signed)
This note was copied from a baby's chart. Lactation Consultation Note  Patient Name: Cindy Cruz NWGNF'AToday's Date: 08/24/2018 Reason for consult: Initial assessment;Term  Visited with P6 Mom of 3 hr old term baby.  Mom not clear about her breastfeeding history, but said she always gave formula when she breastfed.  Mom states she wants to do both again.    Baby sleeping swaddled in crib, and has latched and breastfed twice already.  Recommended baby be STS to encourage more breastfeeding, and to help baby transition after birth.  Mom was quick to do this.  Tried briefly to latch baby in football hold, reviewed using good support and latching baby with a deep latch.  Baby too sleepy to latch.  Mom happy to keep baby STS on her chest.  Reviewed importance of frequent feedings, goal of 8-12 feedings per 24 hrs. Mom to call prn for assistance.       Lactation brochure given to Mom.  Mom aware of IP and OP support available to her.    Maternal Data Formula Feeding for Exclusion: Yes Reason for exclusion: Mother's choice to formula and breast feed on admission Has patient been taught Hand Expression?: Yes Does the patient have breastfeeding experience prior to this delivery?: Yes  Feeding Feeding Type: Breast Fed  LATCH Score Latch: Too sleepy or reluctant, no latch achieved, no sucking elicited.  Audible Swallowing: None  Type of Nipple: Everted at rest and after stimulation  Comfort (Breast/Nipple): Soft / non-tender  Hold (Positioning): Assistance needed to correctly position infant at breast and maintain latch.  LATCH Score: 5  Interventions Interventions: Breast feeding basics reviewed;Assisted with latch;Skin to skin;Breast massage;Hand express;Support pillows;Adjust position;Position options  Lactation Tools Discussed/Used WIC Program: Yes   Consult Status Consult Status: Follow-up Date: 08/25/18 Follow-up type: In-patient    Cindy Cruz, Ej Pinson E 08/24/2018, 5:39  PM

## 2018-08-24 NOTE — Anesthesia Pain Management Evaluation Note (Signed)
  CRNA Pain Management Visit Note  Patient: Cindy Cruz, 33 y.o., female  "Hello I am a member of the anesthesia team at [facility]. We have an anesthesia team available at all times to provide care throughout the hospital, including epidural management and anesthesia for C-section. I don't know your plan for the delivery whether it a natural birth, water birth, IV sedation, nitrous supplementation, doula or epidural, but we want to meet your pain goals."   1.Was your pain managed to your expectations on prior hospitalizations?   Yes   2.What is your expectation for pain management during this hospitalization?     Epidural  3.How can we help you reach that goal?   Record the patient's initial score and the patient's pain goal.   Pain: 3  Pain Goal: 5 [facility] wants you to be able to say your pain was always managed very well.  Laban Emperor 08/24/2018

## 2018-08-24 NOTE — Anesthesia Procedure Notes (Signed)
Epidural Patient location during procedure: OB Start time: 08/24/2018 1:11 PM End time: 08/24/2018 1:25 PM  Staffing Anesthesiologist: Heather Roberts, MD Performed: anesthesiologist and resident/CRNA   Preanesthetic Checklist Completed: patient identified, site marked, pre-op evaluation, timeout performed, IV checked, risks and benefits discussed and monitors and equipment checked  Epidural Patient position: sitting Prep: DuraPrep Patient monitoring: heart rate, cardiac monitor, continuous pulse ox and blood pressure Approach: midline Location: L2-L3 Injection technique: LOR saline  Needle:  Needle type: Tuohy  Needle gauge: 17 G Needle length: 9 cm Needle insertion depth: 6 cm Catheter size: 20 Guage Catheter at skin depth: 11 cm Test dose: negative and Other  Assessment Events: blood not aspirated, injection not painful, no injection resistance and negative IV test  Additional Notes Informed consent obtained prior to proceeding including risk of failure, 1% risk of PDPH, risk of minor discomfort and bruising.  Discussed rare but serious complications including epidural abscess, permanent nerve injury, epidural hematoma.  Discussed alternatives to epidural analgesia and patient desires to proceed.  Timeout performed pre-procedure verifying patient name, procedure, and platelet count.  Patient tolerated procedure well. SRNA performed the procedure.

## 2018-08-24 NOTE — Discharge Summary (Addendum)
Postpartum Discharge Summary     Patient Name: Cindy Cruz DOB: 10-28-1985 MRN: [id number]  Date of admission: 08/24/2018 Delivering Provider: Dollene ClevelandANDERSON, HANNAH Cruz   Date of discharge: 08/25/2018  Admitting diagnosis: 41wks, induction Intrauterine pregnancy: 4069w0d     Secondary diagnosis:  Active Problems:   Post-dates pregnancy   SVD (spontaneous vaginal delivery)  Additional problems: None     Discharge diagnosis: Term Pregnancy Delivered                                                                                                Post partum procedures:none  Augmentation: Pitocin  Complications: ROM>24 hours  Hospital course:  Induction of Labor With Vaginal Delivery   33 y.o. yo [id number] at 6969w0d was admitted to the hospital 08/24/2018 for induction of labor.  Indication for induction: Postdates.  Patient had an uncomplicated labor course as follows: Patient admitted on 1/7 as an IOL for PD. Patient started on pitocin as she was 2.5cm dilation on arrival. She progressed to complete qiuickly. Baby had a tight nuchal cord which was reduced via somersault maneuver.  Patient had routine post-partum course with no issues. On PPD#1 she was ambulating, eating, urinating, and having BM. She requested DC on that day and was discharged contingent on her baby also being discharged.    Membrane Rupture Time/Date: 7:00 PM ,08/20/2018   Intrapartum Procedures: Episiotomy: None [1]                                         Lacerations:  None [1]  Patient had delivery of a Viable infant.  Information for the patient's newborn:  Cindy Cruz, Cindy Cruz [[id number]][[id number]]      08/24/2018  Details of delivery can be found in separate delivery note.  Patient had a routine postpartum course. Patient is discharged home 08/25/18.  Magnesium Sulfate recieved: No BMZ received: No  Physical exam  Vitals:   08/24/18 1741 08/24/18 2122 08/25/18 0106 08/25/18 0522  BP: 110/72 110/72 113/76 112/89  Pulse:  85 76 70 72  Resp: 19 18 18 18   Temp: 98.2 F (36.8 Cruz) 97.7 F (36.5 Cruz) (!) 97.5 F (36.4 Cruz) 98.2 F (36.8 Cruz)  TempSrc: Oral Oral Oral Oral  SpO2:      Weight:      Height:       General: alert, cooperative and no distress Lochia: appropriate Uterine Fundus: firm Incision: N/A DVT Evaluation: No evidence of DVT seen on physical exam. Negative Homan's sign. No cords or calf tenderness. Labs: Lab Results  Component Value Date   WBC 11.6 (H) 08/25/2018   HGB 9.6 (L) 08/25/2018   HCT 29.7 (L) 08/25/2018   MCV 87.4 08/25/2018   PLT 200 08/25/2018   No flowsheet data found.  Discharge instruction: per After Visit Summary and "Baby and Me Booklet".  After visit meds:  Allergies as of 08/25/2018   No Known Allergies     Medication List    STOP taking these medications  clotrimazole 1 % vaginal cream Commonly known as:  GYNE-LOTRIMIN   omeprazole 20 MG tablet Commonly known as:  PRILOSEC OTC   PRENATE PIXIE 10-0.6-0.4-200 MG Caps   terconazole 0.8 % vaginal cream Commonly known as:  TERAZOL 3     TAKE these medications   ibuprofen 600 MG tablet Commonly known as:  ADVIL,MOTRIN Take 1 tablet (600 mg total) by mouth every 6 (six) hours.       Diet: routine diet  Activity: Advance as tolerated. Pelvic rest for 6 weeks.   Outpatient follow up:4 weeks Follow up Appt:No future appointments. Follow up Visit: Follow-up Information    [facility].   Specialty:  Obstetrics and Gynecology Why:  Follow up in 4-6 weeks for a postpartum visit Contact information: [address], [address] [place] [id number] [phone]         Please schedule this patient for Postpartum visit in: 4 weeks with the following provider: Clearance Cruz For Cruz/S patients schedule nurse incision check in weeks 2 weeks: no Low risk pregnancy complicated by: nothing Delivery mode:  SVD Anticipated Birth Control:  Cindy Cruz PP  Procedures needed: Cindy Cruz placement   Schedule Integrated BH visit: no     Newborn Data: Live born female  Birth Weight:   APGAR: 8, 9  Newborn Delivery   Birth date/time:  08/24/2018 14:37:00 Delivery type:  Vaginal, Spontaneous     Baby Feeding: Bottle and Breast Disposition:home with mother   08/25/2018 Cindy BuddyJacob Fletcher, MD   OB FELLOW DISCHARGE ATTESTATION  I have seen and examined this patient and agree with above documentation in the resident's note.   Cindy Cruz, D.O. OB Fellow  08/25/2018, 10:10 AM

## 2018-08-24 NOTE — H&P (Signed)
LABOR AND DELIVERY ADMISSION HISTORY AND PHYSICAL NOTE  Cindy Cruz is a 33 y.o. female [phone] with IUP at 2389w0d by LMP presenting for IOL for PD. She reports positive fetal movement. She denies vaginal bleeding. She reports leaking of fluid since 08/20/2017- clear fluid.   [facility] Pregnancy complications:  - Trich during second trimester, negative TOC   Past Medical History: Past Medical History:  Diagnosis Date  . 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 Depression    ok now  . GERD (gastroesophageal reflux disease)    indigestion with pregnancy only; Rx med helps; pt cannot remember name of med  . Trichomoniasis     Past Surgical History: Past Surgical History:  Procedure Laterality Date  . WISDOM TOOTH EXTRACTION      Obstetrical History: OB History    Gravida  7   Para  5   Term  5   Preterm  0   AB  1   Living  5     SAB  0   TAB  0   Ectopic  0   Multiple  0   Live Births  5           Social History: Social History   Socioeconomic History  . Marital status: Single    Spouse name: Not on file  . Number of children: Not on file  . Years of education: Not on file  . Highest education level: Not on file  Occupational History  . Not on file  Social Needs  . Financial resource strain: Not on file  . Food insecurity:    Worry: Not on file    Inability: Not on file  . Transportation needs:    Medical: Not on file    Non-medical: Not on file  Tobacco Use  . Smoking status: Current Every Day Smoker    Packs/day: 0.25    Years: 10.00    Pack years: 2.50    Types: Cigarettes  . Smokeless tobacco: Never Used  Substance and Sexual Activity  . Alcohol use: Not Currently    Comment: socially on occ.-liquor  . Drug use: No  . Sexual activity: Yes    Birth control/protection: None  Lifestyle  . Physical activity:    Days per week: Not on file    Minutes per session: Not on  file  . Stress: Not on file  Relationships  . Social connections:    Talks on phone: Not on file    Gets together: Not on file    Attends religious service: Not on file    Active member of club or organization: Not on file    Attends meetings of clubs or organizations: Not on file    Relationship status: Not on file  Other Topics Concern  . Not on file  Social History Narrative  . Not on file    Family History: Family History  Problem Relation Age of Onset  . Hypertension Mother   . Cancer Maternal Aunt        breast  . Hearing loss Neg Hx     Allergies: No Known Allergies  Medications Prior to Admission  Medication Sig Dispense Refill Last Dose  . clotrimazole (GYNE-LOTRIMIN) 1 % vaginal cream Place 1 Applicatorful vaginally at bedtime. (Patient not taking: Reported on 08/07/2018) 30 g 2 Not Taking  . omeprazole (PRILOSEC OTC) 20 MG tablet Take 1 tablet (20 mg total) by mouth  daily. 30 tablet 3 Taking  . Prenat-FeAsp-Meth-FA-DHA w/o A (PRENATE PIXIE) 10-0.6-0.4-200 MG CAPS Take 1 tablet by mouth daily. 30 capsule 11 Taking  . terconazole (TERAZOL 3) 0.8 % vaginal cream Place 1 applicator vaginally at bedtime. Apply nightly for three nights. (Patient not taking: Reported on 08/07/2018) 20 g 0 Not Taking     Review of Systems  All systems reviewed and negative except as stated in HPI  Physical Exam Blood pressure 101/60, pulse 92, temperature 99.1 F (37.3 C), temperature source Oral, resp. rate 20, height 5\' 5"  (1.651 m), weight 79.2 kg, last menstrual period 11/05/2017, unknown if currently breastfeeding. General appearance: alert, cooperative and no distress Lungs: clear to auscultation bilaterally Heart: regular rate and rhythm Abdomen: soft, non-tender; bowel sounds normal Extremities: No calf swelling or tenderness Presentation: cephalic Fetal monitoring: 130/ moderate/ +accels/ variable deceleration x1  Uterine activity: 3-6  Dilation: 2.5 Effacement (%):  60 Station: 0, Plus 1 Exam by:: Enis Slipper, RN  Prenatal labs: ABO, Rh: --/--/O POS (01/06 0732) Antibody: NEG (01/06 0732) Rubella: 1.90 (07/16 1358) RPR: Non Reactive (10/09 1050)  HBsAg: Negative (07/16 1358)  HIV: Non Reactive (10/09 1050)  GC/Chlamydia: negative  GBS: Negative (12/20 1117)  2 hr Glucola: 76/161/98, normal  Genetic screening:  Low risk  Anatomy US: normal female  Nursing Staff Provider  Office Location  [facility] Dating  LMP consistent with 1st trimester [place]  Language  English Anatomy US   Normal - female   Flu Vaccine  05/27/18 Genetic Screen  NIPS: Low risk female  AFP: negative   TDaP vaccine   05/27/18 Hgb A1C or  GTT Early NA Third trimester 76/161/98  Rhogam  NA   LAB RESULTS   Feeding Plan Breast/Bottle Blood Type O/Positive/-- (07/16 1358) O+  Contraception Nexplanon Antibody Negative (07/16 1358)Negative  Circumcision Yes if boy Rubella 1.90 (07/16 1358)Immune  Pediatrician  [facility] RPR Non Reactive (07/16 1358)   Support Person sister HBsAg Negative (07/16 1358) NR  Prenatal Classes NA HIV Non Reactive (07/16 1358)  BTL Consent NA GBS  (negative  VBAC Consent NA Pap  Normal 2017    Hgb Electro   Normal 2017    CF  Negative    SMA  3    Waterbirth  [ ]  Class [ ]  Consent [ ]  CNM visit   Prenatal Transfer Tool  Maternal Diabetes: No Genetic Screening: Normal Maternal Ultrasounds/Referrals: Normal Fetal Ultrasounds or other Referrals:  None Maternal Substance Abuse:  No Significant Maternal Medications:  None Significant Maternal Lab Results: Lab values include: Group B Strep negative  Results for orders placed or performed during the hospital encounter of 08/24/18 (from the past 24 hour(s))  CBC   Collection Time: 08/24/18  7:32 AM  Result Value Ref Range   WBC 8.4 4.0 - 10.5 K/uL   RBC 3.63 (L) 3.87 - 5.11 MIL/uL   Hemoglobin 10.2 (L) 12.0 - 15.0 g/dL   HCT 01.6 (L) 55.3 - 74.8 %   MCV 87.3 80.0 - 100.0 fL   MCH 28.1 26.0 -  34.0 pg   MCHC 32.2 30.0 - 36.0 g/dL   RDW 27.0 (H) 78.6 - 75.4 %   Platelets 197 150 - 400 K/uL   nRBC 0.0 0.0 - 0.2 %  Type and screen   Collection Time: 08/24/18  7:32 AM  Result Value Ref Range   ABO/RH(D) O POS    Antibody Screen NEG    Sample Expiration  08/27/2018 Performed at [facility], [address] [place], [place] [address]     Patient Active Problem List   Diagnosis Date Noted  . Post-dates pregnancy 08/24/2018  . Grand multipara 03/31/2018  . Trichomonal vaginitis during pregnancy in second trimester 03/12/2018  . Supervision of other normal pregnancy, antepartum 01/21/2018    Assessment: Cindy Cruz is a 33 y.o. [id number] at [redacted]w[redacted]d here for IOL for PD   #Labor: IOL with pitocin, titrate to active labor  #Pain: Plans epidural, medication ordered PRN  #FWB: Cat I  #ID:  GBS neg #MOF: Breast and Formula  #MOC:Nexplanon  #Circ:  Yes, outpatient   Sharyon Cable, CNM 08/24/2018, 10:09 AM

## 2018-08-25 LAB — CBC
HCT: 29.7 % — ABNORMAL LOW (ref 36.0–46.0)
Hemoglobin: 9.6 g/dL — ABNORMAL LOW (ref 12.0–15.0)
MCH: 28.2 pg (ref 26.0–34.0)
MCHC: 32.3 g/dL (ref 30.0–36.0)
MCV: 87.4 fL (ref 80.0–100.0)
Platelets: 200 10*3/uL (ref 150–400)
RBC: 3.4 MIL/uL — ABNORMAL LOW (ref 3.87–5.11)
RDW: 17.1 % — ABNORMAL HIGH (ref 11.5–15.5)
WBC: 11.6 10*3/uL — ABNORMAL HIGH (ref 4.0–10.5)
nRBC: 0 % (ref 0.0–0.2)

## 2018-08-25 MED ORDER — IBUPROFEN 600 MG PO TABS: 600.0000 mg | ORAL_TABLET | Freq: Four times a day (QID) | ORAL | 0 refills | Status: DC

## 2018-08-25 NOTE — Progress Notes (Signed)
MOB was referred for history of depression. * Referral screened out by Clinical Social Worker because none of the following criteria appear to apply: ~ History of depression during this pregnancy, or of post-partum depression following prior delivery. Per chart review, no concerns of depression noted in OB records and no concerns of post partum depression with prior delivery indicated in notes. ~ Diagnosis of depression within last 3 years. Per chart review, MOB's hx of depression dates back to 2015 and it is noted "ok now" under depression diagnosis.   OR * MOB's symptoms currently being treated with medication and/or therapy.  Please contact the Clinical Social Worker if needs arise, by [facility] request, or if MOB scores greater than 9/yes to question 10 on Edinburgh Postpartum Depression Screen.  Celso Sickle, LCSWA Clinical Social Worker [facility] Cell#: [phone]

## 2018-08-26 NOTE — Discharge Summary (Signed)
Postpartum Discharge Summary     Patient Name: Cindy Cruz DOB: [birth year] MRN: [id number]  Date of admission: 08/24/2018 Delivering Provider: Dollene Cleveland   Date of discharge: 08/26/2018  Admitting diagnosis: 41wks, induction Intrauterine pregnancy: [redacted]w[redacted]d     Secondary diagnosis:  Active Problems:   Post-dates pregnancy   SVD (spontaneous vaginal delivery)  Additional problems: none     Discharge diagnosis: Term Pregnancy Delivered                                                                                                Post partum procedures:none  Augmentation: Pitocin  Complications: ROM>24 hours  Hospital course:  Induction of Labor With Vaginal Delivery   33 y.o. yo [id number] at [redacted]w[redacted]d was admitted to the hospital 08/24/2018 for induction of labor.  Indication for induction: Postdates.  Patient had an uncomplicated labor course as follows: Membrane Rupture Time/Date: 7:00 PM ,08/20/2018   Intrapartum Procedures: Episiotomy: None [1]                                         Lacerations:  None [1]  Patient had delivery of a Viable infant.  Information for the patient's newborn:  Caryn, Diloreto [[id number]]      08/24/2018  Details of delivery can be found in separate delivery note.  Patient had a routine postpartum course. Patient is discharged home 08/26/18.  Magnesium Sulfate recieved: No BMZ received: No  Physical exam  Vitals:   08/25/18 0106 08/25/18 0522 08/25/18 2319 08/26/18 0645  BP: 113/76 112/89 106/69 129/84  Pulse: 70 72 81 77  Resp: 18 18 18 18   Temp: (!) 97.5 F (36.4 C) 98.2 F (36.8 C) 98.4 F (36.9 C) 98.2 F (36.8 C)  TempSrc: Oral Oral Oral Oral  SpO2:      Weight:      Height:       General: alert, cooperative and no distress Lochia: appropriate Uterine Fundus: firm Incision: N/A DVT Evaluation: No evidence of DVT seen on physical exam. Labs: Lab Results  Component Value Date   WBC 11.6 (H) 08/25/2018   HGB 9.6 (L)  08/25/2018   HCT 29.7 (L) 08/25/2018   MCV 87.4 08/25/2018   PLT 200 08/25/2018   No flowsheet data found.  Discharge instruction: per After Visit Summary and "Baby and Me Booklet".  After visit meds:  Allergies as of 08/26/2018   No Known Allergies     Medication List    STOP taking these medications   clotrimazole 1 % vaginal cream Commonly known as:  GYNE-LOTRIMIN   omeprazole 20 MG tablet Commonly known as:  PRILOSEC OTC   PRENATE PIXIE 10-0.6-0.4-200 MG Caps   terconazole 0.8 % vaginal cream Commonly known as:  TERAZOL 3     TAKE these medications   ibuprofen 600 MG tablet Commonly known as:  ADVIL,MOTRIN Take 1 tablet (600 mg total) by mouth every 6 (six) hours.       Diet: routine diet  Activity: Advance as tolerated. Pelvic rest for 6 weeks.   Outpatient follow up:4 weeks Follow up Appt: Future Appointments  Date Time Provider Department Center  09/22/2018  1:00 PM Adam PhenixArnold, James G, MD [facility] None   Follow up Visit: Follow-up Information    [facility].   Specialty:  Obstetrics and Gynecology Why:  Follow up in 4-6 weeks for a postpartum visit Contact information: [address], [address] [place] [id number] [phone]           Please schedule this patient for Postpartum visit in: 4 weeks with the following provider: Any provider For C/S patients schedule nurse incision check in weeks 2 weeks: no Low risk pregnancy complicated by: nothing Delivery mode:  SVD Anticipated Birth Control:  Nexplanon PP Procedures needed: Nexplanon placement  Schedule Integrated BH visit: no      Newborn Data: Live born female  Birth Weight: 6 lb 3.8 oz (2830 g) APGAR: 8, 9  Newborn Delivery   Birth date/time:  08/24/2018 14:37:00 Delivery type:  Vaginal, Spontaneous     Baby Feeding: Bottle and Breast Disposition:home with mother   08/26/2018 Wynelle BourgeoisMarie Mitch Arquette, CNM

## 2018-08-26 NOTE — Lactation Note (Signed)
This note was copied from a baby's chart. Lactation Consultation Note  Patient Name: Cindy Cruz BWLSL'H Date: 08/26/2018 Reason for consult: Follow-up assessment   P6, Baby 41 hours old.  Mother has mostly been formula feeding. Reviewed engorgement care and monitoring voids/stools. Feed on demand approximately 8-12 times per day.   Provided mother with manual pump.    Maternal Data    Feeding Feeding Type: Formula Nipple Type: Slow - flow  LATCH Score                   Interventions Interventions: Breast feeding basics reviewed;DEBP  Lactation Tools Discussed/Used     Consult Status Consult Status: Complete Date: 08/26/18    Dahlia Byes [facility] 08/26/2018, 8:24 AM

## 2018-08-26 NOTE — Discharge Instructions (Signed)

## 2018-09-22 ENCOUNTER — Ambulatory Visit: Payer: Medicaid Other | Admitting: Obstetrics & Gynecology

## 2018-09-25 ENCOUNTER — Encounter: Payer: Self-pay | Admitting: Obstetrics & Gynecology

## 2019-03-23 IMAGING — US US OB TRANSVAGINAL
1 series · 15 of 28 positions shown · non-contrast
Comparison: None.

CLINICAL DATA: 31 y/o  F; pain.

EXAM:
OBSTETRIC <14 WK US AND TRANSVAGINAL OB US
TECHNIQUE: Both transabdominal and transvaginal ultrasound examinations were
performed for complete evaluation of the gestation as well as the
maternal uterus, adnexal regions, and pelvic cul-de-sac.
Transvaginal technique was performed to assess early pregnancy.

[Series 1: us ob transvaginal · 61 acquisitions, 15 frames shown]
[im 1/61]
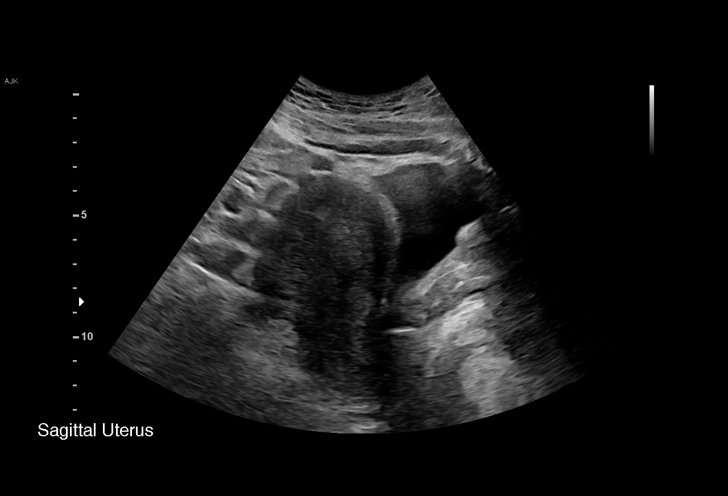
[im 5/61]
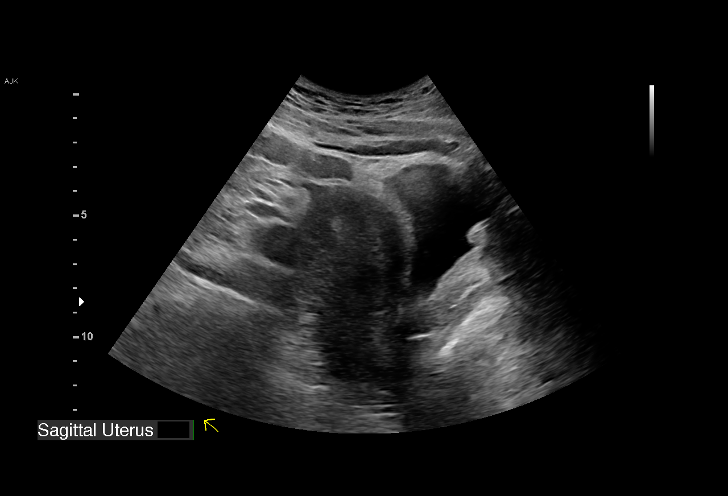
[im 9/61]
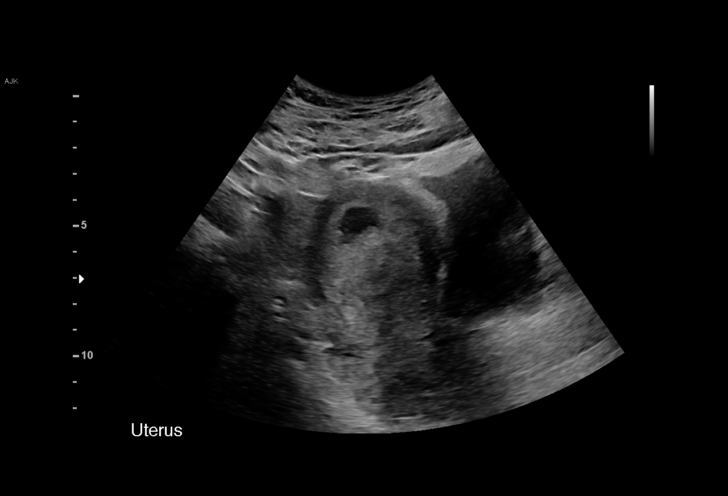
[im 14/61]
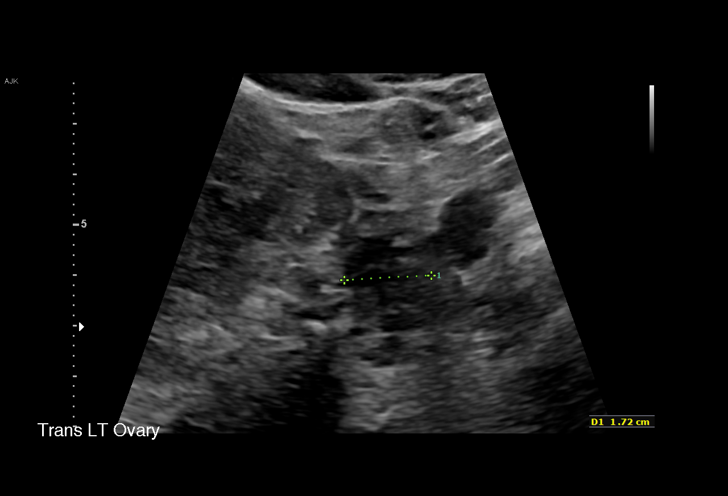
[im 18/61]
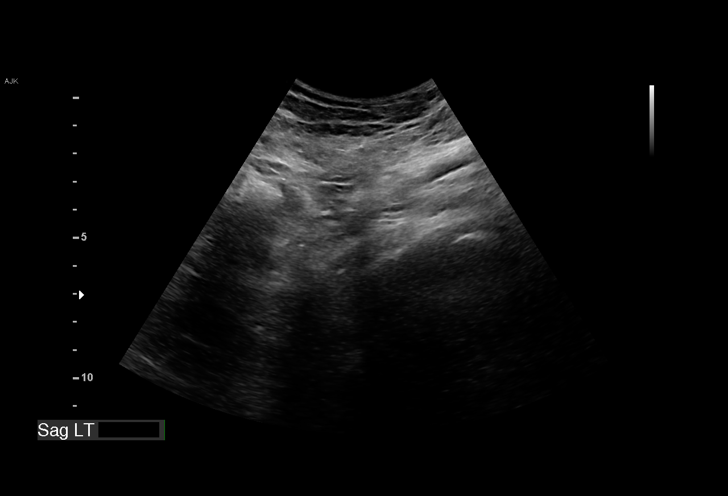
[im 23/61]
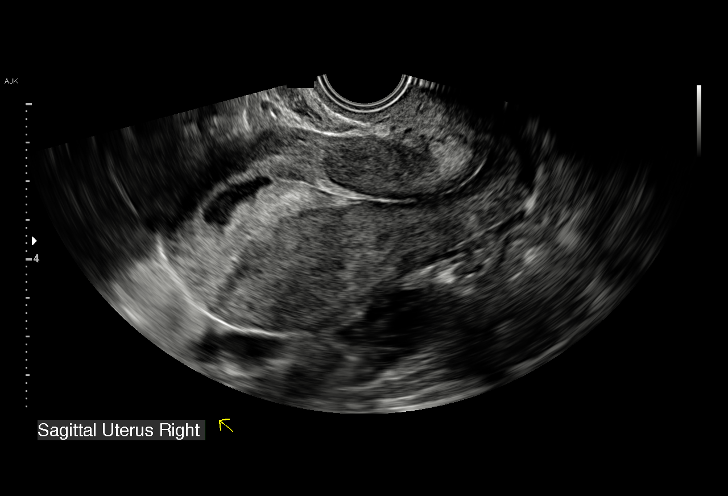
[im 27/61]
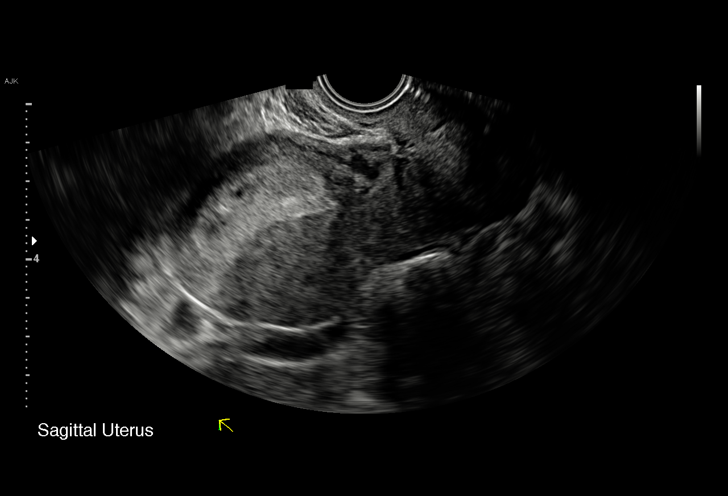
[im 32/61]
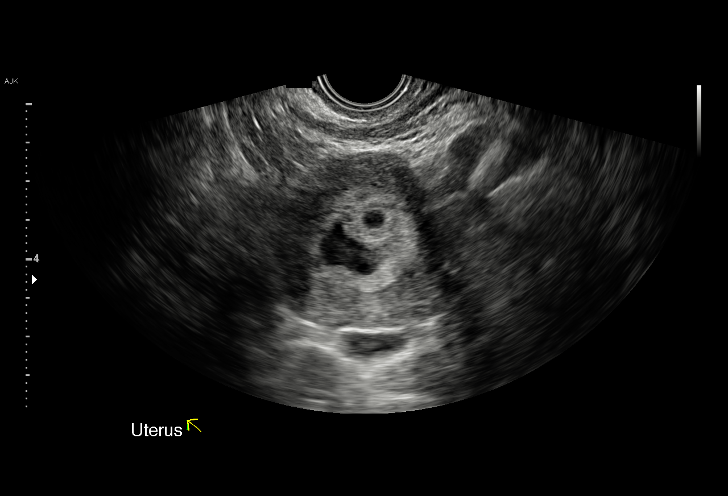
[im 34/61]
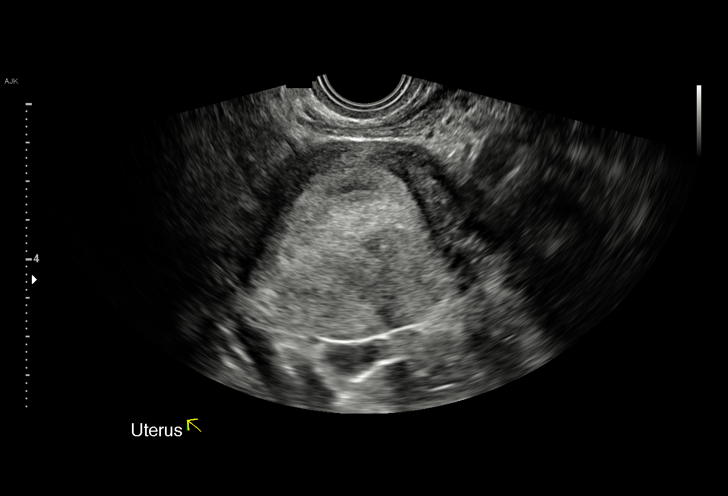
[im 38/61]
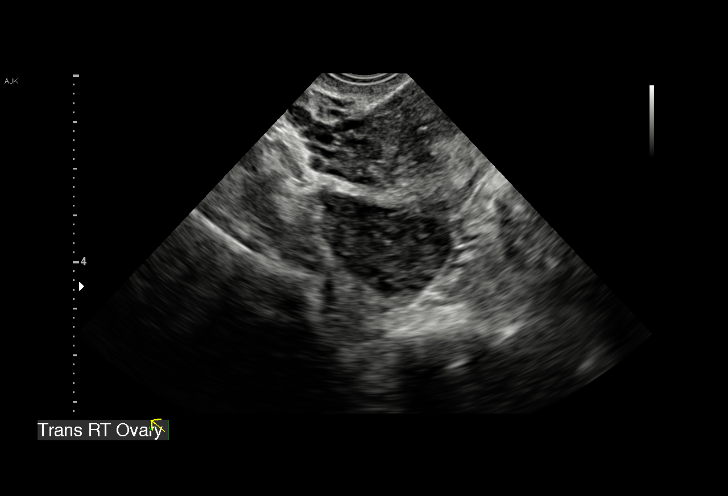
[im 43/61]
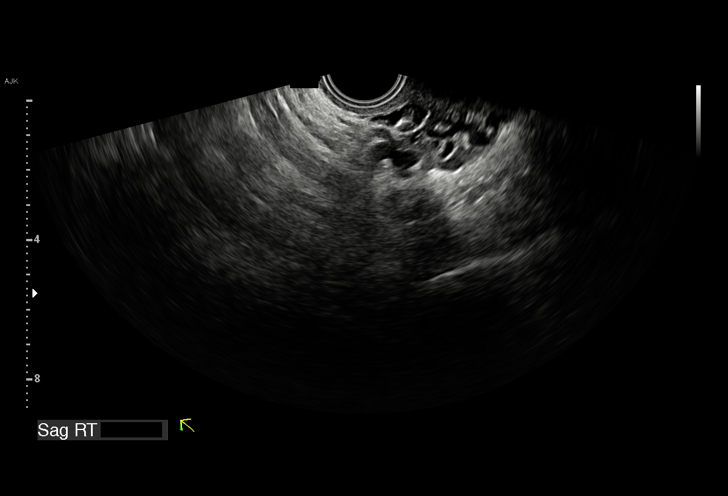
[im 47/61]
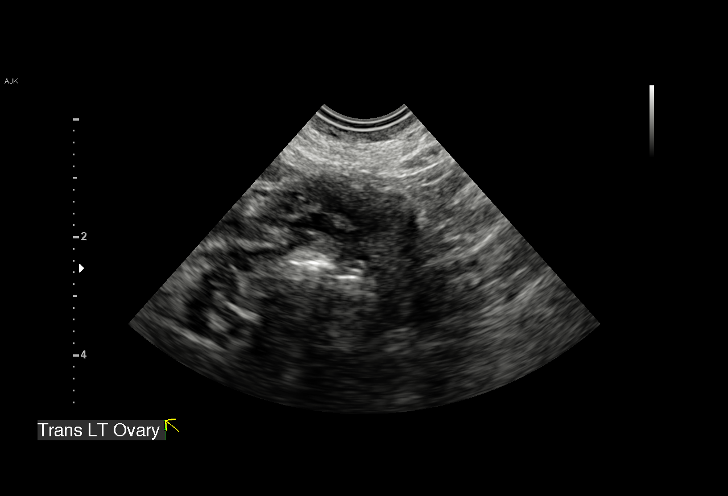
[im 52/61]
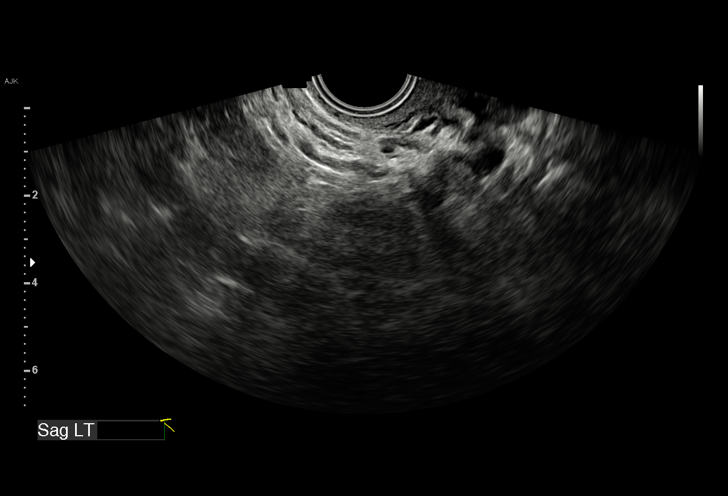
[im 56/61]
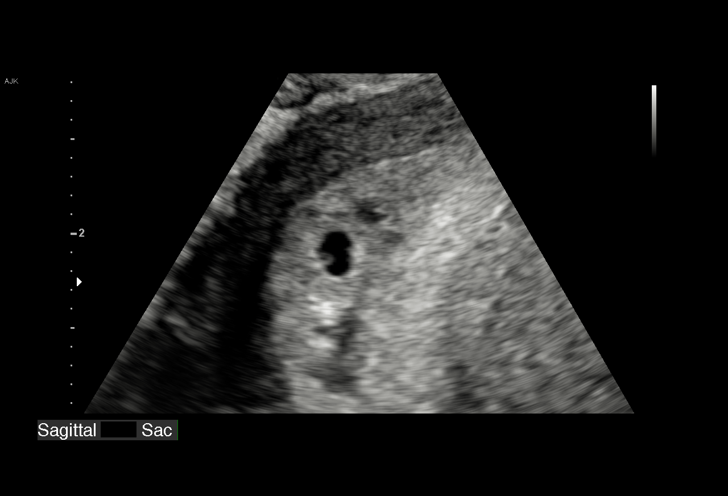
[im 61/61]
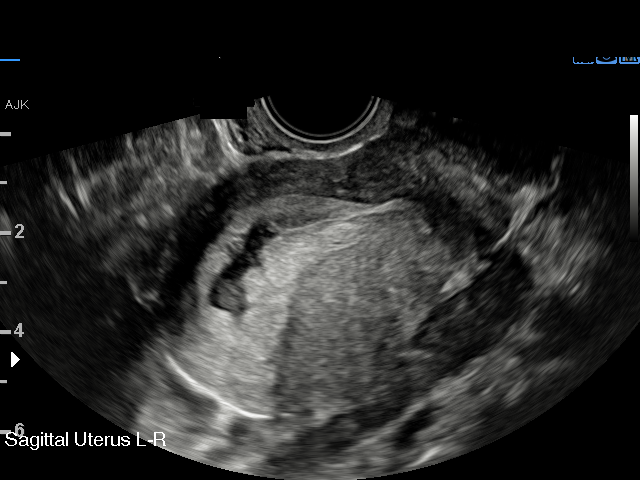

[15 of 28 positions shown; findings below may reference images not displayed]

FINDINGS: Intrauterine gestational sac: Single

Yolk sac:  Visualized.

Embryo:  Not Visualized.

Cardiac Activity: Not Visualized.

MSD: 4.7 mm mm   5 w   1 d

Subchorionic hemorrhage:  Large subchorionic hemorrhage.

Maternal uterus/adnexae: 4.4 x 1.8 x 2.7 cm right ovary with corpus
luteum. 2.4 x 1.6 x 1.4 cm left ovary.
IMPRESSION: Probable early intrauterine gestational sac and yolk sac, but no
fetal pole or cardiac activity yet visualized. Recommend follow-up
quantitative B-HCG levels and follow-up US in 14 days to assess
viability. This recommendation follows SRU consensus guidelines:
Diagnostic Criteria for Nonviable Pregnancy Early in the First
Trimester. N Engl J Med 2914; [DATE].

Large subchorionic hemorrhage.

By: Habob Abr M.D.

## 2019-08-19 ENCOUNTER — Ambulatory Visit (HOSPITAL_COMMUNITY)
Admission: EM | Admit: 2019-08-19 | Discharge: 2019-08-19 | Disposition: A | Discharge status: Home / Self Care | Payer: Medicaid Other | Attending: Family Medicine | Admitting: Family Medicine

## 2019-08-19 ENCOUNTER — Encounter (HOSPITAL_COMMUNITY): Payer: Self-pay

## 2019-08-19 ENCOUNTER — Other Ambulatory Visit: Payer: Self-pay

## 2019-08-19 DIAGNOSIS — T2692XA Corrosion of left eye and adnexa, part unspecified, initial encounter: Secondary | ICD-10-CM | POA: Diagnosis not present

## 2019-08-19 MED ORDER — TETRACAINE HCL 0.5 % OP SOLN
OPHTHALMIC | Status: AC
  Filled 2019-08-19: qty 4

## 2019-08-19 MED ORDER — HYDROCODONE-ACETAMINOPHEN 5-325 MG PO TABS
ORAL_TABLET | ORAL | Status: AC
  Filled 2019-08-19: qty 1

## 2019-08-19 MED ORDER — FLUORESCEIN SODIUM 1 MG OP STRP
ORAL_STRIP | OPHTHALMIC | Status: AC
  Filled 2019-08-19: qty 1

## 2019-08-19 MED ORDER — HYDROCODONE-ACETAMINOPHEN 5-325 MG PO TABS
1.0000 | ORAL_TABLET | Freq: Once | ORAL | Status: AC
  Administered 2019-08-19: 1 via ORAL

## 2019-08-19 MED ORDER — ERYTHROMYCIN 5 MG/GM OP OINT: TOPICAL_OINTMENT | Freq: Four times a day (QID) | OPHTHALMIC | Status: DC

## 2019-08-19 NOTE — Discharge Instructions (Signed)
Use eye ointment 3 x a day Take pain medicine if needed You  MUST see the eye doctor tomorrow morning at 0830 Call his office at 0800 to confirm the appointment Dr Presley Raddle [facility] [phone]

## 2019-08-19 NOTE — ED Triage Notes (Signed)
Pt states she has left eyelid swelling. Pt states she got some hand sanitizer in her eye yesterday. That happened around 12 am .

## 2019-08-19 NOTE — ED Provider Notes (Signed)
[place]    CSN: [id number] Arrival date & time: 08/19/19  1745      History   Chief Complaint Chief Complaint  Patient presents with  . Stye    HPI Cindy Cruz is a 33 y.o. female.   HPI  Patient pressed down forcibly on the top of her PUREL hand sanitizer bottle and it squirted hand sanitizer directly into her left eye.  She states it was immediately painful.  She rinsed it last night.  She rinsed it against this morning.  She got some eyedrops at the drugstore.  She has been using them today.  Her eye is swollen, uncomfortable, and tearing.  States that her vision is not affected  Past Medical History:  Diagnosis Date  . 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 Depression    ok now  . GERD (gastroesophageal reflux disease)    indigestion with pregnancy only; Rx med helps; pt cannot remember name of med  . Trichomoniasis     Patient Active Problem List   Diagnosis Date Noted  . Post-dates pregnancy 08/24/2018  . SVD (spontaneous vaginal delivery) 08/24/2018  . [place] multipara 03/31/2018  . Trichomonal vaginitis during pregnancy in second trimester 03/12/2018  . Supervision of other normal pregnancy, antepartum 01/21/2018    Past Surgical History:  Procedure Laterality Date  . WISDOM TOOTH EXTRACTION      OB History    Gravida  7   Para  6   Term  6   Preterm  0   AB  1   Living  6     SAB  0   TAB  0   Ectopic  0   Multiple  0   Live Births  6            Home Medications    Prior to Admission medications   Medication Sig Start Date End Date Taking? Authorizing Provider  ibuprofen (ADVIL,MOTRIN) 600 MG tablet Take 1 tablet (600 mg total) by mouth every 6 (six) hours. 08/25/18   Guadalupe Dawn, MD    Family History Family History  Problem Relation Age of Onset  . Hypertension Mother   . Cancer Maternal Aunt        breast  . Hearing loss Neg Hx     Social History Social History    Tobacco Use  . Smoking status: Current Every Day Smoker    Packs/day: 0.25    Years: 10.00    Pack years: 2.50    Types: Cigarettes  . Smokeless tobacco: Never Used  Substance Use Topics  . Alcohol use: Not Currently    Comment: socially on occ.-liquor  . Drug use: No     Allergies   Patient has no known allergies.   Review of Systems Review of Systems  Constitutional: Negative for chills and fever.  HENT: Negative for congestion and hearing loss.   Eyes: Positive for pain, discharge and redness.  Respiratory: Negative for cough and shortness of breath.   Cardiovascular: Negative for chest pain and leg swelling.  Gastrointestinal: Negative for abdominal pain, constipation and diarrhea.  Genitourinary: Negative for dysuria and frequency.  Musculoskeletal: Negative for myalgias.  Neurological: Negative for dizziness, seizures and headaches.  Psychiatric/Behavioral: The patient is not nervous/anxious.      Physical Exam Triage Vital Signs ED Triage Vitals  Enc Vitals Group     BP 08/19/19 1800 122/84     Pulse Rate 08/19/19 1800  92     Resp 08/19/19 1800 18     Temp 08/19/19 1800 98.7 F (37.1 C)     Temp src --      SpO2 08/19/19 1800 100 %     Weight --      Height --      Head Circumference --      Peak Flow --      Pain Score 08/19/19 1909 0     Pain Loc --      Pain Edu? --      Excl. in GC? --    No data found.  Updated Vital Signs BP 122/84 (BP Location: Right Arm)   Pulse 92   Temp 98.7 F (37.1 C)   Resp 18   LMP 08/10/2019   SpO2 100%      Physical Exam Constitutional:      General: She is not in acute distress.    Appearance: She is well-developed.     Comments: Acutely uncomfortable  HENT:     Head: Normocephalic and atraumatic.  Eyes:     General:        Left eye: Discharge present.    Extraocular Movements: Extraocular movements intact.     Conjunctiva/sclera:     Left eye: Left conjunctiva is injected. Chemosis present.      Pupils: Pupils are equal, round, and reactive to light. Pupils are equal.     Left eye: No corneal abrasion or fluorescein uptake.     Comments: Left eye has swollen lids.  They are erythematous.  There is remarkable chemosis of the conjunctive a with mild injection.  Fluorescein is negative  Cardiovascular:     Rate and Rhythm: Normal rate.  Pulmonary:     Effort: Pulmonary effort is normal. No respiratory distress.  Abdominal:     General: There is no distension.     Palpations: Abdomen is soft.  Musculoskeletal:        General: Normal range of motion.     Cervical back: Normal range of motion.  Skin:    General: Skin is warm and dry.  Neurological:     Mental Status: She is alert.      UC Treatments / Results  Labs (all labs ordered are listed, but only abnormal results are displayed) Labs Reviewed - No data to display  EKG   Radiology No results found.  Procedures Procedures (including critical care time)  Medications Ordered in UC Medications  erythromycin ophthalmic ointment ( Left Eye Given 08/19/19 1906)  HYDROcodone-acetaminophen (NORCO/VICODIN) 5-325 MG per tablet 1 tablet (1 tablet Oral Given 08/19/19 1905)    Initial Impression / Assessment and Plan / UC Course  I have reviewed the triage vital signs and the nursing notes.  Pertinent labs & imaging results that were available during my care of the patient were reviewed by me and considered in my medical decision making (see chart for details).     Spoke with Dr. Cathey Endow who is on-call for ophthalmology.  He recommends erythromycin ointment and a visit tomorrow Final Clinical Impressions(s) / UC Diagnoses   Final diagnoses:  Chemical insult, eye, left, initial encounter     Discharge Instructions     Use eye ointment 3 x a day Take pain medicine if needed You  MUST see the eye doctor tomorrow morning at 0830 Call his office at 0800 to confirm the appointment Dr Rodman Pickle  Ophthalmology [phone]    ED Prescriptions    None  PDMP not reviewed this encounter.   Eustace MooreNelson, [name], MD 08/19/19 [phone]

## 2019-11-01 ENCOUNTER — Ambulatory Visit (HOSPITAL_COMMUNITY)
Admission: EM | Admit: 2019-11-01 | Discharge: 2019-11-01 | Disposition: A | Discharge status: Home / Self Care | Payer: Medicaid Other

## 2019-11-01 ENCOUNTER — Encounter (HOSPITAL_COMMUNITY): Payer: Self-pay

## 2019-11-01 ENCOUNTER — Other Ambulatory Visit: Payer: Self-pay

## 2019-11-01 DIAGNOSIS — K2100 Gastro-esophageal reflux disease with esophagitis, without bleeding: Secondary | ICD-10-CM | POA: Diagnosis not present

## 2019-11-01 NOTE — ED Provider Notes (Signed)
[facility]    CSN: [id number] Arrival date & time: 11/01/19  0934      History   Chief Complaint Chief Complaint  Patient presents with  . Heartburn    HPI Cindy Cruz is a 34 y.o. female.   Reports that she has been having "really bad" heartburn for the last few weeks.  Reports that she is taking Tums multiple times a day.  Reports that every time she eats and swallows it burns/hurts.  Reports that she had terrible reflux when she was pregnant with her children, who are now school-age.  Denies taking any medications or attempts to treat this at home other than with Tums.  Denies headache, body aches, nausea, vomiting, diarrhea, fever, chills, sore throat, fever, rash, other symptoms.  ROS per HPI  The history is provided by the patient.    Past Medical History:  Diagnosis Date  . 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 Depression    ok now  . GERD (gastroesophageal reflux disease)    indigestion with pregnancy only; Rx med helps; pt cannot remember name of med  . Trichomoniasis     Patient Active Problem List   Diagnosis Date Noted  . Post-dates pregnancy 08/24/2018  . SVD (spontaneous vaginal delivery) 08/24/2018  . [place] multipara 03/31/2018  . Trichomonal vaginitis during pregnancy in second trimester 03/12/2018  . Supervision of other normal pregnancy, antepartum 01/21/2018    Past Surgical History:  Procedure Laterality Date  . WISDOM TOOTH EXTRACTION      OB History    Gravida  7   Para  6   Term  6   Preterm  0   AB  1   Living  6     SAB  0   TAB  0   Ectopic  0   Multiple  0   Live Births  6            Home Medications    Prior to Admission medications   Medication Sig Start Date End Date Taking? Authorizing Provider  calcium carbonate (TUMS - DOSED IN MG ELEMENTAL CALCIUM) 500 MG chewable tablet Chew 4 tablets by mouth daily.   Yes [provider]  ibuprofen (ADVIL,MOTRIN)  600 MG tablet Take 1 tablet (600 mg total) by mouth every 6 (six) hours. 08/25/18   Myrene Buddy, MD    Family History Family History  Problem Relation Age of Onset  . Hypertension Mother   . Cancer Maternal Aunt        breast  . Hearing loss Neg Hx     Social History Social History   Tobacco Use  . Smoking status: Current Every Day Smoker    Packs/day: 0.25    Years: 10.00    Pack years: 2.50    Types: Cigarettes  . Smokeless tobacco: Never Used  Substance Use Topics  . Alcohol use: Not Currently    Comment: socially on occ.-liquor  . Drug use: No     Allergies   Patient has no known allergies.   Review of Systems Review of Systems   Physical Exam Triage Vital Signs ED Triage Vitals  Enc Vitals Group     BP      Pulse      Resp      Temp      Temp src      SpO2      Weight      Height  Head Circumference      Peak Flow      Pain Score      Pain Loc      Pain Edu?      Excl. in GC?    No data found.  Updated Vital Signs BP 119/82 (BP Location: Left Arm)   Pulse 87   Temp 97.8 F (36.6 C) (Oral)   Resp 18   Wt 195 lb 6.4 oz (88.6 kg)   LMP 10/31/2019   SpO2 99%   Breastfeeding No   BMI 32.52 kg/m   Visual Acuity Right Eye Distance:   Left Eye Distance:   Bilateral Distance:    Right Eye Near:   Left Eye Near:    Bilateral Near:     Physical Exam Vitals and nursing note reviewed.  Constitutional:      General: She is not in acute distress.    Appearance: Normal appearance. She is well-developed.  HENT:     Head: Normocephalic and atraumatic.     Nose: Nose normal.     Mouth/Throat:     Mouth: Mucous membranes are moist.     Pharynx: No posterior oropharyngeal erythema.  Eyes:     Conjunctiva/sclera: Conjunctivae normal.  Cardiovascular:     Rate and Rhythm: Normal rate and regular rhythm.     Heart sounds: Normal heart sounds. No murmur.  Pulmonary:     Effort: Pulmonary effort is normal. No respiratory distress.      Breath sounds: Normal breath sounds.  Abdominal:     General: Bowel sounds are normal. There is no distension.     Palpations: Abdomen is soft. There is no mass.     Tenderness: There is no abdominal tenderness. There is no guarding or rebound.     Hernia: No hernia is present.  Musculoskeletal:        General: Normal range of motion.     Cervical back: Neck supple.  Skin:    General: Skin is warm and dry.     Capillary Refill: Capillary refill takes less than 2 seconds.  Neurological:     General: No focal deficit present.     Mental Status: She is alert and oriented to person, place, and time.  Psychiatric:        Mood and Affect: Mood normal.        Behavior: Behavior normal.      UC Treatments / Results  Labs (all labs ordered are listed, but only abnormal results are displayed) Labs Reviewed - No data to display  EKG   Radiology No results found.  Procedures Procedures (including critical care time)  Medications Ordered in UC Medications - No data to display  Initial Impression / Assessment and Plan / UC Course  I have reviewed the triage vital signs and the nursing notes.  Pertinent labs & imaging results that were available during my care of the patient were reviewed by me and considered in my medical decision making (see chart for details).     GERD with esophagitis.  Stopped taking as many times.  May take over-the-counter Pepcid or Prilosec.  Take 1 tablet daily.  If not improving over the next 2 weeks, follow-up with our office, or with GI with information has been provided.  Establish primary care provider.  Instructed to go to the ER for trouble breathing, trouble swallowing, shortness of breath, other concerning symptoms. Final Clinical Impressions(s) / UC Diagnoses   Final diagnoses:  Gastroesophageal reflux disease with  esophagitis, unspecified whether hemorrhage     Discharge Instructions     Get store brand famotidine (Pepcid) and take one  tablet daily.   You are experiencing Acid Reflux.   Follow up with our office as needed. I have included GI information.  [place] [facility] is taking new patients right now. [phone]     ED Prescriptions    None     PDMP not reviewed this encounter.   Faustino Congress, NP 11/01/19 1138

## 2019-11-01 NOTE — Discharge Instructions (Addendum)
Get store brand famotidine (Pepcid) and take one tablet daily.   You are experiencing Acid Reflux.   Follow up with our office as needed. I have included GI information.  [place] [facility] is taking new patients right now. [phone]

## 2019-11-01 NOTE — ED Triage Notes (Signed)
Pt is here with heartburn that started a week ago, pt has taken tums to relieve discomfort.

## 2020-02-11 ENCOUNTER — Inpatient Hospital Stay (HOSPITAL_COMMUNITY): Payer: Medicaid Other

## 2020-02-11 ENCOUNTER — Inpatient Hospital Stay (HOSPITAL_COMMUNITY)
Admission: AD | Admit: 2020-02-11 | Discharge: 2020-02-11 | Disposition: A | Discharge status: Home / Self Care | Payer: Medicaid Other | Attending: Obstetrics and Gynecology | Admitting: Obstetrics and Gynecology

## 2020-02-11 ENCOUNTER — Encounter (HOSPITAL_COMMUNITY): Payer: Self-pay | Admitting: Obstetrics and Gynecology

## 2020-02-11 ENCOUNTER — Other Ambulatory Visit: Payer: Self-pay

## 2020-02-11 DIAGNOSIS — O99611 Diseases of the digestive system complicating pregnancy, first trimester: Secondary | ICD-10-CM | POA: Diagnosis not present

## 2020-02-11 DIAGNOSIS — O418X10 Other specified disorders of amniotic fluid and membranes, first trimester, not applicable or unspecified: Secondary | ICD-10-CM | POA: Diagnosis not present

## 2020-02-11 DIAGNOSIS — Z3A01 Less than 8 weeks gestation of pregnancy: Secondary | ICD-10-CM | POA: Diagnosis not present

## 2020-02-11 DIAGNOSIS — R109 Unspecified abdominal pain: Secondary | ICD-10-CM | POA: Diagnosis not present

## 2020-02-11 DIAGNOSIS — O0941 Supervision of pregnancy with grand multiparity, first trimester: Secondary | ICD-10-CM | POA: Insufficient documentation

## 2020-02-11 DIAGNOSIS — O26891 Other specified pregnancy related conditions, first trimester: Secondary | ICD-10-CM | POA: Diagnosis not present

## 2020-02-11 DIAGNOSIS — K219 Gastro-esophageal reflux disease without esophagitis: Secondary | ICD-10-CM | POA: Insufficient documentation

## 2020-02-11 DIAGNOSIS — O208 Other hemorrhage in early pregnancy: Secondary | ICD-10-CM | POA: Diagnosis not present

## 2020-02-11 DIAGNOSIS — O26899 Other specified pregnancy related conditions, unspecified trimester: Secondary | ICD-10-CM

## 2020-02-11 DIAGNOSIS — F17210 Nicotine dependence, cigarettes, uncomplicated: Secondary | ICD-10-CM | POA: Insufficient documentation

## 2020-02-11 DIAGNOSIS — O99331 Smoking (tobacco) complicating pregnancy, first trimester: Secondary | ICD-10-CM | POA: Insufficient documentation

## 2020-02-11 DIAGNOSIS — O468X1 Other antepartum hemorrhage, first trimester: Secondary | ICD-10-CM

## 2020-02-11 LAB — CBC WITH DIFFERENTIAL/PLATELET
Abs Immature Granulocytes: 0.02 10*3/uL (ref 0.00–0.07)
Basophils Absolute: 0 10*3/uL (ref 0.0–0.1)
Basophils Relative: 1 %
Eosinophils Absolute: 0.1 10*3/uL (ref 0.0–0.5)
Eosinophils Relative: 1 %
HCT: 38.5 % (ref 36.0–46.0)
Hemoglobin: 12.4 g/dL (ref 12.0–15.0)
Immature Granulocytes: 0 %
Lymphocytes Relative: 28 %
Lymphs Abs: 2.4 10*3/uL (ref 0.7–4.0)
MCH: 27.6 pg (ref 26.0–34.0)
MCHC: 32.2 g/dL (ref 30.0–36.0)
MCV: 85.7 fL (ref 80.0–100.0)
Monocytes Absolute: 0.6 10*3/uL (ref 0.1–1.0)
Monocytes Relative: 7 %
Neutro Abs: 5.6 10*3/uL (ref 1.7–7.7)
Neutrophils Relative %: 63 %
Platelets: 399 10*3/uL (ref 150–400)
RBC: 4.49 MIL/uL (ref 3.87–5.11)
RDW: 16.3 % — ABNORMAL HIGH (ref 11.5–15.5)
WBC: 8.9 10*3/uL (ref 4.0–10.5)
nRBC: 0 % (ref 0.0–0.2)

## 2020-02-11 LAB — COMPREHENSIVE METABOLIC PANEL
ALT: 32 U/L (ref 0–44)
AST: 23 U/L (ref 15–41)
Albumin: 3.4 g/dL — ABNORMAL LOW (ref 3.5–5.0)
Alkaline Phosphatase: 59 U/L (ref 38–126)
Anion gap: 9 (ref 5–15)
BUN: 9 mg/dL (ref 6–20)
CO2: 23 mmol/L (ref 22–32)
Calcium: 9.2 mg/dL (ref 8.9–10.3)
Chloride: 104 mmol/L (ref 98–111)
Creatinine, Ser: 0.97 mg/dL (ref 0.44–1.00)
GFR calc Af Amer: >60 mL/min (ref >60)
GFR calc non Af Amer: >60 mL/min (ref >60)
Glucose, Bld: 98 mg/dL (ref 70–99)
Potassium: 4.2 mmol/L (ref 3.5–5.1)
Sodium: 136 mmol/L (ref 135–145)
Total Bilirubin: 0.6 mg/dL (ref 0.3–1.2)
Total Protein: 7.1 g/dL (ref 6.5–8.1)

## 2020-02-11 LAB — URINALYSIS, ROUTINE W REFLEX MICROSCOPIC
Bilirubin Urine: NEGATIVE
Glucose, UA: NEGATIVE mg/dL
Hgb urine dipstick: NEGATIVE
Ketones, ur: NEGATIVE mg/dL
Leukocytes,Ua: NEGATIVE
Nitrite: NEGATIVE
Protein, ur: NEGATIVE mg/dL
Specific Gravity, Urine: 1.026 (ref 1.005–1.030)
pH: 5 (ref 5.0–8.0)

## 2020-02-11 LAB — POCT PREGNANCY, URINE: Preg Test, Ur: POSITIVE — AB

## 2020-02-11 LAB — ABO/RH: ABO/RH(D): O POS

## 2020-02-11 LAB — WET PREP, GENITAL
Clue Cells Wet Prep HPF POC: NONE SEEN
Sperm: NONE SEEN
Trich, Wet Prep: NONE SEEN
Yeast Wet Prep HPF POC: NONE SEEN

## 2020-02-11 LAB — HCG, QUANTITATIVE, PREGNANCY: hCG, Beta Chain, Quant, S: 7754 m[IU]/mL — ABNORMAL HIGH (ref <5)

## 2020-02-11 NOTE — MAU Note (Signed)
Patient came into MAU with c/o of left lower abdominal pain that started 2 days ago, LMP 12/30/19. Patient denies vaginal bleeding, but reports "strong odor" in the perineum.

## 2020-02-11 NOTE — MAU Provider Note (Signed)
[place]     CSN: [id number]  Arrival date and time: 02/11/20 1547   None     Chief Complaint  Patient presents with  . Abdominal Pain   HPI  [name] is a 34 y.o. female [phone] @ [redacted]w[redacted]d here with abdominal pain. The pain is located in the center of her lower abdomen that started a few days ago. The pain comes and goes. She has not taken pain medication. She rates her pain 0/10 at this time. The pain comes and goes throughout the day. No bleeding.   OB History    Gravida  8   Para  6   Term  6   Preterm  0   AB  1   Living  6     SAB  0   TAB  0   Ectopic  0   Multiple  0   Live Births  6           Past Medical History:  Diagnosis Date  . 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 Depression    ok now  . GERD (gastroesophageal reflux disease)    indigestion with pregnancy only; Rx med helps; pt cannot remember name of med  . Trichomoniasis     Past Surgical History:  Procedure Laterality Date  . WISDOM TOOTH EXTRACTION      Family History  Problem Relation Age of Onset  . Hypertension Mother   . Cancer Maternal Aunt        breast  . Hearing loss Neg Hx     Social History   Tobacco Use  . Smoking status: Current Every Day Smoker    Packs/day: 0.25    Years: 10.00    Pack years: 2.50    Types: Cigarettes  . Smokeless tobacco: Never Used  Vaping Use  . Vaping Use: Never used  Substance Use Topics  . Alcohol use: Not Currently    Comment: socially on occ.-liquor  . Drug use: No    Allergies: No Known Allergies  Medications Prior to Admission  Medication Sig Dispense Refill Last Dose  . calcium carbonate (TUMS - DOSED IN MG ELEMENTAL CALCIUM) 500 MG chewable tablet Chew 4 tablets by mouth daily.     Marland Kitchen ibuprofen (ADVIL,MOTRIN) 600 MG tablet Take 1 tablet (600 mg total) by mouth every 6 (six) hours. 30 tablet 0    Results for orders placed or performed during the hospital encounter of 02/11/20 (from the  past 48 hour(s))  Pregnancy, urine POC     Status: Abnormal   Collection Time: 02/11/20  4:23 PM  Result Value Ref Range   Preg Test, Ur POSITIVE (A) NEGATIVE    Comment:        THE SENSITIVITY OF THIS METHODOLOGY IS >24 mIU/mL   Urinalysis, Routine w reflex microscopic     Status: Abnormal   Collection Time: 02/11/20  4:40 PM  Result Value Ref Range   Color, Urine YELLOW YELLOW   APPearance HAZY (A) CLEAR   Specific Gravity, Urine 1.026 1.005 - 1.030   pH 5.0 5.0 - 8.0   Glucose, UA NEGATIVE NEGATIVE mg/dL   Hgb urine dipstick NEGATIVE NEGATIVE   Bilirubin Urine NEGATIVE NEGATIVE   Ketones, ur NEGATIVE NEGATIVE mg/dL   Protein, ur NEGATIVE NEGATIVE mg/dL   Nitrite NEGATIVE NEGATIVE   Leukocytes,Ua NEGATIVE NEGATIVE    Comment: Performed at [facility], [address], [place], [place] [address]  CBC with Differential/Platelet     Status: Abnormal   Collection Time: 02/11/20  5:01 PM  Result Value Ref Range   WBC 8.9 4.0 - 10.5 K/uL   RBC 4.49 3.87 - 5.11 MIL/uL   Hemoglobin 12.4 12.0 - 15.0 g/dL   HCT 38.8 36 - 46 %   MCV 85.7 80.0 - 100.0 fL   MCH 27.6 26.0 - 34.0 pg   MCHC 32.2 30.0 - 36.0 g/dL   RDW 82.8 (H) 00.3 - 49.1 %   Platelets 399 150 - 400 K/uL   nRBC 0.0 0.0 - 0.2 %   Neutrophils Relative % 63 %   Neutro Abs 5.6 1.7 - 7.7 K/uL   Lymphocytes Relative 28 %   Lymphs Abs 2.4 0.7 - 4.0 K/uL   Monocytes Relative 7 %   Monocytes Absolute 0.6 0 - 1 K/uL   Eosinophils Relative 1 %   Eosinophils Absolute 0.1 0 - 0 K/uL   Basophils Relative 1 %   Basophils Absolute 0.0 0 - 0 K/uL   Immature Granulocytes 0 %   Abs Immature Granulocytes 0.02 0.00 - 0.07 K/uL    Comment: Performed at [facility], [address], [place], [place] [address]  Comprehensive metabolic panel     Status: Abnormal   Collection Time: 02/11/20  5:01 PM  Result Value Ref Range   Sodium 136 135 - 145 mmol/L   Potassium 4.2 3.5 - 5.1 mmol/L   Chloride 104 98 - 111 mmol/L   CO2  23 22 - 32 mmol/L   Glucose, Bld 98 70 - 99 mg/dL    Comment: Glucose reference range applies only to samples taken after fasting for at least 8 hours.   BUN 9 6 - 20 mg/dL   Creatinine, Ser 5.69 0.44 - 1.00 mg/dL   Calcium 9.2 8.9 - 79.4 mg/dL   Total Protein 7.1 6.5 - 8.1 g/dL   Albumin 3.4 (L) 3.5 - 5.0 g/dL   AST 23 15 - 41 U/L   ALT 32 0 - 44 U/L   Alkaline Phosphatase 59 38 - 126 U/L   Total Bilirubin 0.6 0.3 - 1.2 mg/dL   GFR calc non Af Amer >60 >60 mL/min   GFR calc Af Amer >60 >60 mL/min   Anion gap 9 5 - 15    Comment: Performed at [facility], [address], [place], [place] [address]  ABO/Rh     Status: None   Collection Time: 02/11/20  5:01 PM  Result Value Ref Range   ABO/RH(D)      O POS Performed at [facility], [address] [place], [place] [address]   Wet prep, genital     Status: Abnormal   Collection Time: 02/11/20  5:14 PM   Specimen: Vaginal  Result Value Ref Range   Yeast Wet Prep HPF POC NONE SEEN NONE SEEN   Trich, Wet Prep NONE SEEN NONE SEEN   Clue Cells Wet Prep HPF POC NONE SEEN NONE SEEN   WBC, Wet Prep HPF POC FEW (A) NONE SEEN   Sperm NONE SEEN     Comment: Performed at [facility], [address] [place], [place] [address]   US OB LESS THAN 14 WEEKS WITH OB TRANSVAGINAL  Result Date: 02/11/2020 CLINICAL DATA:  Abdominal pain in first trimester of pregnancy, LEFT lower quadrant pain for 2 days, LMP 12/30/2019 EXAM: OBSTETRIC <14 WK [place] AND TRANSVAGINAL OB US TECHNIQUE: Both  transabdominal and transvaginal ultrasound examinations were performed for complete evaluation of the gestation as well as the maternal uterus, adnexal regions, and pelvic cul-de-sac. Transvaginal technique was performed to assess early pregnancy. COMPARISON:  None for this gestation FINDINGS: Intrauterine gestational sac: Present, single Yolk sac:  Present Embryo:  Not identified Cardiac Activity: N/A Heart Rate: N/A  bpm MSD: 7.2 mm   5 w   3 d  Subchorionic hemorrhage:  Large subchronic hemorrhage Maternal uterus/adnexae: RIGHT ovary measures 3.3 x 1.7 x 2.3 cm and contains a small corpus luteum. LEFT ovary normal size and morphology 2.5 x 3.6 x 1.3 cm. No free pelvic fluid or adnexal masses. IMPRESSION: Intrauterine gestational sac containing a yolk sac but no fetal pole is identified to establish viability; may consider follow-up ultrasound in 14 days to establish viability if clinically indicated. Large subchronic hemorrhage. Electronically Signed   By: Lavonia Dana M.D.   On: 02/11/2020 17:59   Review of Systems  Constitutional: Negative for fever.  Gastrointestinal: Positive for abdominal pain. Negative for nausea and vomiting.  Genitourinary: Positive for decreased urine volume. Negative for vaginal bleeding.   Physical Exam   Blood pressure 135/80, temperature 98.8 F (37.1 C), temperature source Oral, resp. rate 18, height 5\' 5"  (1.651 m), weight 89.9 kg, last menstrual period 12/30/2019, not currently breastfeeding.  Physical Exam Constitutional:      Appearance: She is well-developed.  HENT:     Head: Normocephalic.  Abdominal:     Tenderness: There is generalized abdominal tenderness. There is no guarding or rebound.  Genitourinary:    Comments: Cervix: closed, thick, posterior. No CMT Skin:    General: Skin is warm.  Neurological:     Mental Status: She is alert.    MAU Course  Procedures  None  MDM  Wet prep & GC HIV, CBC, Hcg, ABO US OB transvaginal  O positive blood type  Discussed [place] in detail with patient.   Assessment and Plan   A:  SIUP  1. Subchorionic hematoma in first trimester, single or unspecified fetus   2. Abdominal pain in pregnancy     P:  Discharge home in stable condition Return to MAU if symptoms worsen Start prenatal vitamins Start prenatal care   Navy Rothschild, Artist Pais, NP 02/11/2020 7:27 PM

## 2020-02-11 NOTE — Discharge Instructions (Signed)

## 2020-02-14 LAB — GC/CHLAMYDIA PROBE AMP (~~LOC~~) NOT AT ARMC
Chlamydia: NEGATIVE
Comment: NEGATIVE
Comment: NORMAL
Neisseria Gonorrhea: NEGATIVE

## 2020-03-06 ENCOUNTER — Inpatient Hospital Stay (HOSPITAL_COMMUNITY): Payer: Medicaid Other

## 2020-03-06 ENCOUNTER — Other Ambulatory Visit: Payer: Self-pay

## 2020-03-06 ENCOUNTER — Encounter (HOSPITAL_COMMUNITY): Payer: Self-pay | Admitting: Obstetrics and Gynecology

## 2020-03-06 ENCOUNTER — Inpatient Hospital Stay (HOSPITAL_COMMUNITY)
Admission: AD | Admit: 2020-03-06 | Discharge: 2020-03-06 | Disposition: A | Discharge status: Home / Self Care | Payer: Medicaid Other | Attending: Obstetrics and Gynecology | Admitting: Obstetrics and Gynecology

## 2020-03-06 DIAGNOSIS — O99331 Smoking (tobacco) complicating pregnancy, first trimester: Secondary | ICD-10-CM | POA: Insufficient documentation

## 2020-03-06 DIAGNOSIS — F17210 Nicotine dependence, cigarettes, uncomplicated: Secondary | ICD-10-CM | POA: Insufficient documentation

## 2020-03-06 DIAGNOSIS — O99891 Other specified diseases and conditions complicating pregnancy: Secondary | ICD-10-CM

## 2020-03-06 DIAGNOSIS — O98811 Other maternal infectious and parasitic diseases complicating pregnancy, first trimester: Secondary | ICD-10-CM | POA: Insufficient documentation

## 2020-03-06 DIAGNOSIS — Z6790 Unspecified blood type, Rh positive: Secondary | ICD-10-CM | POA: Insufficient documentation

## 2020-03-06 DIAGNOSIS — Z79899 Other long term (current) drug therapy: Secondary | ICD-10-CM | POA: Diagnosis not present

## 2020-03-06 DIAGNOSIS — O26891 Other specified pregnancy related conditions, first trimester: Secondary | ICD-10-CM | POA: Insufficient documentation

## 2020-03-06 DIAGNOSIS — B3731 Acute candidiasis of vulva and vagina: Secondary | ICD-10-CM

## 2020-03-06 DIAGNOSIS — Z3A09 9 weeks gestation of pregnancy: Secondary | ICD-10-CM | POA: Insufficient documentation

## 2020-03-06 DIAGNOSIS — R109 Unspecified abdominal pain: Secondary | ICD-10-CM | POA: Insufficient documentation

## 2020-03-06 DIAGNOSIS — O0941 Supervision of pregnancy with grand multiparity, first trimester: Secondary | ICD-10-CM | POA: Diagnosis not present

## 2020-03-06 DIAGNOSIS — B373 Candidiasis of vulva and vagina: Secondary | ICD-10-CM | POA: Insufficient documentation

## 2020-03-06 DIAGNOSIS — R12 Heartburn: Secondary | ICD-10-CM | POA: Diagnosis not present

## 2020-03-06 DIAGNOSIS — O3680X0 Pregnancy with inconclusive fetal viability, not applicable or unspecified: Secondary | ICD-10-CM

## 2020-03-06 DIAGNOSIS — Z3490 Encounter for supervision of normal pregnancy, unspecified, unspecified trimester: Secondary | ICD-10-CM

## 2020-03-06 LAB — COMPREHENSIVE METABOLIC PANEL
ALT: 18 U/L (ref 0–44)
AST: 18 U/L (ref 15–41)
Albumin: 3.1 g/dL — ABNORMAL LOW (ref 3.5–5.0)
Alkaline Phosphatase: 53 U/L (ref 38–126)
Anion gap: 5 (ref 5–15)
BUN: 6 mg/dL (ref 6–20)
CO2: 25 mmol/L (ref 22–32)
Calcium: 9 mg/dL (ref 8.9–10.3)
Chloride: 106 mmol/L (ref 98–111)
Creatinine, Ser: 0.71 mg/dL (ref 0.44–1.00)
GFR calc Af Amer: >60 mL/min (ref >60)
GFR calc non Af Amer: >60 mL/min (ref >60)
Glucose, Bld: 83 mg/dL (ref 70–99)
Potassium: 3.8 mmol/L (ref 3.5–5.1)
Sodium: 136 mmol/L (ref 135–145)
Total Bilirubin: 0.5 mg/dL (ref 0.3–1.2)
Total Protein: 6.3 g/dL — ABNORMAL LOW (ref 6.5–8.1)

## 2020-03-06 LAB — WET PREP, GENITAL
Sperm: NONE SEEN
Trich, Wet Prep: NONE SEEN
Yeast Wet Prep HPF POC: NONE SEEN

## 2020-03-06 LAB — URINALYSIS, ROUTINE W REFLEX MICROSCOPIC
Bacteria, UA: NONE SEEN
Bilirubin Urine: NEGATIVE
Glucose, UA: NEGATIVE mg/dL
Hgb urine dipstick: NEGATIVE
Ketones, ur: NEGATIVE mg/dL
Leukocytes,Ua: NEGATIVE
Nitrite: NEGATIVE
Protein, ur: NEGATIVE mg/dL
Specific Gravity, Urine: 1.026 (ref 1.005–1.030)
pH: 6 (ref 5.0–8.0)

## 2020-03-06 LAB — CBC
HCT: 35.4 % — ABNORMAL LOW (ref 36.0–46.0)
Hemoglobin: 11.2 g/dL — ABNORMAL LOW (ref 12.0–15.0)
MCH: 27.5 pg (ref 26.0–34.0)
MCHC: 31.6 g/dL (ref 30.0–36.0)
MCV: 86.8 fL (ref 80.0–100.0)
Platelets: 359 10*3/uL (ref 150–400)
RBC: 4.08 MIL/uL (ref 3.87–5.11)
RDW: 15.9 % — ABNORMAL HIGH (ref 11.5–15.5)
WBC: 7.9 10*3/uL (ref 4.0–10.5)
nRBC: 0 % (ref 0.0–0.2)

## 2020-03-06 LAB — HCG, QUANTITATIVE, PREGNANCY: hCG, Beta Chain, Quant, S: 225016 m[IU]/mL — ABNORMAL HIGH (ref <5)

## 2020-03-06 MED ORDER — TERCONAZOLE 0.4 % VA CREA: 1.0000 | TOPICAL_CREAM | Freq: Every day | VAGINAL | 0 refills | Status: AC

## 2020-03-06 NOTE — Discharge Instructions (Signed)
Safe Medications in Pregnancy    Acne: Benzoyl Peroxide Salicylic Acid  Backache/Headache: Tylenol: 2 regular strength every 4 hours OR              2 Extra strength every 6 hours  Colds/Coughs/Allergies: Benadryl (alcohol free) 25 mg every 6 hours as needed Breath right strips Claritin Cepacol throat lozenges Chloraseptic throat spray Cold-Eeze- up to three times per day Cough drops, alcohol free Flonase (by prescription only) Guaifenesin Mucinex Robitussin DM (plain only, alcohol free) Saline nasal spray/drops Sudafed (pseudoephedrine) & Actifed ** use only after [redacted] weeks gestation and if you do not have high blood pressure Tylenol Vicks Vaporub Zinc lozenges Zyrtec   Constipation: Colace Ducolax suppositories Fleet enema Glycerin suppositories Metamucil Milk of magnesia Miralax Senokot Smooth move tea  Diarrhea: Kaopectate Imodium A-D  *NO pepto Bismol  Hemorrhoids: Anusol Anusol HC Preparation H Tucks  Indigestion: Tums Maalox Mylanta Zantac  Pepcid  Insomnia: Benadryl (alcohol free) 25mg  every 6 hours as needed Tylenol PM Unisom, no Gelcaps  Leg Cramps: Tums MagGel  Nausea/Vomiting:  Bonine Dramamine Emetrol Ginger extract Sea bands Meclizine  Nausea medication to take during pregnancy:  Unisom (doxylamine succinate 25 mg tablets) Take one tablet daily at bedtime. If symptoms are not adequately controlled, the dose can be increased to a maximum recommended dose of two tablets daily (1/2 tablet in the morning, 1/2 tablet mid-afternoon and one at bedtime). Vitamin B6 100mg  tablets. Take one tablet twice a day (up to 200 mg per day).  Skin Rashes: Aveeno products Benadryl cream or 25mg  every 6 hours as needed Calamine Lotion 1% cortisone cream  Yeast infection: Gyne-lotrimin 7 Monistat 7   **If taking multiple medications, please check labels to avoid duplicating the same active ingredients **take  medication as directed on the label ** Do not exceed 4000 mg of tylenol in 24 hours **Do not take medications that contain aspirin or ibuprofen            Prenatal Care Prenatal care is health care during pregnancy. It helps you and your unborn baby (fetus) stay as healthy as possible. Prenatal care may be provided by a midwife, a family practice health care provider, or a childbirth and pregnancy specialist (obstetrician). How does this affect me? During pregnancy, you will be closely monitored for any new conditions that might develop. To lower your risk of pregnancy complications, you and your health care provider will talk about any underlying conditions you have. How does this affect my baby? Early and consistent prenatal care increases the chance that your baby will be healthy during pregnancy. Prenatal care lowers the risk that your baby will be:  Born early (prematurely).  Smaller than expected at birth (small for gestational age). What can I expect at the first prenatal care visit? Your first prenatal care visit will likely be the longest. You should schedule your first prenatal care visit as soon as you know that you are pregnant. Your first visit is a good time to talk about any questions or concerns you have about pregnancy. At your visit, you and your health care provider will talk about:  Your medical history, including: ? Any past pregnancies. ? Your family's medical history. ? The baby's father's medical history. ? Any long-term (chronic) health conditions you have and how you manage them. ? Any surgeries or procedures you have had. ? Any current over-the-counter or prescription medicines, herbs, or supplements you are taking.  Other factors that could pose a risk to your  baby, including:  Your home setting and your stress levels, including: ? Exposure to abuse or violence. ? Household financial strain. ? Mental health conditions you have.  Your daily health  habits, including diet and exercise. Your health care provider will also:  Measure your weight, height, and blood pressure.  Do a physical exam, including a pelvic and breast exam.  Perform blood tests and urine tests to check for: ? Urinary tract infection. ? Sexually transmitted infections (STIs). ? Low iron levels in your blood (anemia). ? Blood type and certain proteins on red blood cells (Rh antibodies). ? Infections and immunity to viruses, such as hepatitis B and rubella. ? HIV (human immunodeficiency virus).  Do an ultrasound to confirm your baby's growth and development and to help predict your estimated due date (EDD). This ultrasound is done with a probe that is inserted into the vagina (transvaginal ultrasound).  Discuss your options for genetic screening.  Give you information about how to keep yourself and your baby healthy, including: ? Nutrition and taking vitamins. ? Physical activity. ? How to manage pregnancy symptoms such as nausea and vomiting (morning sickness). ? Infections and substances that may be harmful to your baby and how to avoid them. ? Food safety. ? Dental care. ? Working. ? Travel. ? Warning signs to watch for and when to call your health care provider. How often will I have prenatal care visits? After your first prenatal care visit, you will have regular visits throughout your pregnancy. The visit schedule is often as follows:  Up to week 28 of pregnancy: once every 4 weeks.  28-36 weeks: once every 2 weeks.  After 36 weeks: every week until delivery. Some women may have visits more or less often depending on any underlying health conditions and the health of the baby. Keep all follow-up and prenatal care visits as told by your health care provider. This is important. What happens during routine prenatal care visits? Your health care provider will:  Measure your weight and blood pressure.  Check for fetal heart sounds.  Measure the  height of your uterus in your abdomen (fundal height). This may be measured starting around week 20 of pregnancy.  Check the position of your baby inside your uterus.  Ask questions about your diet, sleeping patterns, and whether you can feel the baby move.  Review warning signs to watch for and signs of labor.  Ask about any pregnancy symptoms you are having and how you are dealing with them. Symptoms may include: ? Headaches. ? Nausea and vomiting. ? Vaginal discharge. ? Swelling. ? Fatigue. ? Constipation. ? Any discomfort, including back or pelvic pain. Make a list of questions to ask your health care provider at your routine visits. What tests might I have during prenatal care visits? You may have blood, urine, and imaging tests throughout your pregnancy, such as:  Urine tests to check for glucose, protein, or signs of infection.  Glucose tests to check for a form of diabetes that can develop during pregnancy (gestational diabetes mellitus). This is usually done around week 24 of pregnancy.  An ultrasound to check your baby's growth and development and to check for birth defects. This is usually done around week 20 of pregnancy.  A test to check for group B strep (GBS) infection. This is usually done around week 36 of pregnancy.  Genetic testing. This may include blood or imaging tests, such as an ultrasound. Some genetic tests are done during the first trimester and  some are done during the second trimester. What else can I expect during prenatal care visits? Your health care provider may recommend getting certain vaccines during pregnancy. These may include:  A yearly flu shot (annual influenza vaccine). This is especially important if you will be pregnant during flu season.  Tdap (tetanus, diphtheria, pertussis) vaccine. Getting this vaccine during pregnancy can protect your baby from whooping cough (pertussis) after birth. This vaccine may be recommended between weeks 27  and 36 of pregnancy. Later in your pregnancy, your health care provider may give you information about:  Childbirth and breastfeeding classes.  Choosing a health care provider for your baby.  Umbilical cord banking.  Breastfeeding.  Birth control after your baby is born.  The hospital labor and delivery unit and how to tour it.  Registering at the hospital before you go into labor. Where to find more information  Office on Women's Health: TravelLesson.ca  American Pregnancy Association: americanpregnancy.org  March of Dimes: marchofdimes.org Summary  Prenatal care helps you and your baby stay as healthy as possible during pregnancy.  Your first prenatal care visit will most likely be the longest.  You will have visits and tests throughout your pregnancy to monitor your health and your baby's health.  Bring a list of questions to your visits to ask your health care provider.  Make sure to keep all follow-up and prenatal care visits with your health care provider. This information is not intended to replace advice given to you by your health care provider. Make sure you discuss any questions you have with your health care provider. Document Revised: 11/25/2018 Document Reviewed: 08/04/2017 Elsevier Patient Education  2020 [employer].         First Trimester of Pregnancy The first trimester of pregnancy is from week 1 until the end of week 13 (months 1 through 3). A week after a sperm fertilizes an egg, the egg will implant on the wall of the uterus. This embryo will begin to develop into a baby. Genes from you and your partner will form the baby. The female genes will determine whether the baby will be a boy or a girl. At 6-8 weeks, the eyes and face will be formed, and the heartbeat can be seen on ultrasound. At the end of 12 weeks, all the baby's organs will be formed. Now that you are pregnant, you will want to do everything you can to have a healthy baby. Two of the  most important things are to get good prenatal care and to follow your health care provider's instructions. Prenatal care is all the medical care you receive before the baby's birth. This care will help prevent, find, and treat any problems during the pregnancy and childbirth. Body changes during your first trimester Your body goes through many changes during pregnancy. The changes vary from woman to woman.  You may gain or lose a couple of pounds at first.  You may feel sick to your stomach (nauseous) and you may throw up (vomit). If the vomiting is uncontrollable, call your health care provider.  You may tire easily.  You may develop headaches that can be relieved by medicines. All medicines should be approved by your health care provider.  You may urinate more often. Painful urination may mean you have a bladder infection.  You may develop heartburn as a result of your pregnancy.  You may develop constipation because certain hormones are causing the muscles that push stool through your intestines to slow down.  You may develop hemorrhoids or swollen veins (varicose veins).  Your breasts may begin to grow larger and become tender. Your nipples may stick out more, and the tissue that surrounds them (areola) may become darker.  Your gums may bleed and may be sensitive to brushing and flossing.  Dark spots or blotches (chloasma, mask of pregnancy) may develop on your face. This will likely fade after the baby is born.  Your menstrual periods will stop.  You may have a loss of appetite.  You may develop cravings for certain kinds of food.  You may have changes in your emotions from day to day, such as being excited to be pregnant or being concerned that something may go wrong with the pregnancy and baby.  You may have more vivid and strange dreams.  You may have changes in your hair. These can include thickening of your hair, rapid growth, and changes in texture. Some women also  have hair loss during or after pregnancy, or hair that feels dry or thin. Your hair will most likely return to normal after your baby is born. What to expect at prenatal visits During a routine prenatal visit:  You will be weighed to make sure you and the baby are growing normally.  Your blood pressure will be taken.  Your abdomen will be measured to track your baby's growth.  The fetal heartbeat will be listened to between weeks 10 and 14 of your pregnancy.  Test results from any previous visits will be discussed. Your health care provider may ask you:  How you are feeling.  If you are feeling the baby move.  If you have had any abnormal symptoms, such as leaking fluid, bleeding, severe headaches, or abdominal cramping.  If you are using any tobacco products, including cigarettes, chewing tobacco, and electronic cigarettes.  If you have any questions. Other tests that may be performed during your first trimester include:  Blood tests to find your blood type and to check for the presence of any previous infections. The tests will also be used to check for low iron levels (anemia) and protein on red blood cells (Rh antibodies). Depending on your risk factors, or if you previously had diabetes during pregnancy, you may have tests to check for high blood sugar that affects pregnant women (gestational diabetes).  Urine tests to check for infections, diabetes, or protein in the urine.  An ultrasound to confirm the proper growth and development of the baby.  Fetal screens for spinal cord problems (spina bifida) and Down syndrome.  HIV (human immunodeficiency virus) testing. Routine prenatal testing includes screening for HIV, unless you choose not to have this test.  You may need other tests to make sure you and the baby are doing well. Follow these instructions at home: Medicines  Follow your health care provider's instructions regarding medicine use. Specific medicines may be  either safe or unsafe to take during pregnancy.  Take a prenatal vitamin that contains at least 600 micrograms (mcg) of folic acid.  If you develop constipation, try taking a stool softener if your health care provider approves. Eating and drinking   Eat a balanced diet that includes fresh fruits and vegetables, whole grains, good sources of protein such as meat, eggs, or tofu, and low-fat dairy. Your health care provider will help you determine the amount of weight gain that is right for you.  Avoid raw meat and uncooked cheese. These carry germs that can cause birth defects in the baby.  Eating  four or five small meals rather than three large meals a day may help relieve nausea and vomiting. If you start to feel nauseous, eating a few soda crackers can be helpful. Drinking liquids between meals, instead of during meals, also seems to help ease nausea and vomiting.  Limit foods that are high in fat and processed sugars, such as fried and sweet foods.  To prevent constipation: ? Eat foods that are high in fiber, such as fresh fruits and vegetables, whole grains, and beans. ? Drink enough fluid to keep your urine clear or pale yellow. Activity  Exercise only as directed by your health care provider. Most women can continue their usual exercise routine during pregnancy. Try to exercise for 30 minutes at least 5 days a week. Exercising will help you: ? Control your weight. ? Stay in shape. ? Be prepared for labor and delivery.  Experiencing pain or cramping in the lower abdomen or lower back is a good sign that you should stop exercising. Check with your health care provider before continuing with normal exercises.  Try to avoid standing for long periods of time. Move your legs often if you must stand in one place for a long time.  Avoid heavy lifting.  Wear low-heeled shoes and practice good posture.  You may continue to have sex unless your health care provider tells you not  to. Relieving pain and discomfort  Wear a good support bra to relieve breast tenderness.  Take warm sitz baths to soothe any pain or discomfort caused by hemorrhoids. Use hemorrhoid cream if your health care provider approves.  Rest with your legs elevated if you have leg cramps or low back pain.  If you develop varicose veins in your legs, wear support hose. Elevate your feet for 15 minutes, 3-4 times a day. Limit salt in your diet. Prenatal care  Schedule your prenatal visits by the twelfth week of pregnancy. They are usually scheduled monthly at first, then more often in the last 2 months before delivery.  Write down your questions. Take them to your prenatal visits.  Keep all your prenatal visits as told by your health care provider. This is important. Safety  Wear your seat belt at all times when driving.  Make a list of emergency phone numbers, including numbers for family, friends, the hospital, and police and fire departments. General instructions  Ask your health care provider for a referral to a local prenatal education class. Begin classes no later than the beginning of month 6 of your pregnancy.  Ask for help if you have counseling or nutritional needs during pregnancy. Your health care provider can offer advice or refer you to specialists for help with various needs.  Do not use hot tubs, steam rooms, or saunas.  Do not douche or use tampons or scented sanitary pads.  Do not cross your legs for long periods of time.  Avoid cat litter boxes and soil used by cats. These carry germs that can cause birth defects in the baby and possibly loss of the fetus by miscarriage or stillbirth.  Avoid all smoking, herbs, alcohol, and medicines not prescribed by your health care provider. Chemicals in these products affect the formation and growth of the baby.  Do not use any products that contain nicotine or tobacco, such as cigarettes and e-cigarettes. If you need help quitting,  ask your health care provider. You may receive counseling support and other resources to help you quit.  Schedule a dentist appointment. At home,  brush your teeth with a soft toothbrush and be gentle when you floss. Contact a health care provider if:  You have dizziness.  You have mild pelvic cramps, pelvic pressure, or nagging pain in the abdominal area.  You have persistent nausea, vomiting, or diarrhea.  You have a bad smelling vaginal discharge.  You have pain when you urinate.  You notice increased swelling in your face, hands, legs, or ankles.  You are exposed to fifth disease or chickenpox.  You are exposed to [place] measles (rubella) and have never had it. Get help right away if:  You have a fever.  You are leaking fluid from your vagina.  You have spotting or bleeding from your vagina.  You have severe abdominal cramping or pain.  You have rapid weight gain or loss.  You vomit blood or material that looks like coffee grounds.  You develop a severe headache.  You have shortness of breath.  You have any kind of trauma, such as from a fall or a car accident. Summary  The first trimester of pregnancy is from week 1 until the end of week 13 (months 1 through 3).  Your body goes through many changes during pregnancy. The changes vary from woman to woman.  You will have routine prenatal visits. During those visits, your health care provider will examine you, discuss any test results you may have, and talk with you about how you are feeling. This information is not intended to replace advice given to you by your health care provider. Make sure you discuss any questions you have with your health care provider. Document Revised: 07/18/2017 Document Reviewed: 07/17/2016 Elsevier Patient Education  2020 [employer].

## 2020-03-06 NOTE — MAU Provider Note (Signed)
History     CSN: [id number]  Arrival date and time: 03/06/20 1506   First Provider Initiated Contact with Patient 03/06/20 1541      Chief Complaint  Patient presents with  . Abdominal Pain   Cindy Cruz is a 34 y.o. [id number] at [redacted]w[redacted]d who presents to MAU for pelvic cramping and also to get checked for a yeast infection. Patient reports thick, white vaginal discharge with vaginal itching. Patient reports heartburn, but has successfully treated it with over-the-counter heartburn medication.  Onset: two days Location: pelvis Duration: two days Character: menstrual-like cramping Aggravating/Associated: none/none Relieving: none Treatment: none Severity: 0/10  Pt denies VB, vaginal odor. Pt denies N/V, abdominal pain, constipation, diarrhea, or urinary problems. Pt denies fever, chills, fatigue, sweating or changes in appetite. Pt denies SOB or chest pain. Pt denies dizziness, HA, light-headedness, weakness.  Problems this pregnancy include: pt has not yet been seen. Allergies? NKDA Current medications/supplements? PNVs Prenatal care provider? [facility] 03/20/2020   OB History    Gravida  8   Para  6   Term  6   Preterm  0   AB  1   Living  6     SAB  0   TAB  0   Ectopic  0   Multiple  0   Live Births  6           Past Medical History:  Diagnosis Date  . 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 Depression    ok now  . GERD (gastroesophageal reflux disease)    indigestion with pregnancy only; Rx med helps; pt cannot remember name of med  . Trichomoniasis     Past Surgical History:  Procedure Laterality Date  . WISDOM TOOTH EXTRACTION      Family History  Problem Relation Age of Onset  . Hypertension Mother   . Cancer Maternal Aunt        breast  . Hearing loss Neg Hx     Social History   Tobacco Use  . Smoking status: Light Tobacco Smoker    Packs/day: 0.25    Years: 10.00    Pack years: 2.50    Types:  Cigarettes  . Smokeless tobacco: Never Used  Vaping Use  . Vaping Use: Never used  Substance Use Topics  . Alcohol use: Not Currently    Comment: socially on occ.-liquor  . Drug use: No    Allergies: No Known Allergies  Medications Prior to Admission  Medication Sig Dispense Refill Last Dose  . calcium carbonate (TUMS - DOSED IN MG ELEMENTAL CALCIUM) 500 MG chewable tablet Chew 4 tablets by mouth daily.       Review of Systems  Constitutional: Negative for chills, diaphoresis, fatigue and fever.  Eyes: Negative for visual disturbance.  Respiratory: Negative for shortness of breath.   Cardiovascular: Negative for chest pain.  Gastrointestinal: Negative for abdominal pain, constipation, diarrhea, nausea and vomiting.  Genitourinary: Positive for pelvic pain (pt describes as cramping, not present at this time). Negative for dysuria, flank pain, frequency, urgency, vaginal bleeding and vaginal discharge.  Neurological: Negative for dizziness, weakness, light-headedness and headaches.   Physical Exam   Blood pressure 113/61, pulse 74, temperature 98.4 F (36.9 C), temperature source Oral, resp. rate 16, weight 90.9 kg, last menstrual period 12/30/2019, SpO2 99 %, not currently breastfeeding.  Patient Vitals for the past 24 hrs:  BP Temp Temp src Pulse Resp SpO2 Weight  03/06/20  1531 113/61 98.4 F (36.9 C) Oral 74 16 99 % 90.9 kg   Physical Exam Vitals and nursing note reviewed.  Constitutional:      General: She is not in acute distress.    Appearance: Normal appearance. She is not ill-appearing, toxic-appearing or diaphoretic.  HENT:     Head: Normocephalic and atraumatic.  Pulmonary:     Effort: Pulmonary effort is normal.  Neurological:     Mental Status: She is alert and oriented to person, place, and time.  Psychiatric:        Mood and Affect: Mood normal.        Behavior: Behavior normal.        Thought Content: Thought content normal.        Judgment: Judgment  normal.    Results for orders placed or performed during the hospital encounter of 03/06/20 (from the past 24 hour(s))  Urinalysis, Routine w reflex microscopic     Status: Abnormal   Collection Time: 03/06/20  3:51 PM  Result Value Ref Range   Color, Urine YELLOW YELLOW   APPearance HAZY (A) CLEAR   Specific Gravity, Urine 1.026 1.005 - 1.030   pH 6.0 5.0 - 8.0   Glucose, UA NEGATIVE NEGATIVE mg/dL   Hgb urine dipstick NEGATIVE NEGATIVE   Bilirubin Urine NEGATIVE NEGATIVE   Ketones, ur NEGATIVE NEGATIVE mg/dL   Protein, ur NEGATIVE NEGATIVE mg/dL   Nitrite NEGATIVE NEGATIVE   Leukocytes,Ua NEGATIVE NEGATIVE   RBC / HPF 0-5 0 - 5 RBC/hpf   WBC, UA 0-5 0 - 5 WBC/hpf   Bacteria, UA NONE SEEN NONE SEEN   Squamous Epithelial / LPF 0-5 0 - 5   Mucus PRESENT   Wet prep, genital     Status: Abnormal   Collection Time: 03/06/20  3:56 PM  Result Value Ref Range   Yeast Wet Prep HPF POC NONE SEEN NONE SEEN   Trich, Wet Prep NONE SEEN NONE SEEN   Clue Cells Wet Prep HPF POC PRESENT (A) NONE SEEN   WBC, Wet Prep HPF POC FEW (A) NONE SEEN   Sperm NONE SEEN   CBC     Status: Abnormal   Collection Time: 03/06/20  4:07 PM  Result Value Ref Range   WBC 7.9 4.0 - 10.5 K/uL   RBC 4.08 3.87 - 5.11 MIL/uL   Hemoglobin 11.2 (L) 12.0 - 15.0 g/dL   HCT 24.2 (L) 36 - 46 %   MCV 86.8 80.0 - 100.0 fL   MCH 27.5 26.0 - 34.0 pg   MCHC 31.6 30.0 - 36.0 g/dL   RDW 35.3 (H) 61.4 - 43.1 %   Platelets 359 150 - 400 K/uL   nRBC 0.0 0.0 - 0.2 %    US OB Comp Less 14 Wks  Result Date: 03/06/2020 CLINICAL DATA:  Pregnant, assess fetal viability, low abdominal pain EXAM: OBSTETRIC <14 WK ULTRASOUND TECHNIQUE: Transabdominal ultrasound was performed for evaluation of the gestation as well as the maternal uterus and adnexal regions. COMPARISON:  02/11/2020 FINDINGS: Intrauterine gestational sac: Present, single Yolk sac:  Single, normal-appearing Embryo:  Now identified, single Cardiac Activity: Present Heart  Rate: 176 bpm MSD: Appropriate in relation to the size of the fetal pole CRL:   21 mm mm   8 w 4 d                  [place] EDC: 10/12/2020 Subchorionic hemorrhage:  None visualized. Maternal uterus/adnexae: No uterine masses are seen. The  cervix is closed within estimated length of 4.6 cm. The placenta is not well visualized given the early phase of gestation. There is no free fluid within the cul-de-sac. The maternal right ovary is normal in size and echogenicity. The left ovary is not visualized. IMPRESSION: Single living intrauterine gestation with an estimated gestational age of [redacted] weeks, 4 days. Estimated date of delivery is 10/12/2020. Appropriate interval growth since prior examination. Electronically Signed   By: Helyn NumbersAshesh  Parikh MD   On: 03/06/2020 16:47   US OB LESS THAN 14 WEEKS WITH OB TRANSVAGINAL  Result Date: 02/11/2020 CLINICAL DATA:  Abdominal pain in first trimester of pregnancy, LEFT lower quadrant pain for 2 days, LMP 12/30/2019 EXAM: OBSTETRIC <14 WK US AND TRANSVAGINAL OB US TECHNIQUE: Both transabdominal and transvaginal ultrasound examinations were performed for complete evaluation of the gestation as well as the maternal uterus, adnexal regions, and pelvic cul-de-sac. Transvaginal technique was performed to assess early pregnancy. COMPARISON:  None for this gestation FINDINGS: Intrauterine gestational sac: Present, single Yolk sac:  Present Embryo:  Not identified Cardiac Activity: N/A Heart Rate: N/A  bpm MSD: 7.2 mm   5 w   3 d Subchorionic hemorrhage:  Large subchronic hemorrhage Maternal uterus/adnexae: RIGHT ovary measures 3.3 x 1.7 x 2.3 cm and contains a small corpus luteum. LEFT ovary normal size and morphology 2.5 x 3.6 x 1.3 cm. No free pelvic fluid or adnexal masses. IMPRESSION: Intrauterine gestational sac containing a yolk sac but no fetal pole is identified to establish viability; may consider follow-up ultrasound in 14 days to establish viability if clinically indicated. Large  subchronic hemorrhage. Electronically Signed   By: Ulyses SouthwardMark  Boles M.D.   On: 02/11/2020 17:59    MAU Course  Procedures  MDM -pregnancy with uncertain fetal viability -UA: hazy, otherwise WNL, sending urine for culture based on symptoms -CBC: no abnormalities requiring treatment -CMP: pending at time of discharge -US: single IUP, +yolk sac, FHR 176, 3881w4d -hCG: pending at time of discharge -ABO: O Positive -WetPrep: +ClueCells, will treat for yeast based on symptoms -GC/CT collected -pt discharged to home in stable condition  Orders Placed This Encounter  Procedures  . Wet prep, genital    Standing Status:   Standing    Number of Occurrences:   1  . Culture, OB Urine    Standing Status:   Standing    Number of Occurrences:   1  . US OB Comp Less 14 Wks    Standing Status:   Standing    Number of Occurrences:   1    Order Specific Question:   Symptom/Reason for Exam    Answer:   Pregnancy with uncertain fetal viability [[id number]][[id number]]  . Urinalysis, Routine w reflex microscopic    Standing Status:   Standing    Number of Occurrences:   1  . CBC    Standing Status:   Standing    Number of Occurrences:   1  . Comprehensive metabolic panel    Standing Status:   Standing    Number of Occurrences:   1  . hCG, quantitative, pregnancy    Standing Status:   Standing    Number of Occurrences:   1  . Discharge patient    Order Specific Question:   Discharge disposition    Answer:   01-Home or Self Care [1]    Order Specific Question:   Discharge patient date    Answer:   03/06/2020   Meds ordered this encounter  Medications  . terconazole (TERAZOL 7) 0.4 % vaginal cream    Sig: Place 1 applicator vaginally at bedtime for 7 days.    Dispense:  45 g    Refill:  0    Order Specific Question:   Supervising Provider    Answer:   Jaynie Collins A [[id number]]   Assessment and Plan   1. Intrauterine pregnancy   2. Pregnancy with uncertain fetal viability   3. Vaginal yeast infection   4.  Blood type, Rh positive     Allergies as of 03/06/2020   No Known Allergies     Medication List    TAKE these medications   calcium carbonate 500 MG chewable tablet Commonly known as: TUMS - dosed in mg elemental calcium Chew 4 tablets by mouth daily.   terconazole 0.4 % vaginal cream Commonly known as: TERAZOL 7 Place 1 applicator vaginally at bedtime for 7 days.       -will call with culture results, if positive -RX terconazole -start prenatal care -safe meds in pregnancy list given -return MAU precautions discussed -pt discharged to home in stable condition  Joni Reining E [place] 03/06/2020, 5:19 PM

## 2020-03-06 NOTE — MAU Note (Signed)
Pt states that she is having lower abdominal cramping that started 2 days ago.   Pt reports foul vaginal odor and itching.

## 2020-03-07 LAB — CULTURE, OB URINE: Culture: NO GROWTH

## 2020-03-07 LAB — GC/CHLAMYDIA PROBE AMP (~~LOC~~) NOT AT ARMC
Chlamydia: NEGATIVE
Comment: NEGATIVE
Comment: NORMAL
Neisseria Gonorrhea: NEGATIVE

## 2020-03-20 ENCOUNTER — Encounter: Payer: Self-pay | Admitting: Obstetrics

## 2020-03-20 ENCOUNTER — Other Ambulatory Visit: Payer: Self-pay

## 2020-03-20 ENCOUNTER — Ambulatory Visit (INDEPENDENT_AMBULATORY_CARE_PROVIDER_SITE_OTHER): Payer: Self-pay | Admitting: Obstetrics

## 2020-03-20 ENCOUNTER — Other Ambulatory Visit (HOSPITAL_COMMUNITY)
Admission: RE | Admit: 2020-03-20 | Discharge: 2020-03-20 | Disposition: A | Discharge status: Home / Self Care | Payer: Medicaid Other | Source: Ambulatory Visit | Attending: Obstetrics | Admitting: Obstetrics

## 2020-03-20 VITALS — BP 123/85 | HR 89 | Wt 197.9 lb

## 2020-03-20 DIAGNOSIS — Z3480 Encounter for supervision of other normal pregnancy, unspecified trimester: Secondary | ICD-10-CM | POA: Diagnosis not present

## 2020-03-20 DIAGNOSIS — O99331 Smoking (tobacco) complicating pregnancy, first trimester: Secondary | ICD-10-CM

## 2020-03-20 DIAGNOSIS — K219 Gastro-esophageal reflux disease without esophagitis: Secondary | ICD-10-CM

## 2020-03-20 DIAGNOSIS — F17210 Nicotine dependence, cigarettes, uncomplicated: Secondary | ICD-10-CM

## 2020-03-20 DIAGNOSIS — O0941 Supervision of pregnancy with grand multiparity, first trimester: Secondary | ICD-10-CM

## 2020-03-20 DIAGNOSIS — Z3A10 10 weeks gestation of pregnancy: Secondary | ICD-10-CM

## 2020-03-20 MED ORDER — BLOOD PRESSURE KIT DEVI: 1.0000 | 0 refills | Status: DC | PRN

## 2020-03-20 MED ORDER — OMEPRAZOLE 20 MG PO CPDR: 20.0000 mg | DELAYED_RELEASE_CAPSULE | Freq: Two times a day (BID) | ORAL | 5 refills | Status: DC

## 2020-03-20 NOTE — Addendum Note (Signed)
Addended by: Coral Ceo A on: 03/20/2020 11:05 AM   Modules accepted: Orders

## 2020-03-20 NOTE — Progress Notes (Signed)
Subjective:    Cindy Cruz is being seen today for her first obstetrical visit.  This is a planned pregnancy. She is at 30w4dgestation. Her obstetrical history is significant for grandmultiparity. Relationship with FOB: significant other, living together. Patient does intend to breast feed. Pregnancy history fully reviewed.  The information documented in the HPI was reviewed and verified.  Menstrual History: OB History    Gravida  8   Para  6   Term  6   Preterm  0   AB  1   Living  6     SAB  0   TAB  0   Ectopic  0   Multiple  0   Live Births  6            Patient's last menstrual period was 12/30/2019.    Past Medical History:  Diagnosis Date  . 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 Depression    ok now  . GERD (gastroesophageal reflux disease)    indigestion with pregnancy only; Rx med helps; pt cannot remember name of med  . Trichomoniasis     Past Surgical History:  Procedure Laterality Date  . WISDOM TOOTH EXTRACTION      (Not in a hospital admission)  No Known Allergies  Social History   Tobacco Use  . Smoking status: Light Tobacco Smoker    Packs/day: 0.25    Years: 10.00    Pack years: 2.50    Types: Cigarettes  . Smokeless tobacco: Never Used  Substance Use Topics  . Alcohol use: Not Currently    Comment: socially on occ.-liquor    Family History  Problem Relation Age of Onset  . Hypertension Mother   . Cancer Maternal Aunt        breast  . Hearing loss Neg Hx      Review of Systems Constitutional: negative for weight loss Gastrointestinal: negative for vomiting Genitourinary:negative for genital lesions and vaginal discharge and dysuria Musculoskeletal:negative for back pain Behavioral/Psych: negative for abusive relationship, depression, illegal drug usage and tobacco use    Objective:    BP (!) 137/87   Pulse 94   Wt 197 lb 14.4 oz (89.8 kg)   LMP 12/30/2019   BMI 32.93 kg/m  General  Appearance:    Alert, cooperative, no distress, appears stated age  Head:    Normocephalic, without obvious abnormality, atraumatic  Eyes:    PERRL, conjunctiva/corneas clear, EOM's intact, fundi    benign, both eyes  Ears:    Normal TM's and external ear canals, both ears  Nose:   Nares normal, septum midline, mucosa normal, no drainage    or sinus tenderness  Throat:   Lips, mucosa, and tongue normal; teeth and gums normal  Neck:   Supple, symmetrical, trachea midline, no adenopathy;    thyroid:  no enlargement/tenderness/nodules; no carotid   bruit or JVD  Back:     Symmetric, no curvature, ROM normal, no CVA tenderness  Lungs:     Clear to auscultation bilaterally, respirations unlabored  Chest Wall:    No tenderness or deformity   Heart:    Regular rate and rhythm, S1 and S2 normal, no murmur, rub   or gallop  Breast Exam:    No tenderness, masses, or nipple abnormality  Abdomen:     Soft, non-tender, bowel sounds active all four quadrants,    no masses, no organomegaly  Genitalia:    Normal female  without lesion, discharge or tenderness  Extremities:   Extremities normal, atraumatic, no cyanosis or edema  Pulses:   2+ and symmetric all extremities  Skin:   Skin color, texture, turgor normal, no rashes or lesions  Lymph nodes:   Cervical, supraclavicular, and axillary nodes normal  Neurologic:   CNII-XII intact, normal strength, sensation and reflexes    throughout      Lab Review Urine pregnancy test Labs reviewed yes Radiologic studies reviewed yes    Assessment:    Pregnancy at 77w4dweeks    Plan:     1. Supervision of other normal pregnancy, antepartum Rx: - Blood Pressure Monitoring (BLOOD PRESSURE KIT) DEVI; 1 kit by Does not apply route as needed. Large cuff  Dispense: 1 each; Refill: 0 - Babyscripts Schedule Optimization - Cytology - PAP( [place]) - Cervicovaginal ancillary only( [place]) - Culture, OB Urine - Obstetric Panel, Including HIV -  Hepatitis C Antibody - Genetic Screening  Prenatal vitamins.  Counseling provided regarding continued use of seat belts, cessation of alcohol consumption, smoking or use of illicit drugs; infection precautions i.e., influenza/TDAP immunizations, toxoplasmosis,CMV, parvovirus, listeria and varicella; workplace safety, exercise during pregnancy; routine dental care, safe medications, sexual activity, hot tubs, saunas, pools, travel, caffeine use, fish and methlymercury, potential toxins, hair treatments, varicose veins Weight gain recommendations per IOM guidelines reviewed: underweight/BMI< 18.5--> gain 28 - 40 lbs; normal weight/BMI 18.5 - 24.9--> gain 25 - 35 lbs; overweight/BMI 25 - 29.9--> gain 15 - 25 lbs; obese/BMI >30->gain  11 - 20 lbs Problem list reviewed and updated. FIRST/CF mutation testing/NIPT/QUAD SCREEN/fragile X/Ashkenazi Jewish population testing/Spinal muscular atrophy discussed: requested. Role of ultrasound in pregnancy discussed; fetal survey: requested. Amniocentesis discussed: not indicated.  Meds ordered this encounter  Medications  . Blood Pressure Monitoring (BLOOD PRESSURE KIT) DEVI    Sig: 1 kit by Does not apply route as needed. Large cuff    Dispense:  1 each    Refill:  0   Orders Placed This Encounter  Procedures  . Culture, OB Urine  . Obstetric Panel, Including HIV  . Hepatitis C Antibody  . Genetic Screening    Follow up in 4 weeks. 50% of 25 min visit spent on counseling and coordination of care.     HShelly Bombard MD 03/20/2020 10:45 AM

## 2020-03-21 ENCOUNTER — Other Ambulatory Visit: Payer: Self-pay | Admitting: Obstetrics

## 2020-03-21 ENCOUNTER — Encounter: Payer: Self-pay | Admitting: *Deleted

## 2020-03-21 DIAGNOSIS — B3731 Acute candidiasis of vulva and vagina: Secondary | ICD-10-CM

## 2020-03-21 DIAGNOSIS — B373 Candidiasis of vulva and vagina: Secondary | ICD-10-CM

## 2020-03-21 DIAGNOSIS — B9689 Other specified bacterial agents as the cause of diseases classified elsewhere: Secondary | ICD-10-CM

## 2020-03-21 LAB — OBSTETRIC PANEL, INCLUDING HIV
Antibody Screen: NEGATIVE
Basophils Absolute: 0 10*3/uL (ref 0.0–0.2)
Basos: 0 %
EOS (ABSOLUTE): 0.1 10*3/uL (ref 0.0–0.4)
Eos: 2 %
HIV Screen 4th Generation wRfx: NONREACTIVE
Hematocrit: 36.8 % (ref 34.0–46.6)
Hemoglobin: 12.2 g/dL (ref 11.1–15.9)
Hepatitis B Surface Ag: NEGATIVE
Immature Grans (Abs): 0 10*3/uL (ref 0.0–0.1)
Immature Granulocytes: 0 %
Lymphocytes Absolute: 2.5 10*3/uL (ref 0.7–3.1)
Lymphs: 32 %
MCH: 28 pg (ref 26.6–33.0)
MCHC: 33.2 g/dL (ref 31.5–35.7)
MCV: 84 fL (ref 79–97)
Monocytes Absolute: 0.4 10*3/uL (ref 0.1–0.9)
Monocytes: 6 %
Neutrophils Absolute: 4.7 10*3/uL (ref 1.4–7.0)
Neutrophils: 60 %
Platelets: 349 10*3/uL (ref 150–450)
RBC: 4.36 x10E6/uL (ref 3.77–5.28)
RDW: 15.1 % (ref 11.7–15.4)
RPR Ser Ql: NONREACTIVE
Rh Factor: POSITIVE
Rubella Antibodies, IGG: 1.48 index (ref >0.99)
WBC: 7.8 10*3/uL (ref 3.4–10.8)

## 2020-03-21 LAB — CERVICOVAGINAL ANCILLARY ONLY
Bacterial Vaginitis (gardnerella): POSITIVE — AB
Candida Glabrata: NEGATIVE
Candida Vaginitis: POSITIVE — AB
Chlamydia: NEGATIVE
Comment: NEGATIVE
Comment: NEGATIVE
Comment: NEGATIVE
Comment: NEGATIVE
Comment: NEGATIVE
Comment: NORMAL
Neisseria Gonorrhea: NEGATIVE
Trichomonas: NEGATIVE

## 2020-03-21 LAB — CYTOLOGY - PAP
Comment: NEGATIVE
Diagnosis: NEGATIVE
High risk HPV: NEGATIVE

## 2020-03-21 LAB — HEPATITIS C ANTIBODY: Hep C Virus Ab: <0.1 s/co ratio (ref 0.0–0.9)

## 2020-03-21 MED ORDER — TERCONAZOLE 0.4 % VA CREA: 1.0000 | TOPICAL_CREAM | Freq: Every day | VAGINAL | 0 refills | Status: DC

## 2020-03-21 MED ORDER — METRONIDAZOLE 500 MG PO TABS: 500.0000 mg | ORAL_TABLET | Freq: Two times a day (BID) | ORAL | 2 refills | Status: DC

## 2020-03-22 LAB — CULTURE, OB URINE

## 2020-03-22 LAB — URINE CULTURE, OB REFLEX

## 2020-03-27 ENCOUNTER — Encounter: Payer: Self-pay | Admitting: Obstetrics

## 2020-04-03 ENCOUNTER — Encounter: Payer: Self-pay | Admitting: Obstetrics

## 2020-04-06 ENCOUNTER — Encounter: Payer: Self-pay | Admitting: Obstetrics

## 2020-04-12 ENCOUNTER — Other Ambulatory Visit: Payer: Self-pay

## 2020-04-12 ENCOUNTER — Encounter (HOSPITAL_COMMUNITY): Payer: Self-pay

## 2020-04-12 ENCOUNTER — Ambulatory Visit (HOSPITAL_COMMUNITY)
Admission: EM | Admit: 2020-04-12 | Discharge: 2020-04-12 | Disposition: A | Discharge status: Home / Self Care | Payer: Medicaid Other | Attending: Family Medicine | Admitting: Family Medicine

## 2020-04-12 DIAGNOSIS — Z20822 Contact with and (suspected) exposure to covid-19: Secondary | ICD-10-CM | POA: Diagnosis present

## 2020-04-12 LAB — SARS CORONAVIRUS 2 (TAT 6-24 HRS): SARS Coronavirus 2: NEGATIVE

## 2020-04-12 NOTE — ED Triage Notes (Signed)
Pt is here wanting COVID test, pt is denying ALL sxs today.

## 2020-04-12 NOTE — Discharge Instructions (Signed)

## 2020-04-19 ENCOUNTER — Other Ambulatory Visit: Payer: Self-pay

## 2020-04-19 ENCOUNTER — Ambulatory Visit (INDEPENDENT_AMBULATORY_CARE_PROVIDER_SITE_OTHER): Payer: Medicaid Other | Admitting: Obstetrics

## 2020-04-19 ENCOUNTER — Encounter: Payer: Self-pay | Admitting: Obstetrics

## 2020-04-19 VITALS — BP 113/81 | HR 89 | Wt 200.9 lb

## 2020-04-19 DIAGNOSIS — Z3480 Encounter for supervision of other normal pregnancy, unspecified trimester: Secondary | ICD-10-CM

## 2020-04-19 MED ORDER — VITAFOL ULTRA 29-0.6-0.4-200 MG PO CAPS: 1.0000 | ORAL_CAPSULE | Freq: Every day | ORAL | 4 refills | Status: DC

## 2020-04-19 NOTE — Progress Notes (Signed)
ROB   CC: None   Pt notes nausea and vomiting has got better.  Pt was not able to get refill on PNV's states Rx was not at pharmacy .

## 2020-04-19 NOTE — Progress Notes (Signed)
Subjective:  Cindy Cruz is a 34 y.o. [id number] at [redacted]w[redacted]d being seen today for ongoing prenatal care.  She is currently monitored for the following issues for this low-risk pregnancy and has Trichomonal vaginitis during pregnancy in second trimester; Grand multipara; Post-dates pregnancy; SVD (spontaneous vaginal delivery); and Supervision of other normal pregnancy, antepartum on their problem list.  Patient reports no complaints.  Contractions: Not present. Vag. Bleeding: None.  Movement: Present. Denies leaking of fluid.   The following portions of the patient's history were reviewed and updated as appropriate: allergies, current medications, past family history, past medical history, past social history, past surgical history and problem list. Problem list updated.  Objective:   Vitals:   04/19/20 1002  BP: 113/81  Pulse: 89  Weight: 200 lb 14.4 oz (91.1 kg)    Fetal Status: Fetal Heart Rate (bpm): 158   Movement: Present     General:  Alert, oriented and cooperative. Patient is in no acute distress.  Skin: Skin is warm and dry. No rash noted.   Cardiovascular: Normal heart rate noted  Respiratory: Normal respiratory effort, no problems with respiration noted  Abdomen: Soft, gravid, appropriate for gestational age. Pain/Pressure: Absent     Pelvic:  Cervical exam deferred        Extremities: Normal range of motion.  Edema: None  Mental Status: Normal mood and affect. Normal behavior. Normal judgment and thought content.   Urinalysis:      Assessment and Plan:  Pregnancy: [id number] at [redacted]w[redacted]d  1. Supervision of other normal pregnancy, antepartum Rx: - Prenat-Fe Poly-Methfol-FA-DHA (VITAFOL ULTRA) 29-0.6-0.4-200 MG CAPS; Take 1 capsule by mouth daily before breakfast.  Dispense: 90 capsule; Refill: 4  Preterm labor symptoms and general obstetric precautions including but not limited to vaginal bleeding, contractions, leaking of fluid and fetal movement were reviewed in detail with  the patient. Please refer to After Visit Summary for other counseling recommendations.   Return in about 4 weeks (around 05/17/2020) for MyChart.   Brock Bad, MD  04/19/20

## 2020-05-17 ENCOUNTER — Telehealth (INDEPENDENT_AMBULATORY_CARE_PROVIDER_SITE_OTHER): Payer: Medicaid Other | Admitting: Obstetrics and Gynecology

## 2020-05-17 DIAGNOSIS — Z3A18 18 weeks gestation of pregnancy: Secondary | ICD-10-CM

## 2020-05-17 DIAGNOSIS — O0942 Supervision of pregnancy with grand multiparity, second trimester: Secondary | ICD-10-CM

## 2020-05-17 DIAGNOSIS — Z641 Problems related to multiparity: Secondary | ICD-10-CM

## 2020-05-17 DIAGNOSIS — Z3480 Encounter for supervision of other normal pregnancy, unspecified trimester: Secondary | ICD-10-CM

## 2020-05-17 NOTE — Progress Notes (Signed)
S/w pt for virtual visit, pt reports feeling fetal flutter movements, denies pain. Pt reports that she does not have BP cuff with her today, and her primary phone is broken so she can only do tele-visit today.

## 2020-05-17 NOTE — Patient Instructions (Signed)

## 2020-05-17 NOTE — Progress Notes (Signed)
   OBSTETRICS PRENATAL VIRTUAL VISIT ENCOUNTER NOTE  Provider location: [facility] [place]   I connected with Cindy Cruz on 05/17/20 at 10:30 AM EDT by MyChart Video Encounter at home and verified that I am speaking with the correct person using two identifiers.   I discussed the limitations, risks, security and privacy concerns of performing an evaluation and management service virtually and the availability of in person appointments. I also discussed with the patient that there may be a patient responsible charge related to this service. The patient expressed understanding and agreed to proceed. Subjective:  Cindy Cruz is a 34 y.o. [id number] at [redacted]w[redacted]d being seen today for ongoing prenatal care.  She is currently monitored for the following issues for this low-risk pregnancy and has Trichomonal vaginitis during pregnancy in second trimester; Grand multipara; Post-dates pregnancy; SVD (spontaneous vaginal delivery); and Supervision of other normal pregnancy, antepartum on their problem list.  Patient reports no complaints.  Contractions: Not present. Vag. Bleeding: None.  Movement: Present. Denies any leaking of fluid.   The following portions of the patient's history were reviewed and updated as appropriate: allergies, current medications, past family history, past medical history, past social history, past surgical history and problem list.   Objective:  There were no vitals filed for this visit.  Fetal Status:     Movement: Present     General:  Alert, oriented and cooperative. Patient is in no acute distress.  Respiratory: Normal respiratory effort, no problems with respiration noted  Mental Status: Normal mood and affect. Normal behavior. Normal judgment and thought content.  Rest of physical exam deferred due to type of encounter  Imaging: No results found.  Assessment and Plan:  Pregnancy: [id number] at [redacted]w[redacted]d 1. Grand multipara   2. Supervision of  other normal pregnancy, antepartum  - [place] MFM OB DETAIL +14 WK; Future - AFP, Serum, Open Spina Bifida  Preterm labor symptoms and general obstetric precautions including but not limited to vaginal bleeding, contractions, leaking of fluid and fetal movement were reviewed in detail with the patient. I discussed the assessment and treatment plan with the patient. The patient was provided an opportunity to ask questions and all were answered. The patient agreed with the plan and demonstrated an understanding of the instructions. The patient was advised to call back or seek an in-person office evaluation/go to MAU at [facility] for any urgent or concerning symptoms. Please refer to After Visit Summary for other counseling recommendations.   I provided 10 minutes of face-to-face time during this encounter.  Return in about 4 weeks (around 06/14/2020) for ROB, in person.  No future appointments.  Warden Fillers, MD [facility] for [employer], [facility]

## 2020-05-22 ENCOUNTER — Encounter (HOSPITAL_COMMUNITY): Payer: Self-pay | Admitting: Obstetrics and Gynecology

## 2020-05-22 ENCOUNTER — Other Ambulatory Visit: Payer: Self-pay

## 2020-05-22 ENCOUNTER — Inpatient Hospital Stay (HOSPITAL_COMMUNITY)
Admission: AD | Admit: 2020-05-22 | Discharge: 2020-05-22 | Disposition: A | Discharge status: Home / Self Care | Payer: Medicaid Other | Attending: Obstetrics and Gynecology | Admitting: Obstetrics and Gynecology

## 2020-05-22 DIAGNOSIS — O99612 Diseases of the digestive system complicating pregnancy, second trimester: Secondary | ICD-10-CM | POA: Insufficient documentation

## 2020-05-22 DIAGNOSIS — R109 Unspecified abdominal pain: Secondary | ICD-10-CM

## 2020-05-22 DIAGNOSIS — R102 Pelvic and perineal pain: Secondary | ICD-10-CM | POA: Insufficient documentation

## 2020-05-22 DIAGNOSIS — F17210 Nicotine dependence, cigarettes, uncomplicated: Secondary | ICD-10-CM | POA: Insufficient documentation

## 2020-05-22 DIAGNOSIS — B373 Candidiasis of vulva and vagina: Secondary | ICD-10-CM | POA: Insufficient documentation

## 2020-05-22 DIAGNOSIS — O26892 Other specified pregnancy related conditions, second trimester: Secondary | ICD-10-CM | POA: Diagnosis present

## 2020-05-22 DIAGNOSIS — Z3A19 19 weeks gestation of pregnancy: Secondary | ICD-10-CM | POA: Insufficient documentation

## 2020-05-22 DIAGNOSIS — K219 Gastro-esophageal reflux disease without esophagitis: Secondary | ICD-10-CM | POA: Insufficient documentation

## 2020-05-22 DIAGNOSIS — O99332 Smoking (tobacco) complicating pregnancy, second trimester: Secondary | ICD-10-CM | POA: Diagnosis not present

## 2020-05-22 DIAGNOSIS — O98812 Other maternal infectious and parasitic diseases complicating pregnancy, second trimester: Secondary | ICD-10-CM | POA: Insufficient documentation

## 2020-05-22 DIAGNOSIS — B3731 Acute candidiasis of vulva and vagina: Secondary | ICD-10-CM

## 2020-05-22 LAB — URINALYSIS, ROUTINE W REFLEX MICROSCOPIC
Bilirubin Urine: NEGATIVE
Glucose, UA: NEGATIVE mg/dL
Hgb urine dipstick: NEGATIVE
Ketones, ur: NEGATIVE mg/dL
Nitrite: NEGATIVE
Protein, ur: NEGATIVE mg/dL
Specific Gravity, Urine: 1.017 (ref 1.005–1.030)
pH: 6 (ref 5.0–8.0)

## 2020-05-22 MED ORDER — COMFORT FIT MATERNITY SUPP LG MISC
1.0000 | Freq: Every day | 0 refills | Status: DC
Start: 2020-05-22 — End: 2020-10-05

## 2020-05-22 MED ORDER — COMFORT FIT MATERNITY SUPP LG MISC
1.0000 | Freq: Every day | 0 refills | Status: DC
Start: 2020-05-22 — End: 2020-05-22

## 2020-05-22 MED ORDER — TERCONAZOLE 0.4 % VA CREA: 1.0000 | TOPICAL_CREAM | Freq: Every day | VAGINAL | 0 refills | Status: DC

## 2020-05-22 NOTE — Discharge Instructions (Signed)
Round Ligament Pain  The round ligament is a cord of muscle and tissue that helps support the uterus. It can become a source of pain during pregnancy if it becomes stretched or twisted as the baby grows. The pain usually begins in the second trimester (13-28 weeks) of pregnancy, and it can come and go until the baby is delivered. It is not a serious problem, and it does not cause harm to the baby. Round ligament pain is usually a short, sharp, and pinching pain, but it can also be a dull, lingering, and aching pain. The pain is felt in the lower side of the abdomen or in the groin. It usually starts deep in the groin and moves up to the outside of the hip area. The pain may occur when you:  Suddenly change position, such as quickly going from a sitting to standing position.  Roll over in bed.  Cough or sneeze.  Do physical activity. Follow these instructions at home:   Watch your condition for any changes.  When the pain starts, relax. Then try any of these methods to help with the pain: ? Sitting down. ? Flexing your knees up to your abdomen. ? Lying on your side with one pillow under your abdomen and another pillow between your legs. ? Sitting in a warm bath for 15-20 minutes or until the pain goes away.  Take over-the-counter and prescription medicines only as told by your health care provider.  Move slowly when you sit down or stand up.  Avoid long walks if they cause pain.  Stop or reduce your physical activities if they cause pain.  Keep all follow-up visits as told by your health care provider. This is important. Contact a health care provider if:  Your pain does not go away with treatment.  You feel pain in your back that you did not have before.  Your medicine is not helping. Get help right away if:  You have a fever or chills.  You develop uterine contractions.  You have vaginal bleeding.  You have nausea or vomiting.  You have diarrhea.  You have pain  when you urinate. Summary  Round ligament pain is felt in the lower abdomen or groin. It is usually a short, sharp, and pinching pain. It can also be a dull, lingering, and aching pain.  This pain usually begins in the second trimester (13-28 weeks). It occurs because the uterus is stretching with the growing baby, and it is not harmful to the baby.  You may notice the pain when you suddenly change position, when you cough or sneeze, or during physical activity.  Relaxing, flexing your knees to your abdomen, lying on one side, or taking a warm bath may help to get rid of the pain.  Get help from your health care provider if the pain does not go away or if you have vaginal bleeding, nausea, vomiting, diarrhea, or painful urination. This information is not intended to replace advice given to you by your health care provider. Make sure you discuss any questions you have with your health care provider. Document Revised: 01/21/2018 Document Reviewed: 01/21/2018 Elsevier Patient Education  2020 Elsevier Inc.   Vaginal Yeast Infection, Adult  Vaginal yeast infection is a condition that causes vaginal discharge as well as soreness, swelling, and redness (inflammation) of the vagina. This is a common condition. Some women get this infection frequently. What are the causes? This condition is caused by a change in the normal balance of the  yeast (candida) and bacteria that live in the vagina. This change causes an overgrowth of yeast, which causes the inflammation. What increases the risk? The condition is more likely to develop in women who:  Take antibiotic medicines.  Have diabetes.  Take birth control pills.  Are pregnant.  Douche often.  Have a weak body defense system (immune system).  Have been taking steroid medicines for a long time.  Frequently wear tight clothing. What are the signs or symptoms? Symptoms of this condition include:  White, thick, creamy vaginal  discharge.  Swelling, itching, redness, and irritation of the vagina. The lips of the vagina (vulva) may be affected as well.  Pain or a burning feeling while urinating.  Pain during sex. How is this diagnosed? This condition is diagnosed based on:  Your medical history.  A physical exam.  A pelvic exam. Your health care provider will examine a sample of your vaginal discharge under a microscope. Your health care provider may send this sample for testing to confirm the diagnosis. How is this treated? This condition is treated with medicine. Medicines may be over-the-counter or prescription. You may be told to use one or more of the following:  Medicine that is taken by mouth (orally).  Medicine that is applied as a cream (topically).  Medicine that is inserted directly into the vagina (suppository). Follow these instructions at home:  Lifestyle  Do not have sex until your health care provider approves. Tell your sex partner that you have a yeast infection. That person should go to his or her health care provider and ask if they should also be treated.  Do not wear tight clothes, such as pantyhose or tight pants.  Wear breathable cotton underwear. General instructions  Take or apply over-the-counter and prescription medicines only as told by your health care provider.  Eat more yogurt. This may help to keep your yeast infection from returning.  Do not use tampons until your health care provider approves.  Try taking a sitz bath to help with discomfort. This is a warm water bath that is taken while you are sitting down. The water should only come up to your hips and should cover your buttocks. Do this 3-4 times per day or as told by your health care provider.  Do not douche.  If you have diabetes, keep your blood sugar levels under control.  Keep all follow-up visits as told by your health care provider. This is important. Contact a health care provider if:  You have a  fever.  Your symptoms go away and then return.  Your symptoms do not get better with treatment.  Your symptoms get worse.  You have new symptoms.  You develop blisters in or around your vagina.  You have blood coming from your vagina and it is not your menstrual period.  You develop pain in your abdomen. Summary  Vaginal yeast infection is a condition that causes discharge as well as soreness, swelling, and redness (inflammation) of the vagina.  This condition is treated with medicine. Medicines may be over-the-counter or prescription.  Take or apply over-the-counter and prescription medicines only as told by your health care provider.  Do not douche. Do not have sex or use tampons until your health care provider approves.  Contact a health care provider if your symptoms do not get better with treatment or your symptoms go away and then return. This information is not intended to replace advice given to you by your health care provider. Make  sure you discuss any questions you have with your health care provider. Document Revised: 03/05/2019 Document Reviewed: 12/22/2017 Elsevier Patient Education  2020 Elsevier Inc.    PREGNANCY SUPPORT BELT: You are not alone, Seventy-five percent of women have some sort of abdominal or back pain at some point in their pregnancy. Your baby is growing at a fast pace, which means that your whole body is rapidly trying to adjust to the changes. As your uterus grows, your back may start feeling a bit under stress and this can result in back or abdominal pain that can go from mild, and therefore bearable, to severe pains that will not allow you to sit or lay down comfortably, When it comes to dealing with pregnancy-related pains and cramps, some pregnant women usually prefer natural remedies, which the market is filled with nowadays. For example, wearing a pregnancy support belt can help ease and lessen your discomfort and pain. WHAT ARE THE BENEFITS  OF WEARING A PREGNANCY SUPPORT BELT? A pregnancy support belt provides support to the lower portion of the belly taking some of the weight of the growing uterus and distributing to the other parts of your body. It is designed make you comfortable and gives you extra support. Over the years, the pregnancy apparel market has been studying the needs and wants of pregnant women and they have come up with the most comfortable pregnancy support belts that woman could ever ask for. In fact, you will no longer have to wear a stretched-out or bulky pregnancy belt that is visible underneath your clothes and makes you feel even more uncomfortable. Nowadays, a pregnancy support belt is made of comfortable and stretchy materials that will not irritate your skin but will actually make you feel at ease and you will not even notice you are wearing it. They are easy to put on and adjust during the day and can be worn at night for additional support.  BENEFITS: . Relives Back pain . Relieves Abdominal Muscle and Leg Pain . Stabilizes the Pelvic Ring . Offers a Cushioned Abdominal Lift Pad . Relieves pressure on the Sciatic Nerve Within Minutes WHERE TO GET YOUR PREGNANCY BELT: [employer] [phone] @[address]  [address] [place], [place]

## 2020-05-22 NOTE — MAU Note (Signed)
Pt reports pain in lower abd off/on x 3 days. Also reports white vaginal discharge with vaginal itching.

## 2020-05-22 NOTE — MAU Provider Note (Signed)
History     CSN: [id number]  Arrival date and time: 05/22/20 2139   None     Chief Complaint  Patient presents with  . Vaginal Discharge  . Abdominal Pain   34 y.o. [id number] _0 .4 wks presenting with vaginal irritation and LAP. Vaginal irritation and itching has been ongoing for almost 2 months. Reports thick white discharge. She was treated with Terazol but sx came back. LAP started 3-4 days ago. Pain is bilateral and intermittent. Pain is worse with movement. Rates pain 7/10. Has not tried anything for it. Denies urinary sx. No VB.    OB History    Gravida  8   Para  6   Term  6   Preterm  0   AB  1   Living  6     SAB  0   TAB  0   Ectopic  0   Multiple  0   Live Births  6           Past Medical History:  Diagnosis Date  . 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 Depression    ok now  . GERD (gastroesophageal reflux disease)    indigestion with pregnancy only; Rx med helps; pt cannot remember name of med  . Trichomoniasis     Past Surgical History:  Procedure Laterality Date  . WISDOM TOOTH EXTRACTION      Family History  Problem Relation Age of Onset  . Hypertension Mother   . Cancer Maternal Aunt        breast  . Hearing loss Neg Hx     Social History   Tobacco Use  . Smoking status: Light Tobacco Smoker    Packs/day: 0.25    Years: 10.00    Pack years: 2.50    Types: Cigarettes  . Smokeless tobacco: Never Used  Vaping Use  . Vaping Use: Never used  Substance Use Topics  . Alcohol use: Not Currently    Comment: socially on occ.-liquor  . Drug use: No    Allergies: No Known Allergies  No medications prior to admission.    Review of Systems  Constitutional: Negative for chills and fever.  Gastrointestinal: Positive for abdominal pain. Negative for constipation, diarrhea, nausea and vomiting.  Genitourinary: Positive for vaginal discharge. Negative for dysuria and vaginal bleeding.   Physical Exam    Blood pressure 139/71, pulse 92, temperature 98.7 F (37.1 C), temperature source Oral, resp. rate 17, height _1  (1.651 m), weight 90.3 kg, last menstrual period 12/30/2019, SpO2 97 %, not currently breastfeeding.  Physical Exam Constitutional:      General: She is not in acute distress (appears comfortable).    Appearance: Normal appearance.  HENT:     Head: Normocephalic and atraumatic.  Cardiovascular:     Rate and Rhythm: Normal rate.  Pulmonary:     Effort: Pulmonary effort is normal. No respiratory distress.  Abdominal:     General: There is no distension.     Palpations: Abdomen is soft.     Tenderness: There is no abdominal tenderness.  Genitourinary:    Comments: External: no lesions, mild erythema, thick white discharge Cervix closed/long  Musculoskeletal:        General: Normal range of motion.     Cervical back: Normal range of motion.  Skin:    General: Skin is warm and dry.  Neurological:     General: No focal deficit present.  Mental Status: She is alert and oriented to person, place, and time.  Psychiatric:        Mood and Affect: Mood normal.   FHT 151  Results for orders placed or performed during the hospital encounter of 05/22/20 (from the past 24 hour(s))  Urinalysis, Routine w reflex microscopic Urine, Clean Catch     Status: Abnormal   Collection Time: 05/22/20 10:03 PM  Result Value Ref Range   Color, Urine YELLOW YELLOW   APPearance HAZY (A) CLEAR   Specific Gravity, Urine 1.017 1.005 - 1.030   pH 6.0 5.0 - 8.0   Glucose, UA NEGATIVE NEGATIVE mg/dL   Hgb urine dipstick NEGATIVE NEGATIVE   Bilirubin Urine NEGATIVE NEGATIVE   Ketones, ur NEGATIVE NEGATIVE mg/dL   Protein, ur NEGATIVE NEGATIVE mg/dL   Nitrite NEGATIVE NEGATIVE   Leukocytes,Ua SMALL (A) NEGATIVE   RBC / HPF 0-5 0 - 5 RBC/hpf   WBC, UA 6-10 0 - 5 WBC/hpf   Bacteria, UA RARE (A) NONE SEEN   Squamous Epithelial / LPF 11-20 0 - 5   Mucus PRESENT    MAU Course   Procedures  MDM Labs ordered and reviewed. No signs of UTI or threatened SAB. Pain likely RL/MSK, discussed comfort measures. Will treat yeast. Stable for discharge home.   Assessment and Plan   1. [redacted] weeks gestation of pregnancy   2. Yeast vaginitis   3. Candida vaginitis   4. Pain of round ligament during pregnancy    Discharge home Follow up at [facility] as scheduled SAB precautions Rx Terazol Rx maternity belt  Allergies as of 05/22/2020   No Known Allergies     Medication List    STOP taking these medications   metroNIDAZOLE 500 MG tablet Commonly known as: FLAGYL   Vitafol Ultra 29-0.6-0.4-200 MG Caps     TAKE these medications   Blood Pressure Kit Devi 1 kit by Does not apply route as needed. Large cuff   calcium carbonate 500 MG chewable tablet Commonly known as: TUMS - dosed in mg elemental calcium Chew 4 tablets by mouth daily.   Comfort Fit Maternity Supp Lg Misc 1 application by Does not apply route daily.   multivitamin-prenatal 27-0.8 MG Tabs tablet Take 1 tablet by mouth daily at 12 noon.   omeprazole 20 MG capsule Commonly known as: PriLOSEC Take 1 capsule (20 mg total) by mouth 2 (two) times daily before a meal.   terconazole 0.4 % vaginal cream Commonly known as: TERAZOL 7 Place 1 applicator vaginally at bedtime.      Julianne Handler, CNM 05/23/2020, 2:54 AM

## 2020-05-30 ENCOUNTER — Other Ambulatory Visit: Payer: Self-pay | Admitting: *Deleted

## 2020-05-30 ENCOUNTER — Other Ambulatory Visit: Payer: Self-pay

## 2020-05-30 ENCOUNTER — Ambulatory Visit: Payer: Medicaid Other

## 2020-05-30 ENCOUNTER — Ambulatory Visit: Payer: Medicaid Other | Attending: Obstetrics and Gynecology

## 2020-05-30 DIAGNOSIS — Z3480 Encounter for supervision of other normal pregnancy, unspecified trimester: Secondary | ICD-10-CM | POA: Insufficient documentation

## 2020-05-30 DIAGNOSIS — Z6841 Body Mass Index (BMI) 40.0 and over, adult: Secondary | ICD-10-CM

## 2020-05-31 ENCOUNTER — Other Ambulatory Visit: Payer: Medicaid Other

## 2020-06-14 ENCOUNTER — Encounter: Payer: Self-pay | Admitting: Obstetrics and Gynecology

## 2020-06-14 ENCOUNTER — Ambulatory Visit (INDEPENDENT_AMBULATORY_CARE_PROVIDER_SITE_OTHER): Payer: Medicaid Other | Admitting: Obstetrics and Gynecology

## 2020-06-14 ENCOUNTER — Other Ambulatory Visit (HOSPITAL_COMMUNITY)
Admission: RE | Admit: 2020-06-14 | Discharge: 2020-06-14 | Disposition: A | Discharge status: Home / Self Care | Payer: Medicaid Other | Source: Ambulatory Visit | Attending: Obstetrics and Gynecology | Admitting: Obstetrics and Gynecology

## 2020-06-14 ENCOUNTER — Other Ambulatory Visit: Payer: Self-pay

## 2020-06-14 VITALS — BP 116/74 | HR 86 | Wt 197.0 lb

## 2020-06-14 DIAGNOSIS — N898 Other specified noninflammatory disorders of vagina: Secondary | ICD-10-CM

## 2020-06-14 DIAGNOSIS — O0992 Supervision of high risk pregnancy, unspecified, second trimester: Secondary | ICD-10-CM

## 2020-06-14 DIAGNOSIS — O23592 Infection of other part of genital tract in pregnancy, second trimester: Secondary | ICD-10-CM

## 2020-06-14 DIAGNOSIS — Z641 Problems related to multiparity: Secondary | ICD-10-CM

## 2020-06-14 DIAGNOSIS — Z148 Genetic carrier of other disease: Secondary | ICD-10-CM

## 2020-06-14 DIAGNOSIS — A5901 Trichomonal vulvovaginitis: Secondary | ICD-10-CM | POA: Diagnosis present

## 2020-06-14 DIAGNOSIS — Z3480 Encounter for supervision of other normal pregnancy, unspecified trimester: Secondary | ICD-10-CM

## 2020-06-14 DIAGNOSIS — Z3A22 22 weeks gestation of pregnancy: Secondary | ICD-10-CM

## 2020-06-14 NOTE — Patient Instructions (Signed)

## 2020-06-14 NOTE — Progress Notes (Signed)
ROB/AFP 

## 2020-06-14 NOTE — Progress Notes (Signed)
   PRENATAL VISIT NOTE  Subjective:  Cindy Cruz is a 34 y.o. [phone] at [redacted]w[redacted]d being seen today for ongoing prenatal care.  She is currently monitored for the following issues for this high-risk pregnancy and has Trichomonal vaginitis during pregnancy in second trimester; Grand multipara; Supervision of other normal pregnancy, antepartum; and [redacted] weeks gestation of pregnancy on their problem list.  Patient reports vaginal itching and discomfort s/p vaginal intercourse.  Contractions: Not present. Vag. Bleeding: None.  Movement: Present. Denies leaking of fluid.   The following portions of the patient's history were reviewed and updated as appropriate: allergies, current medications, past family history, past medical history, past social history, past surgical history and problem list.   Objective:   Vitals:   06/14/20 1036  BP: 116/74  Pulse: 86  Weight: 197 lb (89.4 kg)    Fetal Status: Fetal Heart Rate (bpm): 148   Movement: Present     General:  Alert, oriented and cooperative. Patient is in no acute distress.  Skin: Skin is warm and dry. No rash noted.   Cardiovascular: Normal heart rate noted  Respiratory: Normal respiratory effort, no problems with respiration noted  Abdomen: Soft, gravid, appropriate for gestational age.  Pain/Pressure: Absent     Pelvic: Cervical exam deferred        Extremities: Normal range of motion.  Edema: None  Mental Status: Normal mood and affect. Normal behavior. Normal judgment and thought content.   Assessment and Plan:  Pregnancy: [id number] at [redacted]w[redacted]d 1. Supervision of high risk pregnancy in second trimester, [redacted] weeks gestation of pregnancy: No concerns other than vaginal itching and discomfort as noted below. No red flag symptoms. Pt endorses +fetal movement and +FHTs today. Normal anatomy scan at [redacted]w[redacted]d with EFW 69%. Given obesity and grand multiparity pt has f/u growth ultrasound scheduled for 12/21. 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 Grand multipara: All prior vaginal deliveries. High risk of PPH. - plan for f/u growth ultrasond on 12/21 per MFM  4. Vaginal itching: Pt reports vaginal itching and discomfort s/p vaginal intercourse. No concern of abnormal discharge.  - cervicovaginal swab today  Preterm labor symptoms and general obstetric precautions including but not limited to vaginal bleeding, contractions, leaking of fluid and fetal movement were reviewed in detail with the patient. Please refer to After Visit Summary for other counseling recommendations.   No follow-ups on file.  Future Appointments  Date Time Provider Department Center  08/08/2020  1:45 PM [facility] NURSE [facility]  08/08/2020  2:00 PM [facility] US1 [facility]   Sheila Oats, MD OB Fellow, [facility] 06/15/2020 7:48 AM

## 2020-06-15 LAB — CERVICOVAGINAL ANCILLARY ONLY
Bacterial Vaginitis (gardnerella): POSITIVE — AB
Candida Glabrata: NEGATIVE
Candida Vaginitis: NEGATIVE
Chlamydia: NEGATIVE
Comment: NEGATIVE
Comment: NEGATIVE
Comment: NEGATIVE
Comment: NEGATIVE
Comment: NEGATIVE
Comment: NORMAL
Neisseria Gonorrhea: NEGATIVE
Trichomonas: NEGATIVE

## 2020-06-17 LAB — AFP, SERUM, OPEN SPINA BIFIDA
AFP MoM: 1.94
AFP Value: 151.5 ng/mL
Gest. Age on Collection Date: 22.9 weeks
Maternal Age At EDD: 34 yr
OSBR Risk 1 IN: 1844
Test Results:: NEGATIVE
Weight: 197 [lb_av]

## 2020-06-19 ENCOUNTER — Telehealth: Payer: Self-pay | Admitting: Obstetrics and Gynecology

## 2020-06-19 DIAGNOSIS — B9689 Other specified bacterial agents as the cause of diseases classified elsewhere: Secondary | ICD-10-CM

## 2020-06-19 MED ORDER — METRONIDAZOLE 500 MG PO TABS: 500.0000 mg | ORAL_TABLET | Freq: Two times a day (BID) | ORAL | 0 refills | Status: DC

## 2020-06-19 NOTE — Telephone Encounter (Signed)
Attempted to call pt but no answer and voicemail box not setup. Left message in MyChart regarding +bacterial vaginosis in setting of vaginal symptoms. Sent prescription for flagyl to pt's preferred pharmacy.  Sheila Oats, MD OB Fellow, [facility] 06/19/2020 11:36 AM

## 2020-07-03 ENCOUNTER — Inpatient Hospital Stay (HOSPITAL_COMMUNITY): Payer: Medicaid Other

## 2020-07-03 ENCOUNTER — Inpatient Hospital Stay (HOSPITAL_COMMUNITY)
Admission: AD | Admit: 2020-07-03 | Discharge: 2020-07-07 | DRG: 832 | Disposition: A | Discharge status: Home / Self Care | Payer: Medicaid Other | Attending: Obstetrics and Gynecology | Admitting: Obstetrics and Gynecology

## 2020-07-03 ENCOUNTER — Other Ambulatory Visit: Payer: Self-pay

## 2020-07-03 ENCOUNTER — Encounter (HOSPITAL_COMMUNITY): Payer: Self-pay | Admitting: Obstetrics and Gynecology

## 2020-07-03 DIAGNOSIS — Z3A25 25 weeks gestation of pregnancy: Secondary | ICD-10-CM

## 2020-07-03 DIAGNOSIS — O98812 Other maternal infectious and parasitic diseases complicating pregnancy, second trimester: Secondary | ICD-10-CM | POA: Diagnosis present

## 2020-07-03 DIAGNOSIS — O99613 Diseases of the digestive system complicating pregnancy, third trimester: Secondary | ICD-10-CM | POA: Diagnosis present

## 2020-07-03 DIAGNOSIS — E876 Hypokalemia: Secondary | ICD-10-CM | POA: Diagnosis present

## 2020-07-03 DIAGNOSIS — O211 Hyperemesis gravidarum with metabolic disturbance: Secondary | ICD-10-CM | POA: Diagnosis not present

## 2020-07-03 DIAGNOSIS — O99891 Other specified diseases and conditions complicating pregnancy: Secondary | ICD-10-CM | POA: Diagnosis not present

## 2020-07-03 DIAGNOSIS — F17210 Nicotine dependence, cigarettes, uncomplicated: Secondary | ICD-10-CM | POA: Diagnosis present

## 2020-07-03 DIAGNOSIS — K219 Gastro-esophageal reflux disease without esophagitis: Secondary | ICD-10-CM | POA: Diagnosis present

## 2020-07-03 DIAGNOSIS — B373 Candidiasis of vulva and vagina: Secondary | ICD-10-CM | POA: Diagnosis present

## 2020-07-03 DIAGNOSIS — N179 Acute kidney failure, unspecified: Secondary | ICD-10-CM | POA: Diagnosis not present

## 2020-07-03 DIAGNOSIS — Z20822 Contact with and (suspected) exposure to covid-19: Secondary | ICD-10-CM | POA: Diagnosis present

## 2020-07-03 DIAGNOSIS — B3731 Acute candidiasis of vulva and vagina: Secondary | ICD-10-CM | POA: Diagnosis present

## 2020-07-03 DIAGNOSIS — O99282 Endocrine, nutritional and metabolic diseases complicating pregnancy, second trimester: Secondary | ICD-10-CM | POA: Diagnosis present

## 2020-07-03 DIAGNOSIS — Z3A26 26 weeks gestation of pregnancy: Secondary | ICD-10-CM | POA: Diagnosis not present

## 2020-07-03 DIAGNOSIS — O99332 Smoking (tobacco) complicating pregnancy, second trimester: Secondary | ICD-10-CM | POA: Diagnosis present

## 2020-07-03 DIAGNOSIS — Z148 Genetic carrier of other disease: Secondary | ICD-10-CM

## 2020-07-03 DIAGNOSIS — O26832 Pregnancy related renal disease, second trimester: Secondary | ICD-10-CM | POA: Diagnosis present

## 2020-07-03 DIAGNOSIS — O212 Late vomiting of pregnancy: Secondary | ICD-10-CM | POA: Diagnosis present

## 2020-07-03 DIAGNOSIS — R7989 Other specified abnormal findings of blood chemistry: Secondary | ICD-10-CM | POA: Diagnosis not present

## 2020-07-03 DIAGNOSIS — R112 Nausea with vomiting, unspecified: Secondary | ICD-10-CM

## 2020-07-03 DIAGNOSIS — O219 Vomiting of pregnancy, unspecified: Secondary | ICD-10-CM | POA: Diagnosis not present

## 2020-07-03 LAB — COMPREHENSIVE METABOLIC PANEL
ALT: 21 U/L (ref 0–44)
AST: 31 U/L (ref 15–41)
Albumin: 3.6 g/dL (ref 3.5–5.0)
Alkaline Phosphatase: 87 U/L (ref 38–126)
Anion gap: 14 (ref 5–15)
BUN: 33 mg/dL — ABNORMAL HIGH (ref 6–20)
CO2: 15 mmol/L — ABNORMAL LOW (ref 22–32)
Calcium: 10.5 mg/dL — ABNORMAL HIGH (ref 8.9–10.3)
Chloride: 104 mmol/L (ref 98–111)
Creatinine, Ser: 5.39 mg/dL — ABNORMAL HIGH (ref 0.44–1.00)
GFR, Estimated: 10 mL/min — ABNORMAL LOW (ref >60)
Glucose, Bld: 110 mg/dL — ABNORMAL HIGH (ref 70–99)
Potassium: 3.2 mmol/L — ABNORMAL LOW (ref 3.5–5.1)
Sodium: 133 mmol/L — ABNORMAL LOW (ref 135–145)
Total Bilirubin: 0.8 mg/dL (ref 0.3–1.2)
Total Protein: 8.6 g/dL — ABNORMAL HIGH (ref 6.5–8.1)

## 2020-07-03 LAB — CBC
HCT: 39.2 % (ref 36.0–46.0)
Hemoglobin: 12.6 g/dL (ref 12.0–15.0)
MCH: 27.5 pg (ref 26.0–34.0)
MCHC: 32.1 g/dL (ref 30.0–36.0)
MCV: 85.6 fL (ref 80.0–100.0)
Platelets: 408 10*3/uL — ABNORMAL HIGH (ref 150–400)
RBC: 4.58 MIL/uL (ref 3.87–5.11)
RDW: 15 % (ref 11.5–15.5)
WBC: 7.8 10*3/uL (ref 4.0–10.5)
nRBC: 0 % (ref 0.0–0.2)

## 2020-07-03 LAB — CK: Total CK: 59 U/L (ref 38–234)

## 2020-07-03 LAB — TYPE AND SCREEN
ABO/RH(D): O POS
Antibody Screen: NEGATIVE

## 2020-07-03 LAB — RESPIRATORY PANEL BY RT PCR (FLU A&B, COVID)
Influenza A by PCR: NEGATIVE
Influenza B by PCR: NEGATIVE
SARS Coronavirus 2 by RT PCR: NEGATIVE

## 2020-07-03 MED ORDER — LACTATED RINGERS IV SOLN: INTRAVENOUS | Status: DC

## 2020-07-03 MED ORDER — PANTOPRAZOLE SODIUM 40 MG PO TBEC
40.0000 mg | DELAYED_RELEASE_TABLET | Freq: Every day | ORAL | Status: DC
  Administered 2020-07-03 – 2020-07-06 (×4): 40 mg via ORAL
  Filled 2020-07-03 (×5): qty 1

## 2020-07-03 MED ORDER — POTASSIUM CHLORIDE 20 MEQ PO PACK: 20.0000 meq | PACK | Freq: Two times a day (BID) | ORAL | Status: DC

## 2020-07-03 MED ORDER — SODIUM CHLORIDE 0.9 % IV SOLN
8.0000 mg | Freq: Once | INTRAVENOUS | Status: AC
  Administered 2020-07-03: 8 mg via INTRAVENOUS
  Filled 2020-07-03: qty 4

## 2020-07-03 MED ORDER — POTASSIUM CHLORIDE 2 MEQ/ML IV SOLN
INTRAVENOUS | Status: DC
  Filled 2020-07-03 (×8): qty 1000

## 2020-07-03 MED ORDER — ONDANSETRON HCL 4 MG/2ML IJ SOLN
4.0000 mg | Freq: Four times a day (QID) | INTRAMUSCULAR | Status: DC | PRN
  Administered 2020-07-07: 4 mg via INTRAVENOUS
  Filled 2020-07-03: qty 2

## 2020-07-03 MED ORDER — DOCUSATE SODIUM 100 MG PO CAPS
100.0000 mg | ORAL_CAPSULE | Freq: Every day | ORAL | Status: DC
  Administered 2020-07-04 – 2020-07-06 (×3): 100 mg via ORAL
  Filled 2020-07-03 (×4): qty 1

## 2020-07-03 MED ORDER — ZOLPIDEM TARTRATE 5 MG PO TABS
5.0000 mg | ORAL_TABLET | Freq: Every evening | ORAL | Status: DC | PRN
  Administered 2020-07-03 – 2020-07-06 (×4): 5 mg via ORAL
  Filled 2020-07-03 (×4): qty 1

## 2020-07-03 MED ORDER — ACETAMINOPHEN 325 MG PO TABS
650.0000 mg | ORAL_TABLET | ORAL | Status: DC | PRN
  Administered 2020-07-04 – 2020-07-06 (×4): 650 mg via ORAL
  Filled 2020-07-03 (×4): qty 2

## 2020-07-03 MED ORDER — LACTATED RINGERS IV BOLUS
1000.0000 mL | Freq: Once | INTRAVENOUS | Status: AC
  Administered 2020-07-03: 1000 mL via INTRAVENOUS

## 2020-07-03 MED ORDER — POTASSIUM CHLORIDE 10 MEQ/100ML IV SOLN
10.0000 meq | INTRAVENOUS | Status: DC
  Administered 2020-07-03: 10 meq via INTRAVENOUS
  Filled 2020-07-03: qty 100

## 2020-07-03 MED ORDER — POTASSIUM CHLORIDE CRYS ER 20 MEQ PO TBCR
20.0000 meq | EXTENDED_RELEASE_TABLET | Freq: Two times a day (BID) | ORAL | Status: DC
  Administered 2020-07-03 – 2020-07-04 (×2): 20 meq via ORAL
  Filled 2020-07-03 (×2): qty 1

## 2020-07-03 MED ORDER — PRENATAL MULTIVITAMIN CH
1.0000 | ORAL_TABLET | Freq: Every day | ORAL | Status: DC
  Administered 2020-07-04 – 2020-07-06 (×3): 1 via ORAL
  Filled 2020-07-03 (×3): qty 1

## 2020-07-03 NOTE — Progress Notes (Signed)
Patient stated IV does not burn any more.

## 2020-07-03 NOTE — Consult Note (Signed)
Cindy Cruz Admit Date: 07/03/2020 07/03/2020 Cindy Cruz Requesting Physician:  Nehemiah Settle   Reason for Consult:  AKI HPI:  71F [id number] at 25w admitted with several days of nausea/vomiting and inability to tolerate solids or liquids.  She has normal GFR as of July of this year.  Presenting creatinine of 5.39.  She has not been hypertensive.  No UA yet available.  Renal imaging with normal-sized kidneys with no evidence of chronic renal process or obstruction.  Other labs notable for potassium of 3.2, HCO3 of 15 with anion gap of 14.  Hemoglobin 12.6.  Normal platelet count.  Testing for influenza and COVID-19 negative.  She denies recent antibiotics.  No use of NSAIDs.  No headache, vision changes.  No significant edema.  She does complain of leg pain, denies significant recent heavy exertion or weightlifting.   Creatinine, Ser (mg/dL)  Date Value  07/03/2020 5.39 (H)  03/06/2020 0.71  02/11/2020 0.97  ] I/Os:  ROS NSAIDS: Denies use IV Contrast no exposure TMP/SMX denies exposure Hypotension not present Balance of 12 systems is negative w/ exceptions as above  PMH  Past Medical History:  Diagnosis Date  . 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 Depression    ok now  . GERD (gastroesophageal reflux disease)    indigestion with pregnancy only; Rx med helps; pt cannot remember name of med  . Trichomoniasis    PSH  Past Surgical History:  Procedure Laterality Date  . WISDOM TOOTH EXTRACTION     FH  Family History  Problem Relation Age of Onset  . Hypertension Mother   . Cancer Maternal Aunt        breast  . Hearing loss Neg Hx    SH  reports that she has been smoking cigarettes. She has a 2.50 pack-year smoking history. She has never used smokeless tobacco. She reports previous alcohol use. She reports that she does not use drugs. Allergies No Known Allergies Home medications Prior to Admission medications   Medication Sig Start Date End  Date Taking? Authorizing Provider  Blood Pressure Monitoring (BLOOD PRESSURE KIT) DEVI 1 kit by Does not apply route as needed. Large cuff 03/20/20  Yes Shelly Bombard, MD  Elastic Bandages & Supports (COMFORT FIT MATERNITY SUPP LG) MISC 1 application by Does not apply route daily. 05/22/20  Yes Julianne Handler, CNM  Prenatal Vit-Fe Fumarate-FA (MULTIVITAMIN-PRENATAL) 27-0.8 MG TABS tablet Take 1 tablet by mouth daily at 12 noon.   Yes [provider]  calcium carbonate (TUMS - DOSED IN MG ELEMENTAL CALCIUM) 500 MG chewable tablet Chew 4 tablets by mouth daily. Patient not taking: Reported on 03/20/2020    [provider]  metroNIDAZOLE (FLAGYL) 500 MG tablet Take 1 tablet (500 mg total) by mouth 2 (two) times daily. 06/19/20   Randa Ngo, MD  omeprazole (PRILOSEC) 20 MG capsule Take 1 capsule (20 mg total) by mouth 2 (two) times daily before a meal. 03/20/20   Shelly Bombard, MD  terconazole (TERAZOL 7) 0.4 % vaginal cream Place 1 applicator vaginally at bedtime. 05/22/20   Julianne Handler, CNM    Current Medications Scheduled Meds: . Derrill Memo ON 07/04/2020] docusate sodium  100 mg Oral Daily  . pantoprazole  40 mg Oral Daily  . [START ON 07/04/2020] prenatal multivitamin  1 tablet Oral Q1200   Continuous Infusions: . lactated ringers    . potassium chloride 10 mEq (07/03/20 2043)   PRN Meds:.acetaminophen, ondansetron (  ZOFRAN) IV, zolpidem  CBC Recent Labs  Lab 07/03/20 1722  WBC 7.8  HGB 12.6  HCT 39.2  MCV 85.6  PLT 518*   Basic Metabolic Panel Recent Labs  Lab 07/03/20 1722  NA 133*  K 3.2*  CL 104  CO2 15*  GLUCOSE 110*  BUN 33*  CREATININE 5.39*  CALCIUM 10.5*    Physical Exam  Blood pressure 129/83, pulse 93, temperature 98.2 F (36.8 C), temperature source Oral, resp. rate 20, height 5' 5"  (1.651 m), weight 84.6 kg, last menstrual period 12/30/2019, SpO2 100 %, not currently breastfeeding. GEN: NAD ENT: NCAT EYES: EOMI CV: Regular,  normal S1 and S2 PULM: Clear bilaterally ABD: Abdomen is soft SKIN: No rashes, petechia, purpura EXT: No edema   Assessment 69F 25w pregnant with AKI, sig N/V, poor PO.  Hopefully, and most likely, this is prerenal/hypovolemic AKI and will resolve with hydration.  The severity of renal failure is unusual here.  1. AKI, imaging negative.  UA pending. 2. 25 weeks pregnancy 3. Nausea/vomiting, poor p.o. intake 4. Hypokalemia 5. Metabolic acidosis 6. Leg pain  Plan 1. Agree with LR, continue overnight 2. Follow-up on UA, urine sodium 3. Add on CK given myalgias 4. Daily weights, Daily Renal Panel, Strict I/Os, Avoid nephrotoxins (NSAIDs, judicious IV Contrast) 5. We will follow closely along   Cindy Cruz  07/03/2020, 9:08 PM

## 2020-07-03 NOTE — MAU Provider Note (Signed)
History     CSN: [id number]  Arrival date and time: 07/03/20 1657   First Provider Initiated Contact with Patient 07/03/20 1800      Chief Complaint  Patient presents with  . Emesis   HPI This is a 34yo [id number] at 45w4dwho presents with 3 days of vomiting. Patient intolerant of food and liquids, which make her emesis worse. Emesis described as stomach contents. Sleeping is the only palliating factors. Denies abdominal pain, back pain. Has been urinating without difficulty. Denies urinary symptoms.    OB History    Gravida  8   Para  6   Term  6   Preterm  0   AB  1   Living  6     SAB  0   TAB  0   Ectopic  0   Multiple  0   Live Births  6           Past Medical History:  Diagnosis Date  . 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 Depression    ok now  . GERD (gastroesophageal reflux disease)    indigestion with pregnancy only; Rx med helps; pt cannot remember name of med  . Trichomoniasis     Past Surgical History:  Procedure Laterality Date  . WISDOM TOOTH EXTRACTION      Family History  Problem Relation Age of Onset  . Hypertension Mother   . Cancer Maternal Aunt        breast  . Hearing loss Neg Hx     Social History   Tobacco Use  . Smoking status: Light Tobacco Smoker    Packs/day: 0.25    Years: 10.00    Pack years: 2.50    Types: Cigarettes  . Smokeless tobacco: Never Used  Vaping Use  . Vaping Use: Never used  Substance Use Topics  . Alcohol use: Not Currently    Comment: socially on occ.-liquor  . Drug use: No    Allergies: No Known Allergies  Medications Prior to Admission  Medication Sig Dispense Refill Last Dose  . Blood Pressure Monitoring (BLOOD PRESSURE KIT) DEVI 1 kit by Does not apply route as needed. Large cuff 1 each 0 07/03/2020 at Unknown time  . Elastic Bandages & Supports (COMFORT FIT MATERNITY SUPP LG) MISC 1 application by Does not apply route daily. 1 each 0 07/03/2020 at Unknown  time  . Prenatal Vit-Fe Fumarate-FA (MULTIVITAMIN-PRENATAL) 27-0.8 MG TABS tablet Take 1 tablet by mouth daily at 12 noon.   07/03/2020 at Unknown time  . calcium carbonate (TUMS - DOSED IN MG ELEMENTAL CALCIUM) 500 MG chewable tablet Chew 4 tablets by mouth daily. (Patient not taking: Reported on 03/20/2020)     . metroNIDAZOLE (FLAGYL) 500 MG tablet Take 1 tablet (500 mg total) by mouth 2 (two) times daily. 14 tablet 0   . omeprazole (PRILOSEC) 20 MG capsule Take 1 capsule (20 mg total) by mouth 2 (two) times daily before a meal. 60 capsule 5   . terconazole (TERAZOL 7) 0.4 % vaginal cream Place 1 applicator vaginally at bedtime. 45 g 0 not taking    Review of Systems Physical Exam   Blood pressure 121/87, pulse 97, temperature 97.7 F (36.5 C), resp. rate 16, last menstrual period 12/30/2019, SpO2 100 %, not currently breastfeeding.  Physical Exam Vitals reviewed.  Constitutional:      Appearance: Normal appearance.  Cardiovascular:     Rate and Rhythm: Normal rate  and regular rhythm.     Pulses: Normal pulses.  Pulmonary:     Effort: Pulmonary effort is normal.  Abdominal:     General: Abdomen is flat. There is no distension.     Palpations: Abdomen is soft.     Tenderness: There is no abdominal tenderness. There is no guarding.     Hernia: No hernia is present.  Musculoskeletal:     Comments: Tenderness in legs bilaterally.  Skin:    General: Skin is warm and dry.     Capillary Refill: Capillary refill takes less than 2 seconds.  Neurological:     General: No focal deficit present.     Mental Status: She is alert.  Psychiatric:        Mood and Affect: Mood normal.        Behavior: Behavior normal.        Thought Content: Thought content normal.        Judgment: Judgment normal.    Results for orders placed or performed during the hospital encounter of 07/03/20 (from the past 24 hour(s))  CBC     Status: Abnormal   Collection Time: 07/03/20  5:22 PM  Result Value Ref  Range   WBC 7.8 4.0 - 10.5 K/uL   RBC 4.58 3.87 - 5.11 MIL/uL   Hemoglobin 12.6 12.0 - 15.0 g/dL   HCT 39.2 36 - 46 %   MCV 85.6 80.0 - 100.0 fL   MCH 27.5 26.0 - 34.0 pg   MCHC 32.1 30.0 - 36.0 g/dL   RDW 15.0 11.5 - 15.5 %   Platelets 408 (H) 150 - 400 K/uL   nRBC 0.0 0.0 - 0.2 %  Comprehensive metabolic panel     Status: Abnormal   Collection Time: 07/03/20  5:22 PM  Result Value Ref Range   Sodium 133 (L) 135 - 145 mmol/L   Potassium 3.2 (L) 3.5 - 5.1 mmol/L   Chloride 104 98 - 111 mmol/L   CO2 15 (L) 22 - 32 mmol/L   Glucose, Bld 110 (H) 70 - 99 mg/dL   BUN 33 (H) 6 - 20 mg/dL   Creatinine, Ser 5.39 (H) 0.44 - 1.00 mg/dL   Calcium 10.5 (H) 8.9 - 10.3 mg/dL   Total Protein 8.6 (H) 6.5 - 8.1 g/dL   Albumin 3.6 3.5 - 5.0 g/dL   AST 31 15 - 41 U/L   ALT 21 0 - 44 U/L   Alkaline Phosphatase 87 38 - 126 U/L   Total Bilirubin 0.8 0.3 - 1.2 mg/dL   GFR, Estimated 10 (L) >60 mL/min   Anion gap 14 5 - 15     MAU Course  Procedures  MDM 1L IVF started. Zofran $RemoveBeforeD'8mg'[id number]$  given.  CMP shows acute renal failure.  Admit  Assessment and Plan     ICD-10-CM   1. [redacted] weeks gestation of pregnancy  Z3A.25   2. Elevated serum creatinine  R79.89 US RENAL    US RENAL  3. Acute renal failure, unspecified acute renal failure type ([place])  N17.9   4. Intractable vomiting with nausea, unspecified vomiting type  R11.2   5. Hypokalemia  E87.6    Admit Check renal US Check Urine sodium and urine creatinine Consult nephrology - continue IVF and see how improves Cr in AM.   Tanna Savoy [facility] 07/03/2020, 7:18 PM

## 2020-07-03 NOTE — H&P (Addendum)
History     CSN: [id number]  Arrival date and time: 07/03/20 1657   First Provider Initiated Contact with Patient 07/03/20 1800      Chief Complaint  Patient presents with  . Emesis   HPI This is a 34yo [id number] at 45w4dwho presents with 3 days of vomiting. Patient intolerant of food and liquids, which make her emesis worse. Emesis described as stomach contents. Sleeping is the only palliating factors. Denies abdominal pain, back pain. Has been urinating without difficulty. Denies urinary symptoms.    OB History    Gravida  8   Para  6   Term  6   Preterm  0   AB  1   Living  6     SAB  0   TAB  0   Ectopic  0   Multiple  0   Live Births  6           Past Medical History:  Diagnosis Date  . 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 Depression    ok now  . GERD (gastroesophageal reflux disease)    indigestion with pregnancy only; Rx med helps; pt cannot remember name of med  . Trichomoniasis     Past Surgical History:  Procedure Laterality Date  . WISDOM TOOTH EXTRACTION      Family History  Problem Relation Age of Onset  . Hypertension Mother   . Cancer Maternal Aunt        breast  . Hearing loss Neg Hx     Social History   Tobacco Use  . Smoking status: Light Tobacco Smoker    Packs/day: 0.25    Years: 10.00    Pack years: 2.50    Types: Cigarettes  . Smokeless tobacco: Never Used  Vaping Use  . Vaping Use: Never used  Substance Use Topics  . Alcohol use: Not Currently    Comment: socially on occ.-liquor  . Drug use: No    Allergies: No Known Allergies  Medications Prior to Admission  Medication Sig Dispense Refill Last Dose  . Blood Pressure Monitoring (BLOOD PRESSURE KIT) DEVI 1 kit by Does not apply route as needed. Large cuff 1 each 0 07/03/2020 at Unknown time  . Elastic Bandages & Supports (COMFORT FIT MATERNITY SUPP LG) MISC 1 application by Does not apply route daily. 1 each 0 07/03/2020 at Unknown  time  . Prenatal Vit-Fe Fumarate-FA (MULTIVITAMIN-PRENATAL) 27-0.8 MG TABS tablet Take 1 tablet by mouth daily at 12 noon.   07/03/2020 at Unknown time  . calcium carbonate (TUMS - DOSED IN MG ELEMENTAL CALCIUM) 500 MG chewable tablet Chew 4 tablets by mouth daily. (Patient not taking: Reported on 03/20/2020)     . metroNIDAZOLE (FLAGYL) 500 MG tablet Take 1 tablet (500 mg total) by mouth 2 (two) times daily. 14 tablet 0   . omeprazole (PRILOSEC) 20 MG capsule Take 1 capsule (20 mg total) by mouth 2 (two) times daily before a meal. 60 capsule 5   . terconazole (TERAZOL 7) 0.4 % vaginal cream Place 1 applicator vaginally at bedtime. 45 g 0 not taking    Review of Systems Physical Exam   Blood pressure 121/87, pulse 97, temperature 97.7 F (36.5 C), resp. rate 16, last menstrual period 12/30/2019, SpO2 100 %, not currently breastfeeding.  Physical Exam Vitals reviewed.  Constitutional:      Appearance: Normal appearance.  Cardiovascular:     Rate and Rhythm: Normal rate  and regular rhythm.     Pulses: Normal pulses.  Pulmonary:     Effort: Pulmonary effort is normal.  Abdominal:     General: Abdomen is flat. There is no distension.     Palpations: Abdomen is soft.     Tenderness: There is no abdominal tenderness. There is no guarding.     Hernia: No hernia is present.  Musculoskeletal:     Comments: Tenderness in legs bilaterally.  Skin:    General: Skin is warm and dry.     Capillary Refill: Capillary refill takes less than 2 seconds.  Neurological:     General: No focal deficit present.     Mental Status: She is alert.  Psychiatric:        Mood and Affect: Mood normal.        Behavior: Behavior normal.        Thought Content: Thought content normal.        Judgment: Judgment normal.    Results for orders placed or performed during the hospital encounter of 07/03/20 (from the past 24 hour(s))  CBC     Status: Abnormal   Collection Time: 07/03/20  5:22 PM  Result Value Ref  Range   WBC 7.8 4.0 - 10.5 K/uL   RBC 4.58 3.87 - 5.11 MIL/uL   Hemoglobin 12.6 12.0 - 15.0 g/dL   HCT 39.2 36 - 46 %   MCV 85.6 80.0 - 100.0 fL   MCH 27.5 26.0 - 34.0 pg   MCHC 32.1 30.0 - 36.0 g/dL   RDW 15.0 11.5 - 15.5 %   Platelets 408 (H) 150 - 400 K/uL   nRBC 0.0 0.0 - 0.2 %  Comprehensive metabolic panel     Status: Abnormal   Collection Time: 07/03/20  5:22 PM  Result Value Ref Range   Sodium 133 (L) 135 - 145 mmol/L   Potassium 3.2 (L) 3.5 - 5.1 mmol/L   Chloride 104 98 - 111 mmol/L   CO2 15 (L) 22 - 32 mmol/L   Glucose, Bld 110 (H) 70 - 99 mg/dL   BUN 33 (H) 6 - 20 mg/dL   Creatinine, Ser 5.39 (H) 0.44 - 1.00 mg/dL   Calcium 10.5 (H) 8.9 - 10.3 mg/dL   Total Protein 8.6 (H) 6.5 - 8.1 g/dL   Albumin 3.6 3.5 - 5.0 g/dL   AST 31 15 - 41 U/L   ALT 21 0 - 44 U/L   Alkaline Phosphatase 87 38 - 126 U/L   Total Bilirubin 0.8 0.3 - 1.2 mg/dL   GFR, Estimated 10 (L) >60 mL/min   Anion gap 14 5 - 15     MAU Course  Procedures NST baseline 140s, mod variability. No decels or accels. Reassuring and appropriate for gestational age.  MDM 1L IVF started. Zofran 34m given.  CMP shows acute renal failure.  Admit  Assessment and Plan     ICD-10-CM   1. [redacted] weeks gestation of pregnancy  Z3A.25   2. Elevated serum creatinine  R79.89 [place]    [place]  3. Acute renal failure, unspecified acute renal failure type ([place]  N17.9   4. Intractable vomiting with nausea, unspecified vomiting type  R11.2   5. Hypokalemia  E87.6    Admit Check renal [place] Urine sodium and urine creatinine Consult nephrology - continue IVF and see how improves Cr in AM.   JTanna SavoySPresbyterian St [facility] Center11/15/2021, 7:18 PM

## 2020-07-03 NOTE — MAU Note (Signed)
. °  Cindy Cruz is a 34 y.o. at [redacted]w[redacted]d here in MAU reporting:she has been having vomiting for three days. Denies any VB. Reports contractions on and off but denies any now  Onset of complaint: 3 days Pain score: 0 Vitals:   07/03/20 1738 07/03/20 1739  BP:  121/87  Pulse:  97  Resp:  16  Temp:  97.7 F (36.5 C)  SpO2: 99%      FHT:150  Lab orders placed from triage: UA

## 2020-07-03 NOTE — MAU Provider Note (Signed)
History    Chief Complaint  Patient presents with  . Emesis   HPI: Patient is a [id number] at 25 weeks 4 days presenting with 3 days of emesis that is constant. Patient cannot keep down fluids or food. She tried drinking ginger ale and eating soup, chicken, and salad, but threw it all up. Vomiting is stomach contents only. Sleep relieves nausea. Patient denies dysuria, increased frequency of urination, burning. Patient reports symptoms of heartburn and wants refill of medication that helped. Patient does not remember name of medication.   OB History    Gravida  8   Para  6   Term  6   Preterm  0   AB  1   Living  6     SAB  0   TAB  0   Ectopic  0   Multiple  0   Live Births  6           Past Medical History:  Diagnosis Date  . 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 Depression    ok now  . GERD (gastroesophageal reflux disease)    indigestion with pregnancy only; Rx med helps; pt cannot remember name of med  . Trichomoniasis     Past Surgical History:  Procedure Laterality Date  . WISDOM TOOTH EXTRACTION      Family History  Problem Relation Age of Onset  . Hypertension Mother   . Cancer Maternal Aunt        breast  . Hearing loss Neg Hx     Social History   Tobacco Use  . Smoking status: Light Tobacco Smoker    Packs/day: 0.25    Years: 10.00    Pack years: 2.50    Types: Cigarettes  . Smokeless tobacco: Never Used  Vaping Use  . Vaping Use: Never used  Substance Use Topics  . Alcohol use: Not Currently    Comment: socially on occ.-liquor  . Drug use: No    Allergies: No Known Allergies  Medications Prior to Admission  Medication Sig Dispense Refill Last Dose  . Blood Pressure Monitoring (BLOOD PRESSURE KIT) DEVI 1 kit by Does not apply route as needed. Large cuff 1 each 0 07/03/2020 at Unknown time  . Elastic Bandages & Supports (COMFORT FIT MATERNITY SUPP LG) MISC 1 application by Does not apply route daily. 1  each 0 07/03/2020 at Unknown time  . Prenatal Vit-Fe Fumarate-FA (MULTIVITAMIN-PRENATAL) 27-0.8 MG TABS tablet Take 1 tablet by mouth daily at 12 noon.   07/03/2020 at Unknown time  . calcium carbonate (TUMS - DOSED IN MG ELEMENTAL CALCIUM) 500 MG chewable tablet Chew 4 tablets by mouth daily. (Patient not taking: Reported on 03/20/2020)     . metroNIDAZOLE (FLAGYL) 500 MG tablet Take 1 tablet (500 mg total) by mouth 2 (two) times daily. 14 tablet 0   . omeprazole (PRILOSEC) 20 MG capsule Take 1 capsule (20 mg total) by mouth 2 (two) times daily before a meal. 60 capsule 5   . terconazole (TERAZOL 7) 0.4 % vaginal cream Place 1 applicator vaginally at bedtime. 45 g 0 not taking    Review of Systems  Constitutional: Positive for appetite change and fatigue. Negative for chills and fever.  Respiratory: Negative for cough.   Gastrointestinal: Positive for nausea and vomiting. Negative for abdominal pain.  Endocrine: Negative for polyuria.  Genitourinary: Positive for flank pain. Negative for difficulty urinating, dysuria, frequency and vaginal bleeding.  Musculoskeletal:  Positive for back pain.  Psychiatric/Behavioral: Negative for confusion.   Physical Exam Blood pressure 121/87, pulse 97, temperature 97.7 F (36.5 C), resp. rate 16, last menstrual period 12/30/2019, SpO2 100 %, not currently breastfeeding. Physical Exam Constitutional:      Appearance: Normal appearance.  Cardiovascular:     Rate and Rhythm: Normal rate.  Pulmonary:     Effort: Pulmonary effort is normal.  Abdominal:     Palpations: Abdomen is soft.     Tenderness: There is no abdominal tenderness. There is no guarding or rebound.     Hernia: No hernia is present.  Musculoskeletal:        General: Tenderness present.  Skin:    General: Skin is warm and dry.  Neurological:     Mental Status: She is alert.  Psychiatric:        Mood and Affect: Mood normal.        Behavior: Behavior normal.     MAU  Course Procedures  - Obtain CBC, CMP, UA. Pending results - 8 mg Zofran administered to patient - 1000 mg IV bolus of lactate ringers administered  Assessment:  - Second trimester pregnancy monitoring - Emesis - Dyspepsia  Plan:  - Pending results for CBC, CMP, and UA.  - Continue Zofran and lactated ringers IV.

## 2020-07-03 NOTE — Progress Notes (Signed)
Patient c/o burning at IV site from Potassium run.  IV rate decreased to 50cc/h, patient stated "take it out, I can't stand it.  IV Potassium stopped, Dr Alysia Penna notified, orders received and carried.

## 2020-07-04 DIAGNOSIS — O211 Hyperemesis gravidarum with metabolic disturbance: Secondary | ICD-10-CM | POA: Diagnosis not present

## 2020-07-04 DIAGNOSIS — Z148 Genetic carrier of other disease: Secondary | ICD-10-CM

## 2020-07-04 DIAGNOSIS — E876 Hypokalemia: Secondary | ICD-10-CM | POA: Diagnosis not present

## 2020-07-04 DIAGNOSIS — N179 Acute kidney failure, unspecified: Secondary | ICD-10-CM | POA: Diagnosis not present

## 2020-07-04 DIAGNOSIS — O99891 Other specified diseases and conditions complicating pregnancy: Secondary | ICD-10-CM | POA: Diagnosis not present

## 2020-07-04 LAB — URINALYSIS, ROUTINE W REFLEX MICROSCOPIC
Bilirubin Urine: NEGATIVE
Glucose, UA: NEGATIVE mg/dL
Ketones, ur: NEGATIVE mg/dL
Nitrite: NEGATIVE
Protein, ur: 30 mg/dL — AB
Specific Gravity, Urine: 1.008 (ref 1.005–1.030)
pH: 5 (ref 5.0–8.0)

## 2020-07-04 LAB — CREATININE, URINE, RANDOM: Creatinine, Urine: 398 mg/dL

## 2020-07-04 LAB — COMPREHENSIVE METABOLIC PANEL
ALT: 16 U/L (ref 0–44)
AST: 23 U/L (ref 15–41)
Albumin: 2.6 g/dL — ABNORMAL LOW (ref 3.5–5.0)
Alkaline Phosphatase: 57 U/L (ref 38–126)
Anion gap: 8 (ref 5–15)
BUN: 36 mg/dL — ABNORMAL HIGH (ref 6–20)
CO2: 17 mmol/L — ABNORMAL LOW (ref 22–32)
Calcium: 9.5 mg/dL (ref 8.9–10.3)
Chloride: 109 mmol/L (ref 98–111)
Creatinine, Ser: 5.07 mg/dL — ABNORMAL HIGH (ref 0.44–1.00)
GFR, Estimated: 11 mL/min — ABNORMAL LOW (ref >60)
Glucose, Bld: 82 mg/dL (ref 70–99)
Potassium: 3.5 mmol/L (ref 3.5–5.1)
Sodium: 134 mmol/L — ABNORMAL LOW (ref 135–145)
Total Bilirubin: 0.7 mg/dL (ref 0.3–1.2)
Total Protein: 6.2 g/dL — ABNORMAL LOW (ref 6.5–8.1)

## 2020-07-04 LAB — WET PREP, GENITAL
Clue Cells Wet Prep HPF POC: NONE SEEN
Sperm: NONE SEEN
Trich, Wet Prep: NONE SEEN

## 2020-07-04 LAB — SODIUM, URINE, RANDOM: Sodium, Ur: <10 mmol/L

## 2020-07-04 NOTE — Progress Notes (Signed)
[facility] ROUNDING NOTE   Subjective:   Brief history: 33-week pregnant lady admitted with nausea vomiting inability to tolerate solids.  Presented with an elevated creatinine 5.39.  Renal imaging showed normal-sized kidneys no evidence of hydronephrosis or obstructive pattern.  It appears her baseline creatinine is in the normal range.  No recorded urine output.   Blood pressure 106/62 pulse 84 temperature 97.8   Sodium 135 potassium 3.5 chloride 109 CO2 17 glucose 82 BUN 36 creatinine 5.07 albumin 2.6.  His last urinalysis was essentially unremarkable on 05/22/2020.  I do not see another urinalysis from this admission  Objective:  Vital signs in last 24 hours:  Temp:  [97.7 F (36.5 C)-98.2 F (36.8 C)] 97.8 F (36.6 C) (11/16 0754) Pulse Rate:  [84-97] 84 (11/16 0754) Resp:  [16-20] 18 (11/16 0754) BP: (106-129)/(62-87) 106/62 (11/16 0754) SpO2:  [99 %-100 %] 100 % (11/16 0754) Weight:  [84.6 kg] 84.6 kg (11/15 2003)  Weight change:  Filed Weights   07/03/20 2003  Weight: 84.6 kg    Intake/Output: I/O last 3 completed shifts: In: 1884.3 [P.O.:660; I.V.:1190.5; IV Piggyback:33.8] Out: 0    Intake/Output this shift:  No intake/output data recorded. Nondistressed CVS- RRR no murmurs rubs or gallops RS- CTA ABD-gravid uterus EXT- no edema   Basic Metabolic Panel: Recent Labs  Lab 07/03/20 1722 07/04/20 0612  NA 133* 134*  K 3.2* 3.5  CL 104 109  CO2 15* 17*  GLUCOSE 110* 82  BUN 33* 36*  CREATININE 5.39* 5.07*  CALCIUM 10.5* 9.5    Liver Function Tests: Recent Labs  Lab 07/03/20 1722 07/04/20 0612  AST 31 23  ALT 21 16  ALKPHOS 87 57  BILITOT 0.8 0.7  PROT 8.6* 6.2*  ALBUMIN 3.6 2.6*   No results for input(s): LIPASE, AMYLASE in the last 168 hours. No results for input(s): AMMONIA in the last 168 hours.  CBC: Recent Labs  Lab 07/03/20 1722  WBC 7.8  HGB 12.6  HCT 39.2  MCV 85.6  PLT 408*    Cardiac Enzymes: Recent  Labs  Lab 07/03/20 1722  CKTOTAL 59    BNP: Invalid input(s): POCBNP  CBG: No results for input(s): GLUCAP in the last 168 hours.  Microbiology: Results for orders placed or performed during the hospital encounter of 07/03/20  Respiratory Panel by RT PCR (Flu A&B, Covid) - Nasopharyngeal Swab     Status: None   Collection Time: 07/03/20  7:28 PM   Specimen: Nasopharyngeal Swab  Result Value Ref Range Status   SARS Coronavirus 2 by RT PCR NEGATIVE NEGATIVE Final    Comment: (NOTE) SARS-CoV-2 target nucleic acids are NOT DETECTED.  The SARS-CoV-2 RNA is generally detectable in upper respiratoy specimens during the acute phase of infection. The lowest concentration of SARS-CoV-2 viral copies this assay can detect is 131 copies/mL. A negative result does not preclude SARS-Cov-2 infection and should not be used as the sole basis for treatment or other patient management decisions. A negative result may occur with  improper specimen collection/handling, submission of specimen other than nasopharyngeal swab, presence of viral mutation(s) within the areas targeted by this assay, and inadequate number of viral copies (<131 copies/mL). A negative result must be combined with clinical observations, patient history, and epidemiological information. The expected result is Negative.  Fact Sheet for Patients:  https://www.moore.com/  Fact Sheet for Healthcare Providers:  https://www.young.biz/  This test is no t yet approved or cleared by the [place] and  has been authorized for detection and/or diagnosis of SARS-CoV-2 by FDA under an Emergency Use Authorization (EUA). This EUA will remain  in effect (meaning this test can be used) for the duration of the COVID-19 declaration under Section 564(b)(1) of the Act, 21 U.S.C. section 360bbb-3(b)(1), unless the authorization is terminated or revoked sooner.     Influenza A by PCR NEGATIVE  NEGATIVE Final   Influenza B by PCR NEGATIVE NEGATIVE Final    Comment: (NOTE) The Xpert Xpress SARS-CoV-2/FLU/RSV assay is intended as an aid in  the diagnosis of influenza from Nasopharyngeal swab specimens and  should not be used as a sole basis for treatment. Nasal washings and  aspirates are unacceptable for Xpert Xpress SARS-CoV-2/FLU/RSV  testing.  Fact Sheet for Patients: https://www.moore.com/  Fact Sheet for Healthcare Providers: https://www.young.biz/  This test is not yet approved or cleared by the [place] FDA and  has been authorized for detection and/or diagnosis of SARS-CoV-2 by  FDA under an Emergency Use Authorization (EUA). This EUA will remain  in effect (meaning this test can be used) for the duration of the  Covid-19 declaration under Section 564(b)(1) of the Act, 21  U.S.C. section 360bbb-3(b)(1), unless the authorization is  terminated or revoked. Performed at [facility], [address], [place], [place] [address]     Coagulation Studies: No results for input(s): LABPROT, INR in the last 72 hours.  Urinalysis: No results for input(s): COLORURINE, LABSPEC, PHURINE, GLUCOSEU, HGBUR, BILIRUBINUR, KETONESUR, PROTEINUR, UROBILINOGEN, NITRITE, LEUKOCYTESUR in the last 72 hours.  Invalid input(s): APPERANCEUR    Imaging: US RENAL  Result Date: 07/03/2020 CLINICAL DATA:  34 year old pregnant female with elevated creatinine and emesis. EXAM: RENAL / URINARY TRACT ULTRASOUND COMPLETE COMPARISON:  None. FINDINGS: Right Kidney: Renal measurements: 9.2 x 5.9 x 5.5 cm = volume: 147 mL. Echogenicity within normal limits. No mass or hydronephrosis visualized. Left Kidney: Renal measurements: 8.9 x 5.7 x 5.4 cm = volume: 142 mL. Echogenicity within normal limits. No mass or hydronephrosis visualized. Bladder: The urinary bladder is decompressed and not visualized. Other: Partially visualized fetal head. IMPRESSION: No  hydronephrosis or shadowing stone. Electronically Signed   By: Elgie Collard M.D.   On: 07/03/2020 20:10     Medications:   . lactated ringers with kcl 125 mL/hr at 07/04/20 0555   . docusate sodium  100 mg Oral Daily  . pantoprazole  40 mg Oral Daily  . potassium chloride  20 mEq Oral BID  . prenatal multivitamin  1 tablet Oral Q1200   acetaminophen, ondansetron (ZOFRAN) IV, zolpidem  Assessment/ Plan:   Acute kidney injury of unclear etiology.  There is no urinalysis this will be key to helping assist with the diagnosis.  This does not look like preeclampsia with normal blood pressures no evidence of edema.  There is no evidence of hydronephrosis or obstruction.  She is continuing on hydration.  This could be dehydration or acute tubular necrosis.  Would recommend continuing IV fluids and hydration at this point  Volume.  Does not appear to have volume overload.  Hyperemesis with poor p.o. intake.  Nausea vomiting improving significantly.  Good fetal movements with no evidence of uterine contractions appreciate assistance from OB/GYN   LOS: 1 Garnetta Buddy @TODAY @8 :38 AM

## 2020-07-04 NOTE — Progress Notes (Signed)
Patient ID: Cindy Cruz, female   DOB: August 15, 1986, 34 y.o.   MRN: [id number] ACULTY PRACTICE ANTEPARTUM COMPREHENSIVE PROGRESS NOTE  Cindy Cruz is a 34 y.o. [id number] at [redacted]w[redacted]d  who is admitted for N/V, hypokalemia and acute renal failure.   Fetal presentation is unsure. Length of Stay:  1  Days  Subjective: Pt reports feeling better this morning. Able to tolerate liquids. No more N/V since admission. Patient reports good fetal movement.  She reports no uterine contractions, no bleeding and no loss of fluid per vagina.  Vitals:  Blood pressure 106/62, pulse 84, temperature 97.8 F (36.6 C), temperature source Oral, resp. rate 18, height 5\' 5"  (1.651 m), weight 84.6 kg, last menstrual period 12/30/2019, SpO2 100 %, not currently breastfeeding.   Physical Examination: Lungs clear Heart RRR Abd soft + BS gravid non tender Ext non tender  Fetal Monitoring:  Baseline: 130's bpm  Labs:  Results for orders placed or performed during the hospital encounter of 07/03/20 (from the past 24 hour(s))  CBC   Collection Time: 07/03/20  5:22 PM  Result Value Ref Range   WBC 7.8 4.0 - 10.5 K/uL   RBC 4.58 3.87 - 5.11 MIL/uL   Hemoglobin 12.6 12.0 - 15.0 g/dL   HCT 07/05/20 36 - 46 %   MCV 85.6 80.0 - 100.0 fL   MCH 27.5 26.0 - 34.0 pg   MCHC 32.1 30.0 - 36.0 g/dL   RDW 58.7 27.6 - 18.4 %   Platelets 408 (H) 150 - 400 K/uL   nRBC 0.0 0.0 - 0.2 %  Comprehensive metabolic panel   Collection Time: 07/03/20  5:22 PM  Result Value Ref Range   Sodium 133 (L) 135 - 145 mmol/L   Potassium 3.2 (L) 3.5 - 5.1 mmol/L   Chloride 104 98 - 111 mmol/L   CO2 15 (L) 22 - 32 mmol/L   Glucose, Bld 110 (H) 70 - 99 mg/dL   BUN 33 (H) 6 - 20 mg/dL   Creatinine, Ser 07/05/20 (H) 0.44 - 1.00 mg/dL   Calcium 2.76 (H) 8.9 - 10.3 mg/dL   Total Protein 8.6 (H) 6.5 - 8.1 g/dL   Albumin 3.6 3.5 - 5.0 g/dL   AST 31 15 - 41 U/L   ALT 21 0 - 44 U/L   Alkaline Phosphatase 87 38 - 126 U/L   Total Bilirubin 0.8 0.3 - 1.2  mg/dL   GFR, Estimated 10 (L) >60 mL/min   Anion gap 14 5 - 15  CK   Collection Time: 07/03/20  5:22 PM  Result Value Ref Range   Total CK 59 38.0 - 234.0 U/L  Respiratory Panel by RT PCR (Flu A&B, Covid) - Nasopharyngeal Swab   Collection Time: 07/03/20  7:28 PM   Specimen: Nasopharyngeal Swab  Result Value Ref Range   SARS Coronavirus 2 by RT PCR NEGATIVE NEGATIVE   Influenza A by PCR NEGATIVE NEGATIVE   Influenza B by PCR NEGATIVE NEGATIVE  Type and screen [facility]   Collection Time: 07/03/20  8:52 PM  Result Value Ref Range   ABO/RH(D) O POS    Antibody Screen NEG    Sample Expiration      07/06/2020,2359 Performed at [facility], [address], [place], [place]   Comprehensive metabolic panel   Collection Time: 07/04/20  6:12 AM  Result Value Ref Range   Sodium 134 (L) 135 - 145 mmol/L   Potassium 3.5 3.5 -  5.1 mmol/L   Chloride 109 98 - 111 mmol/L   CO2 17 (L) 22 - 32 mmol/L   Glucose, Bld 82 70 - 99 mg/dL   BUN 36 (H) 6 - 20 mg/dL   Creatinine, Ser 5.36 (H) 0.44 - 1.00 mg/dL   Calcium 9.5 8.9 - 64.4 mg/dL   Total Protein 6.2 (L) 6.5 - 8.1 g/dL   Albumin 2.6 (L) 3.5 - 5.0 g/dL   AST 23 15 - 41 U/L   ALT 16 0 - 44 U/L   Alkaline Phosphatase 57 38 - 126 U/L   Total Bilirubin 0.7 0.3 - 1.2 mg/dL   GFR, Estimated 11 (L) >60 mL/min   Anion gap 8 5 - 15    Imaging Studies:    NA   Medications:  Scheduled . docusate sodium  100 mg Oral Daily  . pantoprazole  40 mg Oral Daily  . potassium chloride  20 mEq Oral BID  . prenatal multivitamin  1 tablet Oral Q1200   I have reviewed the patient's current medications.  ASSESSMENT: IUP 25 2/7 weeks N/V Hypokalemia Acute renal failure   PLAN: N/V improving. Potassium corrected. Renal function no significant change. Appreciate Nephrology's assistance Continue routine antenatal care.   Hermina Staggers 07/04/2020,8:19 AM

## 2020-07-05 DIAGNOSIS — O219 Vomiting of pregnancy, unspecified: Secondary | ICD-10-CM

## 2020-07-05 DIAGNOSIS — O99891 Other specified diseases and conditions complicating pregnancy: Secondary | ICD-10-CM | POA: Diagnosis not present

## 2020-07-05 DIAGNOSIS — N179 Acute kidney failure, unspecified: Secondary | ICD-10-CM | POA: Diagnosis not present

## 2020-07-05 DIAGNOSIS — Z3A25 25 weeks gestation of pregnancy: Secondary | ICD-10-CM | POA: Diagnosis not present

## 2020-07-05 DIAGNOSIS — B3731 Acute candidiasis of vulva and vagina: Secondary | ICD-10-CM | POA: Diagnosis present

## 2020-07-05 DIAGNOSIS — B373 Candidiasis of vulva and vagina: Secondary | ICD-10-CM | POA: Diagnosis present

## 2020-07-05 LAB — COMPREHENSIVE METABOLIC PANEL WITH GFR
ALT: 17 U/L (ref 0–44)
AST: 24 U/L (ref 15–41)
Albumin: 2.1 g/dL — ABNORMAL LOW (ref 3.5–5.0)
Alkaline Phosphatase: 70 U/L (ref 38–126)
Anion gap: 4 — ABNORMAL LOW (ref 5–15)
BUN: 35 mg/dL — ABNORMAL HIGH (ref 6–20)
CO2: 16 mmol/L — ABNORMAL LOW (ref 22–32)
Calcium: 9.6 mg/dL (ref 8.9–10.3)
Chloride: 114 mmol/L — ABNORMAL HIGH (ref 98–111)
Creatinine, Ser: 2.96 mg/dL — ABNORMAL HIGH (ref 0.44–1.00)
GFR, Estimated: 21 mL/min — ABNORMAL LOW
Glucose, Bld: 82 mg/dL (ref 70–99)
Potassium: 3.8 mmol/L (ref 3.5–5.1)
Sodium: 134 mmol/L — ABNORMAL LOW (ref 135–145)
Total Bilirubin: 0.3 mg/dL (ref 0.3–1.2)
Total Protein: 5.3 g/dL — ABNORMAL LOW (ref 6.5–8.1)

## 2020-07-05 MED ORDER — LACTATED RINGERS IV SOLN
INTRAVENOUS | Status: DC
  Administered 2020-07-06: 75 mL/h via INTRAVENOUS

## 2020-07-05 MED ORDER — CLOTRIMAZOLE 1 % VA CREA
1.0000 | TOPICAL_CREAM | Freq: Every day | VAGINAL | Status: DC
  Administered 2020-07-05 – 2020-07-06 (×2): 1 via VAGINAL
  Filled 2020-07-05: qty 45

## 2020-07-05 MED ORDER — DIPHENHYDRAMINE HCL 25 MG PO CAPS
25.0000 mg | ORAL_CAPSULE | ORAL | Status: DC | PRN
  Administered 2020-07-05 – 2020-07-06 (×4): 25 mg via ORAL
  Filled 2020-07-05 (×4): qty 1

## 2020-07-05 NOTE — Progress Notes (Addendum)
Daily Antepartum Note  Admission Date: 07/03/2020 Current Date: 07/05/2020 9:05 AM  Cindy Cruz is a 34 y.o. [id number] @ [redacted]w[redacted]d, HD#3, admitted for n/v with AKI noted on work up.  Pregnancy complicated by: Patient Active Problem List   Diagnosis Date Noted  . Vulvovaginal candidiasis 07/05/2020  . 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 Acute renal failure (ARF) (HCC) 07/03/2020  . Vaginal itching 06/14/2020  . Supervision of other normal pregnancy, antepartum 03/20/2020  . Grand multipara 03/31/2018    Overnight/24hr events:  none  Subjective:  Doing well with PO. No OB complaints  Objective:    Current Vital Signs 24h Vital Sign Ranges  T 97.8 F (36.6 C) Temp  Avg: 98 F (36.7 C)  Min: 97.8 F (36.6 C)  Max: 98.5 F (36.9 C)  BP 126/73 BP  Min: 107/63  Max: 126/73  HR 93 Pulse  Avg: 89.2  Min: 85  Max: 93  RR 18 Resp  Avg: 17.7  Min: 16  Max: 18  SaO2 100 %  (Room Air) SpO2  Avg: 100 %  Min: 100 %  Max: 100 %       24 Hour I/O Current Shift I/O  Time Ins Outs 11/16 0701 - 11/17 0700 In: 4019.5 [P.O.:1320; I.V.:2699.5] Out: 1650 [Urine:1650] 11/17 0701 - 11/17 1900 In: 0  Out: 900 [Urine:900]   Patient Vitals for the past 24 hrs:  BP Temp Temp src Pulse Resp SpO2  07/05/20 0824 126/73 97.8 F (36.6 C) Oral 93 18 100 %  07/05/20 0610 -- -- -- -- -- 100 %  07/05/20 0609 114/63 97.9 F (36.6 C) Oral 87 18 100 %  07/04/20 2321 (!) 114/47 97.9 F (36.6 C) Oral 89 18 100 %  07/04/20 1943 107/63 98.5 F (36.9 C) Oral 92 16 100 %  07/04/20 1501 113/70 98.1 F (36.7 C) Oral 85 18 100 %  07/04/20 1500 -- -- -- -- -- 100 %  07/04/20 1118 114/66 98 F (36.7 C) Oral 89 18 100 %   FHT: 135 baseline, ?accels, no decel, mod variability Toco: quiet x 66m  Physical exam: General: Well nourished, well developed female in no acute distress. Abdomen: gravid nttp Cardiovascular: S1, S2 normal, no murmur, rub or gallop, regular rate and  rhythm Respiratory: CTAB Extremities: no clubbing, cyanosis or edema Skin: Warm and dry.   Medications: Current Facility-Administered Medications  Medication Dose Route Frequency Provider Last Rate Last Admin  . acetaminophen (TYLENOL) tablet 650 mg  650 mg Oral Q4H PRN Levie Heritage, DO   650 mg at 07/04/20 2206  . docusate sodium (COLACE) capsule 100 mg  100 mg Oral Daily Levie Heritage, DO   100 mg at 07/04/20 1005  . lactated ringers 1,000 mL with potassium chloride 20 mEq infusion   Intravenous Continuous Hermina Staggers, MD 125 mL/hr at 07/05/20 0405 Rate Verify at 07/05/20 0405  . ondansetron (ZOFRAN) injection 4 mg  4 mg Intravenous Q6H PRN Levie Heritage, DO      . pantoprazole (PROTONIX) EC tablet 40 mg  40 mg Oral Daily Levie Heritage, DO   40 mg at 07/04/20 1005  . prenatal multivitamin tablet 1 tablet  1 tablet Oral Q1200 Levie Heritage, DO   1 tablet at 07/04/20 1155  . zolpidem (AMBIEN) tablet 5 mg  5 mg Oral QHS PRN Levie Heritage, DO   5 mg at 07/04/20 2323    Labs:  Recent Labs  Lab 07/03/20 1722  WBC 7.8  HGB 12.6  HCT 39.2  PLT 408*    Recent Labs  Lab 07/03/20 1722 07/04/20 0612 07/05/20 0530  NA 133* 134* 134*  K 3.2* 3.5 3.8  CL 104 109 114*  CO2 15* 17* 16*  BUN 33* 36* 35*  CREATININE 5.39* 5.07* 2.96*  CALCIUM 10.5* 9.5 9.6  PROT 8.6* 6.2* 5.3*  BILITOT 0.8 0.7 0.3  ALKPHOS 87 57 70  ALT 21 16 17   AST 31 23 24   GLUCOSE 110* 82 82   Wet prep: +yeast GC/CT: pending  Urine culture: pending  Results for Cindy, Cruz (MRN ) as of 07/05/2020 10:13  Ref. Range 07/04/2020 10:10 07/04/2020 21:27  URINALYSIS, ROUTINE W REFLEX MICROSCOPIC Unknown  Rpt (A)  Appearance Latest Ref Range: CLEAR   CLOUDY (A)  Bilirubin Urine Latest Ref Range: NEGATIVE   NEGATIVE  Color, Urine Latest Ref Range: YELLOW   YELLOW  Glucose, UA Latest Ref Range: NEGATIVE mg/dL  NEGATIVE  Hgb urine dipstick Latest Ref Range: NEGATIVE   SMALL (A)   Ketones, ur Latest Ref Range: NEGATIVE mg/dL  NEGATIVE  Leukocytes,Ua Latest Ref Range: NEGATIVE   MODERATE (A)  Nitrite Latest Ref Range: NEGATIVE   NEGATIVE  pH Latest Ref Range: 5.0 - 8.0   5.0  Protein Latest Ref Range: NEGATIVE mg/dL  30 (A)  Specific Gravity, Urine Latest Ref Range: 1.005 - 1.030   1.008  Bacteria, UA Latest Ref Range: NONE SEEN   MANY (A)  Budding Yeast Unknown  PRESENT  Mucus Unknown  PRESENT  RBC / HPF Latest Ref Range: 0 - 5 RBC/hpf  6-10  Squamous Epithelial / LPF Latest Ref Range: 0 - 5   11-20  WBC, UA Latest Ref Range: 0 - 5 WBC/hpf  11-20  Sodium, Urine Latest Units: mmol/L <10   Creatinine, Urine Latest Units: mg/dL 07/06/2020     Radiology:  No new imaging  Assessment & Plan:  Pt improving *Pregnancy: reactive NST. qday NST. monstat 7 ordered *Renal: improving and UOP improving. qday BMP. Nephrology following and appreciate recs *Preterm: no current issues *PPx: SCDs, OOB ad lib *FEN/GI: will ask nephro about continuing MIVF. Regular diet *Dispo: once Cr normal.   07/06/2020 MD Attending Center for [facility]) GYN Consult Phone: [phone] (M-F, 0800-1700) & [phone] (Off hours, weekends, holidays)

## 2020-07-05 NOTE — Progress Notes (Addendum)
[facility] ROUNDING NOTE   Subjective:   Brief history: 25-week pregnant lady admitted with nausea vomiting inability to tolerate solids.  Presented with an elevated creatinine 5.39.  Renal imaging showed normal-sized kidneys no evidence of hydronephrosis or obstructive pattern.  It appears her baseline creatinine is in the normal range.  Urine output 900 cc 07/04/2020 + fluid balance 3,69 7.4 L   Blood pressure 114/47 pulse 89 temperature 97.9  Urinalysis 30 mg/dL protein moderate leukocytes small blood RBCs WBCs bacteria in urine Urine sodium less than 10   Sodium 134 potassium 3.8 chloride 114 CO2 16 BUN 35 creatinine 2.96 calcium 9.6  Objective:  Vital signs in last 24 hours:  Temp:  [97.8 F (36.6 C)-98.5 F (36.9 C)] 97.9 F (36.6 C) (11/17 0609) Pulse Rate:  [84-92] 87 (11/17 0609) Resp:  [16-18] 18 (11/17 0609) BP: (106-114)/(47-70) 114/63 (11/17 0609) SpO2:  [100 %] 100 % (11/17 0610)  Weight change:  Filed Weights   07/03/20 2003  Weight: 84.6 kg    Intake/Output: I/O last 3 completed shifts: In: 3952.3 [P.O.:1620; I.V.:2298.5; IV Piggyback:33.8] Out: 500 [Urine:500]   Intake/Output this shift:  Total I/O In: 1951.5 [P.O.:360; I.V.:1591.5] Out: 1150 [Urine:1150]    Nondistressed CVS- RRR no murmurs rubs or gallops RS- CTA ABD-gravid uterus EXT- no edema   Basic Metabolic Panel: Recent Labs  Lab 07/03/20 1722 07/04/20 0612  NA 133* 134*  K 3.2* 3.5  CL 104 109  CO2 15* 17*  GLUCOSE 110* 82  BUN 33* 36*  CREATININE 5.39* 5.07*  CALCIUM 10.5* 9.5    Liver Function Tests: Recent Labs  Lab 07/03/20 1722 07/04/20 0612  AST 31 23  ALT 21 16  ALKPHOS 87 57  BILITOT 0.8 0.7  PROT 8.6* 6.2*  ALBUMIN 3.6 2.6*   No results for input(s): LIPASE, AMYLASE in the last 168 hours. No results for input(s): AMMONIA in the last 168 hours.  CBC: Recent Labs  Lab 07/03/20 1722  WBC 7.8  HGB 12.6  HCT 39.2  MCV 85.6  PLT  408*    Cardiac Enzymes: Recent Labs  Lab 07/03/20 1722  CKTOTAL 59    BNP: Invalid input(s): POCBNP  CBG: No results for input(s): GLUCAP in the last 168 hours.  Microbiology: Results for orders placed or performed during the hospital encounter of 07/03/20  Respiratory Panel by RT PCR (Flu A&B, Covid) - Nasopharyngeal Swab     Status: None   Collection Time: 07/03/20  7:28 PM   Specimen: Nasopharyngeal Swab  Result Value Ref Range Status   SARS Coronavirus 2 by RT PCR NEGATIVE NEGATIVE Final    Comment: (NOTE) SARS-CoV-2 target nucleic acids are NOT DETECTED.  The SARS-CoV-2 RNA is generally detectable in upper respiratoy specimens during the acute phase of infection. The lowest concentration of SARS-CoV-2 viral copies this assay can detect is 131 copies/mL. A negative result does not preclude SARS-Cov-2 infection and should not be used as the sole basis for treatment or other patient management decisions. A negative result may occur with  improper specimen collection/handling, submission of specimen other than nasopharyngeal swab, presence of viral mutation(s) within the areas targeted by this assay, and inadequate number of viral copies (<131 copies/mL). A negative result must be combined with clinical observations, patient history, and epidemiological information. The expected result is Negative.  Fact Sheet for Patients:  https://www.moore.com/  Fact Sheet for Healthcare Providers:  https://www.young.biz/  This test is no t yet approved or cleared by the  [place] [employer] and  has been authorized for detection and/or diagnosis of SARS-CoV-2 by FDA under an TEFL teacher (EUA). This EUA will remain  in effect (meaning this test can be used) for the duration of the COVID-19 declaration under Section 564(b)(1) of the Act, 21 U.S.C. section 360bbb-3(b)(1), unless the authorization is terminated or revoked  sooner.     Influenza A by PCR NEGATIVE NEGATIVE Final   Influenza B by PCR NEGATIVE NEGATIVE Final    Comment: (NOTE) The Xpert Xpress SARS-CoV-2/FLU/RSV assay is intended as an aid in  the diagnosis of influenza from Nasopharyngeal swab specimens and  should not be used as a sole basis for treatment. Nasal washings and  aspirates are unacceptable for Xpert Xpress SARS-CoV-2/FLU/RSV  testing.  Fact Sheet for Patients: https://www.moore.com/  Fact Sheet for Healthcare Providers: https://www.young.biz/  This test is not yet approved or cleared by the [place] FDA and  has been authorized for detection and/or diagnosis of SARS-CoV-2 by  FDA under an Emergency Use Authorization (EUA). This EUA will remain  in effect (meaning this test can be used) for the duration of the  Covid-19 declaration under Section 564(b)(1) of the Act, 21  U.S.C. section 360bbb-3(b)(1), unless the authorization is  terminated or revoked. Performed at [facility], [address], [place], [place] [address]   Wet prep, genital     Status: Abnormal   Collection Time: 07/04/20 10:27 PM  Result Value Ref Range Status   Yeast Wet Prep HPF POC PRESENT (A) NONE SEEN Final   Trich, Wet Prep NONE SEEN NONE SEEN Final   Clue Cells Wet Prep HPF POC NONE SEEN NONE SEEN Final   WBC, Wet Prep HPF POC MANY (A) NONE SEEN Final   Sperm NONE SEEN  Final    Comment: Performed at [facility], [address], [place], [place] [address]    Coagulation Studies: No results for input(s): LABPROT, INR in the last 72 hours.  Urinalysis: Recent Labs    07/04/20 2127  COLORURINE YELLOW  LABSPEC 1.008  PHURINE 5.0  GLUCOSEU NEGATIVE  HGBUR SMALL*  BILIRUBINUR NEGATIVE  KETONESUR NEGATIVE  PROTEINUR 30*  NITRITE NEGATIVE  LEUKOCYTESUR MODERATE*      Imaging: US RENAL  Result Date: 07/03/2020 CLINICAL DATA:  34 year old pregnant female with elevated  creatinine and emesis. EXAM: RENAL / URINARY TRACT ULTRASOUND COMPLETE COMPARISON:  None. FINDINGS: Right Kidney: Renal measurements: 9.2 x 5.9 x 5.5 cm = volume: 147 mL. Echogenicity within normal limits. No mass or hydronephrosis visualized. Left Kidney: Renal measurements: 8.9 x 5.7 x 5.4 cm = volume: 142 mL. Echogenicity within normal limits. No mass or hydronephrosis visualized. Bladder: The urinary bladder is decompressed and not visualized. Other: Partially visualized fetal head. IMPRESSION: No hydronephrosis or shadowing stone. Electronically Signed   By: Elgie Collard M.D.   On: 07/03/2020 20:10     Medications:   . lactated ringers with kcl 125 mL/hr at 07/05/20 0405   . docusate sodium  100 mg Oral Daily  . pantoprazole  40 mg Oral Daily  . prenatal multivitamin  1 tablet Oral Q1200   acetaminophen, ondansetron (ZOFRAN) IV, zolpidem  Assessment/ Plan:   Acute kidney injury of unclear etiology.  Urinalysis appears to be dirty cloudy I favor infection or cystitis.  Will send urine for culture.  This does not look like preeclampsia with normal blood pressures no evidence of edema.  There is no evidence of hydronephrosis or obstruction.  Appears to be volume expanded 3 L positive last 24 hours.  Appears to be resolution of acute kidney injury we will continue to follow.  Would wait for renal function to return to normal prior to discharge  Volume.  Does not appear to have volume overload.  Hyperemesis with poor p.o. intake.  Nausea vomiting improving significantly.  Good fetal movements with no evidence of uterine contractions appreciate assistance from OB/GYN   LOS: 2 Garnetta Buddy @TODAY @6 :25 AM

## 2020-07-06 DIAGNOSIS — Z3A26 26 weeks gestation of pregnancy: Secondary | ICD-10-CM | POA: Diagnosis not present

## 2020-07-06 DIAGNOSIS — O99891 Other specified diseases and conditions complicating pregnancy: Secondary | ICD-10-CM | POA: Diagnosis not present

## 2020-07-06 DIAGNOSIS — O219 Vomiting of pregnancy, unspecified: Secondary | ICD-10-CM | POA: Diagnosis not present

## 2020-07-06 DIAGNOSIS — N179 Acute kidney failure, unspecified: Secondary | ICD-10-CM | POA: Diagnosis not present

## 2020-07-06 LAB — BASIC METABOLIC PANEL
Anion gap: 9 (ref 5–15)
BUN: 24 mg/dL — ABNORMAL HIGH (ref 6–20)
CO2: 18 mmol/L — ABNORMAL LOW (ref 22–32)
Calcium: 9.7 mg/dL (ref 8.9–10.3)
Chloride: 111 mmol/L (ref 98–111)
Creatinine, Ser: 1.54 mg/dL — ABNORMAL HIGH (ref 0.44–1.00)
GFR, Estimated: 45 mL/min — ABNORMAL LOW (ref >60)
Glucose, Bld: 76 mg/dL (ref 70–99)
Potassium: 3.8 mmol/L (ref 3.5–5.1)
Sodium: 138 mmol/L (ref 135–145)

## 2020-07-06 MED ORDER — HYDROCORTISONE 1 % EX CREA
TOPICAL_CREAM | Freq: Two times a day (BID) | CUTANEOUS | Status: DC | PRN
  Filled 2020-07-06: qty 28

## 2020-07-06 NOTE — Progress Notes (Signed)
[facility] ROUNDING NOTE   Subjective:   Brief history: 25-week pregnant lady admitted with nausea vomiting inability to tolerate solids.  Presented with an elevated creatinine 5.39.  Renal imaging showed normal-sized kidneys no evidence of hydronephrosis or obstructive pattern.  It appears her baseline creatinine is in the normal range.  Urine output 3.2 L 07/05/2020   Blood pressure 122/73 pulse 89 temperature 98.1 O2 sats 100%  Urinalysis 30 mg/dL protein moderate leukocytes small blood RBCs WBCs bacteria in urine Urine sodium less than 10   Sodium 138 potassium 3.8 chloride 111 CO2 18 BUN 24 creatinine 1.5 glucose 76 calcium 9.6  Wet prep positive for yeast.  Urine culture mixed growth  Objective:  Vital signs in last 24 hours:  Temp:  [97.8 F (36.6 C)-98.3 F (36.8 C)] 98.1 F (36.7 C) (11/18 0520) Pulse Rate:  [86-97] 89 (11/18 0520) Resp:  [18-19] 18 (11/18 0520) BP: (116-126)/(65-73) 122/73 (11/18 0520) SpO2:  [100 %] 100 % (11/18 0520)  Weight change:  Filed Weights   07/03/20 2003  Weight: 84.6 kg    Intake/Output: I/O last 3 completed shifts: In: 6414.4 [P.O.:2040; I.V.:4374.4] Out: 3750 [Urine:3750]   Intake/Output this shift:  Total I/O In: 1692 [P.O.:710; I.V.:982] Out: 1850 [Urine:1850]    Nondistressed CVS- RRR no murmurs rubs or gallops RS- CTA ABD-gravid uterus EXT- no edema   Basic Metabolic Panel: Recent Labs  Lab 07/03/20 1722 07/03/20 1722 07/04/20 0612 07/05/20 0530 07/06/20 0518  NA 133*  --  134* 134* 138  K 3.2*  --  3.5 3.8 3.8  CL 104  --  109 114* 111  CO2 15*  --  17* 16* 18*  GLUCOSE 110*  --  82 82 76  BUN 33*  --  36* 35* 24*  CREATININE 5.39*  --  5.07* 2.96* 1.54*  CALCIUM 10.5*   < > 9.5 9.6 9.7   < > = values in this interval not displayed.    Liver Function Tests: Recent Labs  Lab 07/03/20 1722 07/04/20 0612 07/05/20 0530  AST 31 23 24   ALT 21 16 17   ALKPHOS 87 57 70  BILITOT 0.8  0.7 0.3  PROT 8.6* 6.2* 5.3*  ALBUMIN 3.6 2.6* 2.1*   No results for input(s): LIPASE, AMYLASE in the last 168 hours. No results for input(s): AMMONIA in the last 168 hours.  CBC: Recent Labs  Lab 07/03/20 1722  WBC 7.8  HGB 12.6  HCT 39.2  MCV 85.6  PLT 408*    Cardiac Enzymes: Recent Labs  Lab 07/03/20 1722  CKTOTAL 59    BNP: Invalid input(s): POCBNP  CBG: No results for input(s): GLUCAP in the last 168 hours.  Microbiology: Results for orders placed or performed during the hospital encounter of 07/03/20  Respiratory Panel by RT PCR (Flu A&B, Covid) - Nasopharyngeal Swab     Status: None   Collection Time: 07/03/20  7:28 PM   Specimen: Nasopharyngeal Swab  Result Value Ref Range Status   SARS Coronavirus 2 by RT PCR NEGATIVE NEGATIVE Final    Comment: (NOTE) SARS-CoV-2 target nucleic acids are NOT DETECTED.  The SARS-CoV-2 RNA is generally detectable in upper respiratoy specimens during the acute phase of infection. The lowest concentration of SARS-CoV-2 viral copies this assay can detect is 131 copies/mL. A negative result does not preclude SARS-Cov-2 infection and should not be used as the sole basis for treatment or other patient management decisions. A negative result may occur with  improper  specimen collection/handling, submission of specimen other than nasopharyngeal swab, presence of viral mutation(s) within the areas targeted by this assay, and inadequate number of viral copies (<131 copies/mL). A negative result must be combined with clinical observations, patient history, and epidemiological information. The expected result is Negative.  Fact Sheet for Patients:  https://www.moore.com/  Fact Sheet for Healthcare Providers:  https://www.young.biz/  This test is no t yet approved or cleared by the [place] FDA and  has been authorized for detection and/or diagnosis of SARS-CoV-2 by FDA under an  Emergency Use Authorization (EUA). This EUA will remain  in effect (meaning this test can be used) for the duration of the COVID-19 declaration under Section 564(b)(1) of the Act, 21 U.S.C. section 360bbb-3(b)(1), unless the authorization is terminated or revoked sooner.     Influenza A by PCR NEGATIVE NEGATIVE Final   Influenza B by PCR NEGATIVE NEGATIVE Final    Comment: (NOTE) The Xpert Xpress SARS-CoV-2/FLU/RSV assay is intended as an aid in  the diagnosis of influenza from Nasopharyngeal swab specimens and  should not be used as a sole basis for treatment. Nasal washings and  aspirates are unacceptable for Xpert Xpress SARS-CoV-2/FLU/RSV  testing.  Fact Sheet for Patients: https://www.moore.com/  Fact Sheet for Healthcare Providers: https://www.young.biz/  This test is not yet approved or cleared by the [place] FDA and  has been authorized for detection and/or diagnosis of SARS-CoV-2 by  FDA under an Emergency Use Authorization (EUA). This EUA will remain  in effect (meaning this test can be used) for the duration of the  Covid-19 declaration under Section 564(b)(1) of the Act, 21  U.S.C. section 360bbb-3(b)(1), unless the authorization is  terminated or revoked. Performed at [facility], [address], [place], [place] [address]   Wet prep, genital     Status: Abnormal   Collection Time: 07/04/20 10:27 PM  Result Value Ref Range Status   Yeast Wet Prep HPF POC PRESENT (A) NONE SEEN Final   Trich, Wet Prep NONE SEEN NONE SEEN Final   Clue Cells Wet Prep HPF POC NONE SEEN NONE SEEN Final   WBC, Wet Prep HPF POC MANY (A) NONE SEEN Final   Sperm NONE SEEN  Final    Comment: Performed at [facility], [address] [place], [place] [address]    Coagulation Studies: No results for input(s): LABPROT, INR in the last 72 hours.  Urinalysis: Recent Labs    07/04/20 2127  COLORURINE YELLOW  LABSPEC 1.008   PHURINE 5.0  GLUCOSEU NEGATIVE  HGBUR SMALL*  BILIRUBINUR NEGATIVE  KETONESUR NEGATIVE  PROTEINUR 30*  NITRITE NEGATIVE  LEUKOCYTESUR MODERATE*      Imaging: No results found.   Medications:   . lactated ringers 125 mL/hr at 07/06/20 0608   . clotrimazole  1 Applicatorful Vaginal QHS  . docusate sodium  100 mg Oral Daily  . pantoprazole  40 mg Oral Daily  . prenatal multivitamin  1 tablet Oral Q1200   acetaminophen, diphenhydrAMINE, ondansetron (ZOFRAN) IV, zolpidem  Assessment/ Plan:   Acute kidney injury of unclear etiology.  Urinalysis appears to be dirty wet prep positive for yeast mixed flora on urine culture.  This does not look like preeclampsia with normal blood pressures no evidence of edema.  There is no evidence of hydronephrosis or obstruction.  Renal function appears to be returning to baseline.  Volume.  Does not appear to have volume overload.  Hyperemesis with poor p.o. intake.  Nausea vomiting  improving significantly.  Good fetal movements with no evidence of uterine contractions appreciate assistance from OB/GYN  Will sign off patient today no need for follow-up needs to maintain p.o. intake.   LOS: 3 Garnetta Buddy @TODAY @6 :30 AM

## 2020-07-06 NOTE — Progress Notes (Signed)
Pt reporting itching, red type rash on both upper legs and abdomen which is unrelieved with benadryl. Dr. Vergie Living notified. New order received. Will continue to monitor. Carmelina Dane, RN

## 2020-07-06 NOTE — Progress Notes (Signed)
Daily Antepartum Note  Admission Date: 07/03/2020 Current Date: 07/06/2020 11:06 AM  Cindy Cruz is a 34 y.o. [id number] @ [redacted]w[redacted]d, HD#4, admitted for n/v with AKI noted on work up.  Pregnancy complicated by: Patient Active Problem List   Diagnosis Date Noted  . Vulvovaginal candidiasis 07/05/2020  . 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 Acute renal failure (ARF) (HCC) 07/03/2020  . Vaginal itching 06/14/2020  . Supervision of other normal pregnancy, antepartum 03/20/2020  . Grand multipara 03/31/2018    Overnight/24hr events:  none  Subjective:  Doing well with PO. No OB complaints  Objective:    Current Vital Signs 24h Vital Sign Ranges  T 98.3 F (36.8 C) Temp  Avg: 98.2 F (36.8 C)  Min: 98.1 F (36.7 C)  Max: 98.3 F (36.8 C)  BP (!) 113/59 BP  Min: 113/59  Max: 122/73  HR 84 Pulse  Avg: 88.8  Min: 84  Max: 97  RR 18 Resp  Avg: 18.4  Min: 18  Max: 19  SaO2 100 % Room Air SpO2  Avg: 100 %  Min: 100 %  Max: 100 %       24 Hour I/O Current Shift I/O  Time Ins Outs 11/17 0701 - 11/18 0700 In: 4086.9 [P.O.:1430; I.V.:2656.9] Out: 3950 [Urine:3950] 11/18 0701 - 11/18 1900 In: -  Out: 800 [Urine:800]   FHT: 135 baseline, +accels, no decel, mod variability Toco: quiet x 32m  Physical exam: General: Well nourished, well developed female in no acute distress. Abdomen: gravid nttp Cardiovascular: S1, S2 normal, no murmur, rub or gallop, regular rate and rhythm Respiratory: CTAB Extremities: no clubbing, cyanosis or edema Skin: Warm and dry.   Medications: Current Facility-Administered Medications  Medication Dose Route Frequency Provider Last Rate Last Admin  . acetaminophen (TYLENOL) tablet 650 mg  650 mg Oral Q4H PRN Levie Heritage, DO   650 mg at 07/06/20 3875  . clotrimazole (GYNE-LOTRIMIN) vaginal cream 1 Applicatorful  1 Applicatorful Vaginal QHS [place] Bing, MD   1 Applicatorful at 07/05/20 2147  . diphenhydrAMINE (BENADRYL) capsule  25 mg  25 mg Oral Q4H PRN Warden Fillers, MD   25 mg at 07/06/20 0606  . docusate sodium (COLACE) capsule 100 mg  100 mg Oral Daily Levie Heritage, DO   100 mg at 07/06/20 6433  . lactated ringers infusion   Intravenous Continuous [place] Bing, MD 75 mL/hr at 07/06/20 0908 Rate Change at 07/06/20 0908  . ondansetron (ZOFRAN) injection 4 mg  4 mg Intravenous Q6H PRN Levie Heritage, DO      . pantoprazole (PROTONIX) EC tablet 40 mg  40 mg Oral Daily Levie Heritage, DO   40 mg at 07/06/20 0959  . prenatal multivitamin tablet 1 tablet  1 tablet Oral Q1200 Levie Heritage, DO   1 tablet at 07/05/20 1203  . zolpidem (AMBIEN) tablet 5 mg  5 mg Oral QHS PRN Levie Heritage, DO   5 mg at 07/05/20 2147    Labs:  Recent Labs  Lab 07/03/20 1722  WBC 7.8  HGB 12.6  HCT 39.2  PLT 408*    Recent Labs  Lab 07/03/20 1722 07/03/20 1722 07/04/20 0612 07/05/20 0530 07/06/20 0518  NA 133*   < > 134* 134* 138  K 3.2*   < > 3.5 3.8 3.8  CL 104   < > 109 114* 111  CO2 15*   < > 17* 16* 18*  BUN 33*   < >  36* 35* 24*  CREATININE 5.39*   < > 5.07* 2.96* 1.54*  CALCIUM 10.5*   < > 9.5 9.6 9.7  PROT 8.6*  --  6.2* 5.3*  --   BILITOT 0.8  --  0.7 0.3  --   ALKPHOS 87  --  57 70  --   ALT 21  --  16 17  --   AST 31  --  23 24  --   GLUCOSE 110*   < > 82 82 76   < > = values in this interval not displayed.   GC/CT: pending  Urine culture: pending  Radiology:  No new imaging  Assessment & Plan:  Pt improving *Pregnancy: reactive NST. qday NST. monstat 7 *Renal: improving and UOP improving. qday BMP. Nephrology signed off. -s/p neg renal u/s *Preterm: no current issues *PPx: SCDs, OOB ad lib *FEN/GI: will ask nephro about continuing MIVF (decreased). Regular diet *Dispo: once Cr normal. Anticipate tomorrow d/c  Cornelia Copa MD Attending Center for [facility]) GYN Consult Phone: [phone] (M-F, 0800-1700) & [phone] (Off hours,  weekends, holidays)

## 2020-07-07 DIAGNOSIS — O26832 Pregnancy related renal disease, second trimester: Principal | ICD-10-CM

## 2020-07-07 LAB — URINE CULTURE

## 2020-07-07 LAB — BASIC METABOLIC PANEL
Anion gap: 4 — ABNORMAL LOW (ref 5–15)
BUN: 13 mg/dL (ref 6–20)
CO2: 22 mmol/L (ref 22–32)
Calcium: 8.9 mg/dL (ref 8.9–10.3)
Chloride: 111 mmol/L (ref 98–111)
Creatinine, Ser: 1.22 mg/dL — ABNORMAL HIGH (ref 0.44–1.00)
GFR, Estimated: >60 mL/min (ref >60)
Glucose, Bld: 77 mg/dL (ref 70–99)
Potassium: 3.5 mmol/L (ref 3.5–5.1)
Sodium: 137 mmol/L (ref 135–145)

## 2020-07-07 MED ORDER — ONDANSETRON 4 MG PO TBDP: 4.0000 mg | ORAL_TABLET | Freq: Four times a day (QID) | ORAL | 0 refills | Status: DC | PRN

## 2020-07-07 MED ORDER — CLOTRIMAZOLE 1 % VA CREA: 1.0000 | TOPICAL_CREAM | Freq: Every day | VAGINAL | 0 refills | Status: DC

## 2020-07-07 MED ORDER — HYDROCORTISONE 1 % EX CREA: TOPICAL_CREAM | Freq: Two times a day (BID) | CUTANEOUS | 0 refills | Status: AC | PRN

## 2020-07-07 NOTE — Discharge Summary (Signed)
Physician Discharge Summary  Patient ID: Cindy Cruz MRN: [id number] DOB/AGE: Jul 30, 1986 34 y.o.  Admit date: 07/03/2020 Discharge date: 07/07/2020  Admission Diagnoses: Pregnancy at 25/4. Nausea and vomiting. AKI  Discharge Diagnoses: Resolved n/v of pregnancy. Resolving AKI  Discharged Condition: good  Follow Up Labs: Please repeat her BMP on 11/24  Hospital Course: Initial Cr 5.39 and patient aggresively fluid hydrated and downtrending. Patient seen by Renal who deemed her stable for discharge. UCx, Renal u/s negative. Fetus was reassuring with daily reactive NSTs during hospitalization.   Results for Cindy Cruz, Cindy Cruz (MRN [id number]) as of 07/11/2020 08:48  Ref. Range 03/06/2020 16:07 07/03/2020 17:22 07/04/2020 06:12 07/05/2020 05:30 07/06/2020 05:18 07/07/2020 05:31  Creatinine Latest Ref Range: 0.44 - 1.00 mg/dL 0.71 5.39 (H) 5.07 (H) 2.96 (H) 1.54 (H) 1.22 (H)   CMP Latest Ref Rng & Units 07/07/2020 07/06/2020 07/05/2020  Glucose 70 - 99 mg/dL 77 76 82  BUN 6 - 20 mg/dL 13 24(H) 35(H)  Creatinine 0.44 - 1.00 mg/dL 1.22(H) 1.54(H) 2.96(H)  Sodium 135 - 145 mmol/L 137 138 134(L)  Potassium 3.5 - 5.1 mmol/L 3.5 3.8 3.8  Chloride 98 - 111 mmol/L 111 111 114(H)  CO2 22 - 32 mmol/L 22 18(L) 16(L)  Calcium 8.9 - 10.3 mg/dL 8.9 9.7 9.6  Total Protein 6.5 - 8.1 g/dL - - 5.3(L)  Total Bilirubin 0.3 - 1.2 mg/dL - - 0.3  Alkaline Phos 38 - 126 U/L - - 70  AST 15 - 41 U/L - - 24  ALT 0 - 44 U/L - - 17    Consults: nephrology  Treatments: IV hydration  Discharge Exam: Blood pressure (!) 110/58, pulse 85, temperature 98.6 F (37 C), temperature source Oral, resp. rate 18, height 5' 5" (1.651 m), weight 84.6 kg, last menstrual period 12/30/2019, SpO2 100 %, not currently breastfeeding. General appearance: alert Resp: clear to auscultation bilaterally Cardio: regular rate and rhythm, S1, S2 normal, no murmur, click, rub or gallop GI: gravid, nttp Extremities: extremities  normal, atraumatic, no cyanosis or edema Skin: warm and dry  Disposition: Discharge disposition: 01-Home or Self Care       Discharge Instructions    Discharge patient   Complete by: As directed    After reassuring NST   Discharge disposition: 01-Home or Self Care   Discharge patient date: 07/07/2020     Allergies as of 07/07/2020   No Known Allergies     Medication List    STOP taking these medications   calcium carbonate 500 MG chewable tablet Commonly known as: TUMS - dosed in mg elemental calcium   metroNIDAZOLE 500 MG tablet Commonly known as: FLAGYL   terconazole 0.4 % vaginal cream Commonly known as: TERAZOL 7     TAKE these medications   Blood Pressure Kit Devi 1 kit by Does not apply route as needed. Large cuff   clotrimazole 1 % vaginal cream Commonly known as: GYNE-LOTRIMIN Place 1 Applicatorful vaginally at bedtime for 7 days.   Comfort Fit Maternity Supp Lg Misc 1 application by Does not apply route daily.   hydrocortisone cream 1 % Apply topically 2 (two) times daily as needed for up to 14 days for itching.   multivitamin-prenatal 27-0.8 MG Tabs tablet Take 1 tablet by mouth daily at 12 noon.   omeprazole 20 MG capsule Commonly known as: PriLOSEC Take 1 capsule (20 mg total) by mouth 2 (two) times daily before a meal.   ondansetron 4 MG disintegrating tablet Commonly known as:  Zofran ODT Take 1 tablet (4 mg total) by mouth every 6 (six) hours as needed for nausea.       Follow-up Information    [facility]. Go in 5 day(s).   Specialty: Obstetrics and Gynecology Contact information: [address] [place] [phone]              Signed: Aletha Halim 07/07/2020, 8:41 AM

## 2020-07-07 NOTE — Discharge Instructions (Signed)

## 2020-07-07 NOTE — Plan of Care (Signed)
Patient to be discharged with printed instructions. No concerns noted. Mally Gavina L Breea Loncar, RN  

## 2020-07-07 NOTE — Plan of Care (Signed)
  Problem: Education: Goal: Knowledge of General Education information will improve Description: Including pain rating scale, medication(s)/side effects and non-pharmacologic comfort measures Outcome: Completed/Met   Problem: Activity: Goal: Risk for activity intolerance will decrease Outcome: Completed/Met   Problem: Nutrition: Goal: Adequate nutrition will be maintained Outcome: Completed/Met   Problem: Coping: Goal: Level of anxiety will decrease Outcome: Completed/Met   Problem: Elimination: Goal: Will not experience complications related to bowel motility Outcome: Completed/Met Goal: Will not experience complications related to urinary retention Outcome: Completed/Met   Problem: Education: Goal: Knowledge of disease or condition will improve Outcome: Completed/Met   Problem: Education: Goal: Knowledge of disease or condition will improve Outcome: Completed/Met Goal: Knowledge of the prescribed therapeutic regimen will improve Outcome: Completed/Met   Problem: Bowel/Gastric: Goal: Occurences of nausea and/or vomiting will decrease Outcome: Completed/Met   Problem: Fluid Volume: Goal: Maintenance of adequate hydration will improve Outcome: Completed/Met   Problem: Nutritional: Goal: Achievement of adequate weight for body size and type will improve Outcome: Completed/Met   Problem: Clinical Measurements: Goal: Respiratory complications will improve Outcome: Not Applicable Goal: Cardiovascular complication will be avoided Outcome: Not Applicable

## 2020-07-12 ENCOUNTER — Other Ambulatory Visit: Payer: Medicaid Other

## 2020-07-12 ENCOUNTER — Encounter: Payer: Self-pay | Admitting: Obstetrics and Gynecology

## 2020-07-12 ENCOUNTER — Ambulatory Visit (INDEPENDENT_AMBULATORY_CARE_PROVIDER_SITE_OTHER): Payer: Medicaid Other | Admitting: Obstetrics and Gynecology

## 2020-07-12 ENCOUNTER — Other Ambulatory Visit: Payer: Self-pay

## 2020-07-12 VITALS — BP 125/81 | HR 96 | Wt 204.0 lb

## 2020-07-12 DIAGNOSIS — Z148 Genetic carrier of other disease: Secondary | ICD-10-CM

## 2020-07-12 DIAGNOSIS — Z3480 Encounter for supervision of other normal pregnancy, unspecified trimester: Secondary | ICD-10-CM

## 2020-07-12 DIAGNOSIS — Z23 Encounter for immunization: Secondary | ICD-10-CM

## 2020-07-12 DIAGNOSIS — Z3A26 26 weeks gestation of pregnancy: Secondary | ICD-10-CM | POA: Diagnosis not present

## 2020-07-12 DIAGNOSIS — N179 Acute kidney failure, unspecified: Secondary | ICD-10-CM

## 2020-07-12 NOTE — Patient Instructions (Signed)
Third Trimester of Pregnancy The third trimester is from week 28 through week 40 (months 7 through 9). The third trimester is a time when the unborn baby (fetus) is growing rapidly. At the end of the ninth month, the fetus is about 20 inches in length and weighs 6-10 pounds. Body changes during your third trimester Your body will continue to go through many changes during pregnancy. The changes vary from woman to woman. During the third trimester:  Your weight will continue to increase. You can expect to gain 25-35 pounds (11-16 kg) by the end of the pregnancy.  You may begin to get stretch marks on your hips, abdomen, and breasts.  You may urinate more often because the fetus is moving lower into your pelvis and pressing on your bladder.  You may develop or continue to have heartburn. This is caused by increased hormones that slow down muscles in the digestive tract.  You may develop or continue to have constipation because increased hormones slow digestion and cause the muscles that push waste through your intestines to relax.  You may develop hemorrhoids. These are swollen veins (varicose veins) in the rectum that can itch or be painful.  You may develop swollen, bulging veins (varicose veins) in your legs.  You may have increased body aches in the pelvis, back, or thighs. This is due to weight gain and increased hormones that are relaxing your joints.  You may have changes in your hair. These can include thickening of your hair, rapid growth, and changes in texture. Some women also have hair loss during or after pregnancy, or hair that feels dry or thin. Your hair will most likely return to normal after your baby is born.  Your breasts will continue to grow and they will continue to become tender. A yellow fluid (colostrum) may leak from your breasts. This is the first milk you are producing for your baby.  Your belly button may stick out.  You may notice more swelling in your hands,  face, or ankles.  You may have increased tingling or numbness in your hands, arms, and legs. The skin on your belly may also feel numb.  You may feel short of breath because of your expanding uterus.  You may have more problems sleeping. This can be caused by the size of your belly, increased need to urinate, and an increase in your body's metabolism.  You may notice the fetus "dropping," or moving lower in your abdomen (lightening).  You may have increased vaginal discharge.  You may notice your joints feel loose and you may have pain around your pelvic bone. What to expect at prenatal visits You will have prenatal exams every 2 weeks until week 36. Then you will have weekly prenatal exams. During a routine prenatal visit:  You will be weighed to make sure you and the baby are growing normally.  Your blood pressure will be taken.  Your abdomen will be measured to track your baby's growth.  The fetal heartbeat will be listened to.  Any test results from the previous visit will be discussed.  You may have a cervical check near your due date to see if your cervix has softened or thinned (effaced).  You will be tested for Group B streptococcus. This happens between 35 and 37 weeks. Your health care provider may ask you:  What your birth plan is.  How you are feeling.  If you are feeling the baby move.  If you have had any abnormal   symptoms, such as leaking fluid, bleeding, severe headaches, or abdominal cramping.  If you are using any tobacco products, including cigarettes, chewing tobacco, and electronic cigarettes.  If you have any questions. Other tests or screenings that may be performed during your third trimester include:  Blood tests that check for low iron levels (anemia).  Fetal testing to check the health, activity level, and growth of the fetus. Testing is done if you have certain medical conditions or if there are problems during the pregnancy.  Nonstress test  (NST). This test checks the health of your baby to make sure there are no signs of problems, such as the baby not getting enough oxygen. During this test, a belt is placed around your belly. The baby is made to move, and its heart rate is monitored during movement. What is false labor? False labor is a condition in which you feel small, irregular tightenings of the muscles in the womb (contractions) that usually go away with rest, changing position, or drinking water. These are called Braxton Hicks contractions. Contractions may last for hours, days, or even weeks before true labor sets in. If contractions come at regular intervals, become more frequent, increase in intensity, or become painful, you should see your health care provider. What are the signs of labor?  Abdominal cramps.  Regular contractions that start at 10 minutes apart and become stronger and more frequent with time.  Contractions that start on the top of the uterus and spread down to the lower abdomen and back.  Increased pelvic pressure and dull back pain.  A watery or bloody mucus discharge that comes from the vagina.  Leaking of amniotic fluid. This is also known as your "water breaking." It could be a slow trickle or a gush. Let your health care provider know if it has a color or strange odor. If you have any of these signs, call your health care provider right away, even if it is before your due date. Follow these instructions at home: Medicines  Follow your health care provider's instructions regarding medicine use. Specific medicines may be either safe or unsafe to take during pregnancy.  Take a prenatal vitamin that contains at least 600 micrograms (mcg) of folic acid.  If you develop constipation, try taking a stool softener if your health care provider approves. Eating and drinking   Eat a balanced diet that includes fresh fruits and vegetables, whole grains, good sources of protein such as meat, eggs, or tofu,  and low-fat dairy. Your health care provider will help you determine the amount of weight gain that is right for you.  Avoid raw meat and uncooked cheese. These carry germs that can cause birth defects in the baby.  If you have low calcium intake from food, talk to your health care provider about whether you should take a daily calcium supplement.  Eat four or five small meals rather than three large meals a day.  Limit foods that are high in fat and processed sugars, such as fried and sweet foods.  To prevent constipation: ? Drink enough fluid to keep your urine clear or pale yellow. ? Eat foods that are high in fiber, such as fresh fruits and vegetables, whole grains, and beans. Activity  Exercise only as directed by your health care provider. Most women can continue their usual exercise routine during pregnancy. Try to exercise for 30 minutes at least 5 days a week. Stop exercising if you experience uterine contractions.  Avoid heavy lifting.  Do   not exercise in extreme heat or humidity, or at high altitudes.  Wear low-heel, comfortable shoes.  Practice good posture.  You may continue to have sex unless your health care provider tells you otherwise. Relieving pain and discomfort  Take frequent breaks and rest with your legs elevated if you have leg cramps or low back pain.  Take warm sitz baths to soothe any pain or discomfort caused by hemorrhoids. Use hemorrhoid cream if your health care provider approves.  Wear a good support bra to prevent discomfort from breast tenderness.  If you develop varicose veins: ? Wear support pantyhose or compression stockings as told by your healthcare provider. ? Elevate your feet for 15 minutes, 3-4 times a day. Prenatal care  Write down your questions. Take them to your prenatal visits.  Keep all your prenatal visits as told by your health care provider. This is important. Safety  Wear your seat belt at all times when driving.  Make  a list of emergency phone numbers, including numbers for family, friends, the hospital, and police and fire departments. General instructions  Avoid cat litter boxes and soil used by cats. These carry germs that can cause birth defects in the baby. If you have a cat, ask someone to clean the litter box for you.  Do not travel far distances unless it is absolutely necessary and only with the approval of your health care provider.  Do not use hot tubs, steam rooms, or saunas.  Do not drink alcohol.  Do not use any products that contain nicotine or tobacco, such as cigarettes and e-cigarettes. If you need help quitting, ask your health care provider.  Do not use any medicinal herbs or unprescribed drugs. These chemicals affect the formation and growth of the baby.  Do not douche or use tampons or scented sanitary pads.  Do not cross your legs for long periods of time.  To prepare for the arrival of your baby: ? Take prenatal classes to understand, practice, and ask questions about labor and delivery. ? Make a trial run to the hospital. ? Visit the hospital and tour the maternity area. ? Arrange for maternity or paternity leave through employers. ? Arrange for family and friends to take care of pets while you are in the hospital. ? Purchase a rear-facing car seat and make sure you know how to install it in your car. ? Pack your hospital bag. ? Prepare the baby's nursery. Make sure to remove all pillows and stuffed animals from the baby's crib to prevent suffocation.  Visit your dentist if you have not gone during your pregnancy. Use a soft toothbrush to brush your teeth and be gentle when you floss. Contact a health care provider if:  You are unsure if you are in labor or if your water has broken.  You become dizzy.  You have mild pelvic cramps, pelvic pressure, or nagging pain in your abdominal area.  You have lower back pain.  You have persistent nausea, vomiting, or  diarrhea.  You have an unusual or bad smelling vaginal discharge.  You have pain when you urinate. Get help right away if:  Your water breaks before 37 weeks.  You have regular contractions less than 5 minutes apart before 37 weeks.  You have a fever.  You are leaking fluid from your vagina.  You have spotting or bleeding from your vagina.  You have severe abdominal pain or cramping.  You have rapid weight loss or weight gain.  You have   shortness of breath with chest pain.  You notice sudden or extreme swelling of your face, hands, ankles, feet, or legs.  Your baby makes fewer than 10 movements in 2 hours.  You have severe headaches that do not go away when you take medicine.  You have vision changes. Summary  The third trimester is from week 28 through week 40, months 7 through 9. The third trimester is a time when the unborn baby (fetus) is growing rapidly.  During the third trimester, your discomfort may increase as you and your baby continue to gain weight. You may have abdominal, leg, and back pain, sleeping problems, and an increased need to urinate.  During the third trimester your breasts will keep growing and they will continue to become tender. A yellow fluid (colostrum) may leak from your breasts. This is the first milk you are producing for your baby.  False labor is a condition in which you feel small, irregular tightenings of the muscles in the womb (contractions) that eventually go away. These are called Braxton Hicks contractions. Contractions may last for hours, days, or even weeks before true labor sets in.  Signs of labor can include: abdominal cramps; regular contractions that start at 10 minutes apart and become stronger and more frequent with time; watery or bloody mucus discharge that comes from the vagina; increased pelvic pressure and dull back pain; and leaking of amniotic fluid. This information is not intended to replace advice given to you by your  health care provider. Make sure you discuss any questions you have with your health care provider. Document Revised: 11/26/2018 Document Reviewed: 09/10/2016 Elsevier Patient Education  2020 [employer]  

## 2020-07-12 NOTE — Progress Notes (Signed)
Subjective:  Cindy Cruz is a 34 y.o. [id number] at 75w6dbeing seen today for ongoing prenatal care.  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  Patient reports recently in hosptial for N/V and AKI. Doing better. Some skin itching. Tolerating regular diet.  Contractions: Not present. Vag. Bleeding: None.  Movement: Present. Denies leaking of fluid.   The following portions of the patient's history were reviewed and updated as appropriate: allergies, current medications, past family history, past medical history, past social history, past surgical history and problem list. Problem list updated.  Objective:   Vitals:   07/12/20 0941  BP: 125/81  Pulse: 96  Weight: 204 lb (92.5 kg)    Fetal Status: Fetal Heart Rate (bpm): 140   Movement: Present     General:  Alert, oriented and cooperative. Patient is in no acute distress.  Skin: Skin is warm and dry. No rash noted.   Cardiovascular: Normal heart rate noted  Respiratory: Normal respiratory effort, no problems with respiration noted  Abdomen: Soft, gravid, appropriate for gestational age. Pain/Pressure: Present     Pelvic:  Cervical exam deferred        Extremities: Normal range of motion.  Edema: Trace  Mental Status: Normal mood and affect. Normal behavior. Normal judgment and thought content.   Urinalysis:      Assessment and Plan:  Pregnancy: [id number] 275w6d1. [redacted] weeks gestation of pregnancy Stable Declined flu vaccine - Glucose Tolerance, 2 Hours w/1 Hour - CBC - HIV Antibody (routine testing w rflx) - RPR - Basic Metabolic Panel (BMET) - Tdap vaccine greater than or equal to 7yo IM  2. AKI (acute kidney injury) (HCArp - Basic Metabolic Panel (BMET) - Comp Met (CMET)  3. Supervision of other normal  pregnancy, antepartum Stable F/U growth scan next month  4. 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 Acute renal failure, unspecified acute renal failure type ([place]  Preterm labor symptoms and general obstetric precautions including but not limited to vaginal bleeding, contractions, leaking of fluid and fetal movement were reviewed in detail with the patient. Please refer to After Visit Summary for other counseling recommendations.  Return in about 3 weeks (around 08/02/2020) for OB visit, face to face, MD only.   ErChancy MilroyMD

## 2020-07-12 NOTE — Progress Notes (Signed)
Patient presents for ROB and GTT. Patient has no concerns today. 

## 2020-07-13 LAB — COMPREHENSIVE METABOLIC PANEL
ALT: 13 IU/L (ref 0–32)
AST: 14 IU/L (ref 0–40)
Albumin/Globulin Ratio: 1.2 (ref 1.2–2.2)
Albumin: 3.2 g/dL — ABNORMAL LOW (ref 3.8–4.8)
Alkaline Phosphatase: 71 IU/L (ref 44–121)
BUN/Creatinine Ratio: 7 — ABNORMAL LOW (ref 9–23)
BUN: 4 mg/dL — ABNORMAL LOW (ref 6–20)
Bilirubin Total: <0.2 mg/dL (ref 0.0–1.2)
CO2: 22 mmol/L (ref 20–29)
Calcium: 8.8 mg/dL (ref 8.7–10.2)
Chloride: 104 mmol/L (ref 96–106)
Creatinine, Ser: 0.55 mg/dL — ABNORMAL LOW (ref 0.57–1.00)
GFR calc Af Amer: 143 mL/min/{1.73_m2} (ref >59)
GFR calc non Af Amer: 124 mL/min/{1.73_m2} (ref >59)
Globulin, Total: 2.6 g/dL (ref 1.5–4.5)
Glucose: 78 mg/dL (ref 65–99)
Potassium: 3.7 mmol/L (ref 3.5–5.2)
Sodium: 138 mmol/L (ref 134–144)
Total Protein: 5.8 g/dL — ABNORMAL LOW (ref 6.0–8.5)

## 2020-07-13 LAB — CBC
Hematocrit: 26.7 % — ABNORMAL LOW (ref 34.0–46.6)
Hemoglobin: 8.8 g/dL — ABNORMAL LOW (ref 11.1–15.9)
MCH: 27.9 pg (ref 26.6–33.0)
MCHC: 33 g/dL (ref 31.5–35.7)
MCV: 85 fL (ref 79–97)
Platelets: 303 10*3/uL (ref 150–450)
RBC: 3.15 x10E6/uL — ABNORMAL LOW (ref 3.77–5.28)
RDW: 14.6 % (ref 11.7–15.4)
WBC: 8 10*3/uL (ref 3.4–10.8)

## 2020-07-13 LAB — GLUCOSE TOLERANCE, 2 HOURS W/ 1HR
Glucose, 1 hour: 110 mg/dL (ref 65–179)
Glucose, 2 hour: 86 mg/dL (ref 65–152)
Glucose, Fasting: 78 mg/dL (ref 65–91)

## 2020-07-13 LAB — RPR: RPR Ser Ql: NONREACTIVE

## 2020-07-13 LAB — HIV ANTIBODY (ROUTINE TESTING W REFLEX): HIV Screen 4th Generation wRfx: NONREACTIVE

## 2020-07-17 ENCOUNTER — Encounter: Payer: Self-pay | Admitting: Obstetrics and Gynecology

## 2020-07-17 ENCOUNTER — Other Ambulatory Visit: Payer: Self-pay | Admitting: Obstetrics and Gynecology

## 2020-07-17 DIAGNOSIS — O99019 Anemia complicating pregnancy, unspecified trimester: Secondary | ICD-10-CM | POA: Insufficient documentation

## 2020-07-18 ENCOUNTER — Telehealth: Payer: Self-pay

## 2020-07-18 NOTE — Telephone Encounter (Signed)
Patient inform of test results.  

## 2020-07-18 NOTE — Telephone Encounter (Signed)
-----   Message from Hermina Staggers, MD sent at 07/17/2020  2:23 PM EST ----- Please schedule pt for iron infusion. I have placed orders and counseled her. Check CBC in 4 - 6 weeks after infusion. Thanks Casimiro Needle

## 2020-07-23 ENCOUNTER — Other Ambulatory Visit: Payer: Self-pay

## 2020-07-23 ENCOUNTER — Ambulatory Visit (HOSPITAL_COMMUNITY)
Admission: EM | Admit: 2020-07-23 | Discharge: 2020-07-23 | Disposition: A | Discharge status: Home / Self Care | Payer: Medicaid Other | Attending: Family Medicine | Admitting: Family Medicine

## 2020-07-23 ENCOUNTER — Encounter (HOSPITAL_COMMUNITY): Payer: Self-pay | Admitting: Emergency Medicine

## 2020-07-23 ENCOUNTER — Telehealth (HOSPITAL_COMMUNITY): Payer: Self-pay | Admitting: *Deleted

## 2020-07-23 DIAGNOSIS — R21 Rash and other nonspecific skin eruption: Secondary | ICD-10-CM

## 2020-07-23 MED ORDER — FAMOTIDINE 40 MG PO TABS: 40.0000 mg | ORAL_TABLET | Freq: Two times a day (BID) | ORAL | 0 refills | Status: DC

## 2020-07-23 MED ORDER — FAMOTIDINE 40 MG PO TABS
40.0000 mg | ORAL_TABLET | Freq: Two times a day (BID) | ORAL | 0 refills | Status: DC
Start: 2020-07-23 — End: 2020-09-07

## 2020-07-23 MED ORDER — CLOBETASOL PROPIONATE 0.05 % EX OINT: 1.0000 "application " | TOPICAL_OINTMENT | Freq: Two times a day (BID) | CUTANEOUS | 0 refills | Status: DC

## 2020-07-23 MED ORDER — CLOBETASOL PROPIONATE 0.05 % EX OINT
1.0000 | TOPICAL_OINTMENT | Freq: Two times a day (BID) | CUTANEOUS | 0 refills | Status: DC
Start: 2020-07-23 — End: 2020-10-05

## 2020-07-23 NOTE — ED Notes (Signed)
This Clinical research associate reported to Racheal L. PA that pt was contacted and Pt will return tomorrow.

## 2020-07-23 NOTE — ED Triage Notes (Addendum)
Pt reports a rash to ABD and bil. Arms. Pt also reported she felt dizzy on the way to UC today. Paint due date FEB-2022

## 2020-07-23 NOTE — Discharge Instructions (Addendum)
Take the pepcid alongside an antihistamine such as zyrtec or benadryl twice daily until rash resolves

## 2020-07-23 NOTE — ED Notes (Signed)
TC to [facility] [place] to report blood needed to be redrawn because blood was placed in wrong color tube

## 2020-07-25 NOTE — ED Notes (Signed)
TC call to Pt as a follow up to return to UC for blood work. Pt reports she will try to return tonight.

## 2020-07-26 NOTE — ED Provider Notes (Signed)
[name]    CSN: [id number] Arrival date & time: 07/23/20  1555      History   Chief Complaint Chief Complaint  Patient presents with  . Rash  . Dizziness    HPI Cindy Cruz is a 34 y.o. female.   Here today with an itchy rash that has been present across her abdomen and portions of her back that has been present for about 2 weeks now. Started while she was admitted at that time for a pregnancy complication, has been taking benadryl and using cortisone cream since onset with mild relief but no resolution. Denies new medications, new foods or vitamins, recent travel or other exposures. Denies fever, chills, CP, SOB, abdominal pain, swelling of extremities. Is in her third trimester of pregnancy currently and has f/u with OBGYN next week.      Past Medical History:  Diagnosis Date  . 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 Depression    ok now  . GERD (gastroesophageal reflux disease)    indigestion with pregnancy only; Rx med helps; pt cannot remember name of med  . Trichomoniasis     Patient Active Problem List   Diagnosis Date Noted  . Anemia affecting pregnancy 07/17/2020  . 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 Supervision of other normal pregnancy, antepartum 03/20/2020  . [place] multipara 03/31/2018    Past Surgical History:  Procedure Laterality Date  . WISDOM TOOTH EXTRACTION      OB History    Gravida  8   Para  6   Term  6   Preterm  0   AB  1   Living  6     SAB  0   TAB  0   Ectopic  0   Multiple  0   Live Births  6            Home Medications    Prior to Admission medications   Medication Sig Start Date End Date Taking? Authorizing Provider  Blood Pressure Monitoring (BLOOD PRESSURE KIT) DEVI 1 kit by Does not apply route as needed. Large cuff 03/20/20  Yes Shelly Bombard, MD  Elastic Bandages & Supports (COMFORT FIT MATERNITY SUPP LG) MISC 1 application by Does not  apply route daily. 05/22/20  Yes Julianne Handler, CNM  ondansetron (ZOFRAN ODT) 4 MG disintegrating tablet Take 1 tablet (4 mg total) by mouth every 6 (six) hours as needed for nausea. 07/07/20  Yes Aletha Halim, MD  Prenatal Vit-Fe Fumarate-FA (MULTIVITAMIN-PRENATAL) 27-0.8 MG TABS tablet Take 1 tablet by mouth daily at 12 noon.   Yes [provider]  clobetasol ointment (TEMOVATE) 2.03 % Apply 1 application topically 2 (two) times daily. Do not use for more than 1 - 1.5 weeks at a time 07/23/20   Volney American, PA-C  famotidine (PEPCID) 40 MG tablet Take 1 tablet (40 mg total) by mouth 2 (two) times daily. 07/23/20   Volney American, PA-C  omeprazole (PRILOSEC) 20 MG capsule Take 1 capsule (20 mg total) by mouth 2 (two) times daily before a meal. 03/20/20   Shelly Bombard, MD    Family History Family History  Problem Relation Age of Onset  . Hypertension Mother   . Cancer Maternal Aunt        breast  . Hearing loss Neg Hx     Social History Social History   Tobacco Use  . Smoking status: Light Tobacco  Smoker    Packs/day: 0.25    Years: 10.00    Pack years: 2.50    Types: Cigarettes  . Smokeless tobacco: Never Used  Vaping Use  . Vaping Use: Never used  Substance Use Topics  . Alcohol use: Not Currently    Comment: socially on occ.-liquor  . Drug use: No     Allergies   Patient has no known allergies.   Review of Systems Review of Systems PER HPI    Physical Exam Triage Vital Signs ED Triage Vitals  Enc Vitals Group     BP 07/23/20 1619 125/74     Pulse Rate 07/23/20 1619 (!) 103     Resp 07/23/20 1619 18     Temp 07/23/20 1619 (!) 97.5 F (36.4 C)     Temp Source 07/23/20 1619 Tympanic     SpO2 07/23/20 1619 99 %     Weight --      Height 07/23/20 1622 5' 5"  (1.651 m)     Head Circumference --      Peak Flow --      Pain Score 07/23/20 1621 0     Pain Loc --      Pain Edu? --      Excl. in [place]? --    No data  found.  Updated Vital Signs BP 125/74 (BP Location: Right Arm)   Pulse (!) 103   Temp (!) 97.5 F (36.4 C) (Tympanic)   Resp 18   Ht 5' 5"  (1.651 m)   LMP 12/30/2019   SpO2 99%   BMI 33.95 kg/m   Visual Acuity Right Eye Distance:   Left Eye Distance:   Bilateral Distance:    Right Eye Near:   Left Eye Near:    Bilateral Near:     Physical Exam Vitals and nursing note reviewed.  Constitutional:      Appearance: Normal appearance. She is not ill-appearing.  HENT:     Head: Atraumatic.  Eyes:     Extraocular Movements: Extraocular movements intact.     Conjunctiva/sclera: Conjunctivae normal.  Cardiovascular:     Rate and Rhythm: Normal rate and regular rhythm.     Heart sounds: Normal heart sounds.  Pulmonary:     Effort: Pulmonary effort is normal.     Breath sounds: Normal breath sounds.  Musculoskeletal:        General: Normal range of motion.     Cervical back: Normal range of motion and neck supple.  Skin:    General: Skin is warm and dry.     Findings: Rash (erythematous maculopapular rash across abdomen diffusely and portions of back and chest) present.  Neurological:     Mental Status: She is alert and oriented to person, place, and time.  Psychiatric:        Mood and Affect: Mood normal.        Thought Content: Thought content normal.        Judgment: Judgment normal.      UC Treatments / Results  Labs (all labs ordered are listed, but only abnormal results are displayed) Labs Reviewed - No data to display  EKG   Radiology No results found.  Procedures Procedures (including critical care time)  Medications Ordered in UC Medications - No data to display  Initial Impression / Assessment and Plan / UC Course  I have reviewed the triage vital signs and the nursing notes.  Pertinent labs & imaging results that were available during my care  of the patient were reviewed by me and considered in my medical decision making (see chart for  details).  Clinical Course as of Jul 26 714  Sun Jul 23, 2020  1721 Pulse Rate(!): 103 [RL]    Clinical Course User Index [RL] Volney American, PA-C    HR improved on recheck during exam to 84 bpm. Rash appears allergic, will add pepcid and clobetasol ointment to benadryl regimen and have her f/u as scheduled with OBGYN for recheck. Discussed monitoring for anything scented or irritating and avoiding those types of products.   Final Clinical Impressions(s) / UC Diagnoses   Final diagnoses:  Rash     Discharge Instructions     Take the pepcid alongside an antihistamine such as zyrtec or benadryl twice daily until rash resolves    ED Prescriptions    Medication Sig Dispense Auth. Provider   clobetasol ointment (TEMOVATE) 1.02 % Apply 1 application topically 2 (two) times daily. Do not use for more than 1 - 1.5 weeks at a time 80 g Volney American, PA-C   famotidine (PEPCID) 40 MG tablet Take 1 tablet (40 mg total) by mouth 2 (two) times daily. 60 tablet Volney American, [place]     PDMP not reviewed this encounter.   Volney American, [place] 07/26/20 [phone]

## 2020-07-27 ENCOUNTER — Other Ambulatory Visit: Payer: Self-pay

## 2020-07-27 ENCOUNTER — Ambulatory Visit (HOSPITAL_COMMUNITY)
Admission: RE | Admit: 2020-07-27 | Discharge: 2020-07-27 | Disposition: A | Discharge status: Home / Self Care | Payer: Medicaid Other | Source: Ambulatory Visit | Attending: Obstetrics and Gynecology | Admitting: Obstetrics and Gynecology

## 2020-07-27 DIAGNOSIS — N179 Acute kidney failure, unspecified: Secondary | ICD-10-CM | POA: Diagnosis present

## 2020-07-27 DIAGNOSIS — D631 Anemia in chronic kidney disease: Secondary | ICD-10-CM | POA: Diagnosis not present

## 2020-07-27 MED ORDER — SODIUM CHLORIDE 0.9 % IV SOLN
510.0000 mg | Freq: Once | INTRAVENOUS | Status: AC
  Administered 2020-07-27: 510 mg via INTRAVENOUS
  Filled 2020-07-27: qty 17

## 2020-07-27 NOTE — Discharge Instructions (Signed)

## 2020-08-02 ENCOUNTER — Encounter: Payer: Medicaid Other | Admitting: Obstetrics & Gynecology

## 2020-08-08 ENCOUNTER — Ambulatory Visit: Payer: Medicaid Other

## 2020-08-19 NOTE — L&D Delivery Note (Addendum)
OB/GYN Faculty Practice Delivery Note  Cindy Cruz is a 35 y.o. [id number] s/p SVD at [redacted]w[redacted]d. She was admitted for SOL.   ROM: Spontaneous, 0h 24m with thick meconium stained fluid with particulate GBS Status: Positive; did not receive adequate intrapartum prophylaxis due to precipitous delivery Maximum Maternal Temperature: 34F  Labor Progress: . Presented with SOL.  Had SROM with thick meconium stained fluid with particulate.  Rapidly progressed to complete.    Delivery Date/Time: 10/03/2020 at 0414 Delivery: Called to room and patient was complete and pushing. Head delivered LOA. Had a loose nuchal cord x1 that was delivered through. Shoulder and body delivered in usual fashion. Infant with spontaneous cry, placed on mother's abdomen, dried and stimulated. Cord clamped x 2 after 1-minute delay, and cut by patient under my direct supervision. Cord blood drawn. Placenta delivered spontaneously with gentle cord traction. Fundus firm with massage and Pitocin. Labia, perineum, vagina, and cervix were inspected, found to be intact.   Placenta: Intact, 3 vessel cord Complications: None Lacerations: None EBL: 150 cc Analgesia: None  Infant: Viable female  APGARs 8 & 9  weight pending  Cindy Genene Churn, MD 10/03/2020, 4:41 AM  I was gloved and present for delivery of infant and placenta and agree with the documentation as noted above.  Sheila Oats, MD OB Fellow, Faculty Practice 10/03/2020 8:46 AM

## 2020-08-21 ENCOUNTER — Encounter: Payer: Self-pay | Admitting: *Deleted

## 2020-08-21 ENCOUNTER — Ambulatory Visit: Payer: Medicaid Other | Admitting: *Deleted

## 2020-08-21 ENCOUNTER — Encounter: Payer: Medicaid Other | Admitting: Obstetrics and Gynecology

## 2020-08-21 ENCOUNTER — Ambulatory Visit: Payer: Medicaid Other | Attending: Obstetrics

## 2020-08-21 ENCOUNTER — Other Ambulatory Visit: Payer: Self-pay

## 2020-08-21 VITALS — BP 119/66 | HR 92

## 2020-08-21 DIAGNOSIS — O0943 Supervision of pregnancy with grand multiparity, third trimester: Secondary | ICD-10-CM | POA: Insufficient documentation

## 2020-08-21 DIAGNOSIS — Z3A32 32 weeks gestation of pregnancy: Secondary | ICD-10-CM

## 2020-08-21 DIAGNOSIS — Z6841 Body Mass Index (BMI) 40.0 and over, adult: Secondary | ICD-10-CM

## 2020-08-21 DIAGNOSIS — Z641 Problems related to multiparity: Secondary | ICD-10-CM

## 2020-08-23 ENCOUNTER — Encounter (HOSPITAL_COMMUNITY): Payer: Self-pay | Admitting: Obstetrics & Gynecology

## 2020-08-23 ENCOUNTER — Inpatient Hospital Stay (HOSPITAL_COMMUNITY)
Admission: AD | Admit: 2020-08-23 | Discharge: 2020-08-23 | Disposition: A | Discharge status: Home / Self Care | Payer: Medicaid Other | Attending: Obstetrics & Gynecology | Admitting: Obstetrics & Gynecology

## 2020-08-23 DIAGNOSIS — O98513 Other viral diseases complicating pregnancy, third trimester: Secondary | ICD-10-CM | POA: Diagnosis not present

## 2020-08-23 DIAGNOSIS — B373 Candidiasis of vulva and vagina: Secondary | ICD-10-CM | POA: Diagnosis not present

## 2020-08-23 DIAGNOSIS — F17210 Nicotine dependence, cigarettes, uncomplicated: Secondary | ICD-10-CM | POA: Insufficient documentation

## 2020-08-23 DIAGNOSIS — O98813 Other maternal infectious and parasitic diseases complicating pregnancy, third trimester: Secondary | ICD-10-CM | POA: Diagnosis not present

## 2020-08-23 DIAGNOSIS — Z3A32 32 weeks gestation of pregnancy: Secondary | ICD-10-CM | POA: Insufficient documentation

## 2020-08-23 DIAGNOSIS — B3731 Acute candidiasis of vulva and vagina: Secondary | ICD-10-CM

## 2020-08-23 DIAGNOSIS — U071 COVID-19: Secondary | ICD-10-CM | POA: Insufficient documentation

## 2020-08-23 DIAGNOSIS — O99333 Smoking (tobacco) complicating pregnancy, third trimester: Secondary | ICD-10-CM | POA: Insufficient documentation

## 2020-08-23 DIAGNOSIS — R52 Pain, unspecified: Secondary | ICD-10-CM

## 2020-08-23 LAB — URINALYSIS, ROUTINE W REFLEX MICROSCOPIC
Bilirubin Urine: NEGATIVE
Glucose, UA: NEGATIVE mg/dL
Hgb urine dipstick: NEGATIVE
Ketones, ur: 80 mg/dL — AB
Leukocytes,Ua: NEGATIVE
Nitrite: NEGATIVE
Protein, ur: NEGATIVE mg/dL
Specific Gravity, Urine: 1.021 (ref 1.005–1.030)
pH: 6 (ref 5.0–8.0)

## 2020-08-23 LAB — WET PREP, GENITAL
Clue Cells Wet Prep HPF POC: NONE SEEN
Sperm: NONE SEEN
Trich, Wet Prep: NONE SEEN

## 2020-08-23 LAB — RESP PANEL BY RT-PCR (FLU A&B, COVID) ARPGX2
Influenza A by PCR: NEGATIVE
Influenza B by PCR: NEGATIVE
SARS Coronavirus 2 by RT PCR: POSITIVE — AB

## 2020-08-23 MED ORDER — TRAMADOL HCL 50 MG PO TABS
50.0000 mg | ORAL_TABLET | Freq: Four times a day (QID) | ORAL | 0 refills | Status: DC | PRN
Start: 2020-08-23 — End: 2020-10-05

## 2020-08-23 MED ORDER — TERCONAZOLE 0.4 % VA CREA: 1.0000 | TOPICAL_CREAM | Freq: Every day | VAGINAL | 0 refills | Status: DC

## 2020-08-23 MED ORDER — ALBUTEROL SULFATE HFA 108 (90 BASE) MCG/ACT IN AERS
1.0000 | INHALATION_SPRAY | Freq: Four times a day (QID) | RESPIRATORY_TRACT | 1 refills | Status: DC | PRN
Start: 2020-08-23 — End: 2020-10-05

## 2020-08-23 MED ORDER — CYCLOBENZAPRINE HCL 10 MG PO TABS: 10.0000 mg | ORAL_TABLET | Freq: Two times a day (BID) | ORAL | 0 refills | Status: DC | PRN

## 2020-08-23 MED ORDER — FLUTICASONE PROPIONATE 50 MCG/ACT NA SUSP: 2.0000 | Freq: Every day | NASAL | 1 refills | Status: DC

## 2020-08-23 MED ORDER — CYCLOBENZAPRINE HCL 5 MG PO TABS
10.0000 mg | ORAL_TABLET | Freq: Once | ORAL | Status: AC
  Administered 2020-08-23: 10 mg via ORAL
  Filled 2020-08-23: qty 2

## 2020-08-23 NOTE — MAU Note (Signed)
Pt reports having severe leg pain and back pain that has gotten worse since this morning. She rates it a 10/10 coming every few minutes. Denies LOF, vaginal bleeding and reports +FM.

## 2020-08-23 NOTE — MAU Provider Note (Signed)
Chief Complaint:  Back Pain   Event Date/Time   First Provider Initiated Contact with Patient 08/23/20 1317      HPI: Cindy Cruz is a 35 y.o. [id number] at 46w6dby early ultrasound who presents to maternity admissions reporting pain in her thighs and calves and lower back starting today. She denies any respiratory symptoms or sick contacts. She reports good fetal movement, denies LOF, vaginal bleeding, vaginal itching/burning, urinary symptoms, h/a, dizziness, n/v, or fever/chills.     Location: upper thighs and low back Quality: aching Severity: 10/10 on pain scale Duration: 1 day Timing: intermittent Modifying factors: none Associated signs and symptoms: none  HPI  Past Medical History: Past Medical History:  Diagnosis Date  . 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 Depression    ok now  . GERD (gastroesophageal reflux disease)    indigestion with pregnancy only; Rx med helps; pt cannot remember name of med  . Trichomoniasis     Past obstetric history: OB History  Gravida Para Term Preterm AB Living  8 6 6  0 1 6  SAB IAB Ectopic Multiple Live Births  0 0 0 0 6    # Outcome Date GA Lbr Len/2nd Weight Sex Delivery Anes PTL Lv  8 Current           7 Term 08/24/18 475w0d3:30 / 00:07 2830 g M Vag-Spont EPI  LIV     Birth Comments: WNL  6 Term 08/20/16 4149w0d:57 / 00:10 3575 g M Vag-Spont EPI  LIV  5 AB 05/25/14 16w44w4d     4 Term 01/04/09   2722 g M Vag-Spont None N LIV  3 Term 08/21/07   3175 g M Vag-Spont EPI N LIV  2 Term 03/25/05   2812 g F Vag-Spont EPI N LIV  1 Term 12/01/03   3266 g F Vag-Spont EPI N LIV    Past Surgical History: Past Surgical History:  Procedure Laterality Date  . WISDOM TOOTH EXTRACTION      Family History: Family History  Problem Relation Age of Onset  . Hypertension Mother   . Cancer Maternal Aunt        breast  . Hearing loss Neg Hx     Social History: Social History   Tobacco Use  . Smoking  status: Light Tobacco Smoker    Packs/day: 0.25    Years: 10.00    Pack years: 2.50    Types: Cigarettes  . Smokeless tobacco: Never Used  Vaping Use  . Vaping Use: Never used  Substance Use Topics  . Alcohol use: Not Currently    Comment: socially on occ.-liquor  . Drug use: No    Allergies: No Known Allergies  Meds:  Medications Prior to Admission  Medication Sig Dispense Refill Last Dose  . Blood Pressure Monitoring (BLOOD PRESSURE KIT) DEVI 1 kit by Does not apply route as needed. Large cuff 1 each 0 Past Month at Unknown time  . famotidine (PEPCID) 40 MG tablet Take 1 tablet (40 mg total) by mouth 2 (two) times daily. 60 tablet 0 08/22/2020 at Unknown time  . Prenatal Vit-Fe Fumarate-FA (MULTIVITAMIN-PRENATAL) 27-0.8 MG TABS tablet Take 1 tablet by mouth daily at 12 noon.   08/22/2020 at Unknown time  . clobetasol ointment (TEMOVATE) 0.052.20pply 1 application topically 2 (two) times daily. Do not use for more than 1 - 1.5 weeks at a time (Patient not taking: Reported on  08/21/2020) 80 g 0   . Elastic Bandages & Supports (COMFORT FIT MATERNITY SUPP LG) MISC 1 application by Does not apply route daily. 1 each 0   . omeprazole (PRILOSEC) 20 MG capsule Take 1 capsule (20 mg total) by mouth 2 (two) times daily before a meal. (Patient not taking: Reported on 08/21/2020) 60 capsule 5   . ondansetron (ZOFRAN ODT) 4 MG disintegrating tablet Take 1 tablet (4 mg total) by mouth every 6 (six) hours as needed for nausea. (Patient not taking: Reported on 08/21/2020) 20 tablet 0     ROS:  Review of Systems  Constitutional: Negative for chills, fatigue and fever.  Eyes: Negative for visual disturbance.  Respiratory: Negative for shortness of breath.   Cardiovascular: Negative for chest pain.  Gastrointestinal: Negative for abdominal pain, nausea and vomiting.  Genitourinary: Negative for difficulty urinating, dysuria, flank pain, pelvic pain, vaginal bleeding, vaginal discharge and vaginal pain.   Musculoskeletal: Positive for back pain and myalgias.  Neurological: Negative for dizziness and headaches.  Psychiatric/Behavioral: Negative.      I have reviewed patient's Past Medical Hx, Surgical Hx, Family Hx, Social Hx, medications and allergies.   Physical Exam   Patient Vitals for the past 24 hrs:  BP Pulse SpO2  08/23/20 1310 - - 98 %  08/23/20 1305 - - 98 %  08/23/20 1304 121/69 (!) 132 -   Constitutional: Well-developed, well-nourished female in no acute distress.  Cardiovascular: normal rate Respiratory: normal effort GI: Abd soft, non-tender, gravid appropriate for gestational age.  MS: Extremities nontender, no edema, normal ROM Neurologic: Alert and oriented x 4.  GU: Neg CVAT.  PELVIC EXAM: Deferred  Effacement (%): Thick Cervical Position: Posterior Station: Ballotable Exam by:: Fatima Blank, CNM Effacement (%): Thick Cervical Position: Posterior Station: Ballotable Exam by:: Fatima Blank, CNM   FHT:  Baseline 140 , moderate variability, accelerations present, no decelerations Contractions: None on toco or to palpation   Labs: Results for orders placed or performed during the hospital encounter of 08/23/20 (from the past 24 hour(s))  Urinalysis, Routine w reflex microscopic Urine, Clean Catch     Status: Abnormal   Collection Time: 08/23/20  1:41 PM  Result Value Ref Range   Color, Urine YELLOW YELLOW   APPearance HAZY (A) CLEAR   Specific Gravity, Urine 1.021 1.005 - 1.030   pH 6.0 5.0 - 8.0   Glucose, UA NEGATIVE NEGATIVE mg/dL   Hgb urine dipstick NEGATIVE NEGATIVE   Bilirubin Urine NEGATIVE NEGATIVE   Ketones, ur 80 (A) NEGATIVE mg/dL   Protein, ur NEGATIVE NEGATIVE mg/dL   Nitrite NEGATIVE NEGATIVE   Leukocytes,Ua NEGATIVE NEGATIVE  Resp Panel by RT-PCR (Flu A&B, Covid) Nasopharyngeal Swab     Status: Abnormal   Collection Time: 08/23/20  1:41 PM   Specimen: Nasopharyngeal Swab; Nasopharyngeal(NP) swabs in vial transport  medium  Result Value Ref Range   SARS Coronavirus 2 by RT PCR POSITIVE (A) NEGATIVE   Influenza A by PCR NEGATIVE NEGATIVE   Influenza B by PCR NEGATIVE NEGATIVE  Wet prep, genital     Status: Abnormal   Collection Time: 08/23/20  1:49 PM   Specimen: Vaginal; Genital  Result Value Ref Range   Yeast Wet Prep HPF POC PRESENT (A) NONE SEEN   Trich, Wet Prep NONE SEEN NONE SEEN   Clue Cells Wet Prep HPF POC NONE SEEN NONE SEEN   WBC, Wet Prep HPF POC MANY (A) NONE SEEN   Sperm NONE SEEN    --/--/  O POS (11/15 2052)  Imaging:    MAU Course/MDM: Orders Placed This Encounter  Procedures  . Resp Panel by RT-PCR (Flu A&B, Covid) Nasopharyngeal Swab  . SARS CORONAVIRUS 2 (TAT 6-24 HRS) Nasopharyngeal Nasopharyngeal Swab  . Wet prep, genital  . Urinalysis, Routine w reflex microscopic Urine, Clean Catch  . Airborne and Contact precautions  . Airborne and Contact precautions  . Discharge patient    Meds ordered this encounter  Medications  . cyclobenzaprine (FLEXERIL) tablet 10 mg  . fluticasone (FLONASE) 50 MCG/ACT nasal spray    Sig: Place 2 sprays into both nostrils daily.    Dispense:  9.9 mL    Refill:  1    Order Specific Question:   Supervising Provider    Answer:   EURE, LUTHER H [[id number]]  . albuterol (VENTOLIN HFA) 108 (90 Base) MCG/ACT inhaler    Sig: Inhale 1-2 puffs into the lungs every 6 (six) hours as needed for wheezing or shortness of breath.    Dispense:  6.7 g    Refill:  1    Order Specific Question:   Supervising Provider    Answer:   EURE, LUTHER H [[id number]]  . cyclobenzaprine (FLEXERIL) 10 MG tablet    Sig: Take 1 tablet (10 mg total) by mouth 2 (two) times daily as needed for muscle spasms.    Dispense:  20 tablet    Refill:  0    Order Specific Question:   Supervising Provider    Answer:   Elonda Husky, LUTHER H [[id number]]  . traMADol (ULTRAM) 50 MG tablet    Sig: Take 1-2 tablets (50-100 mg total) by mouth every 6 (six) hours as needed.    Dispense:  10 tablet     Refill:  0    Order Specific Question:   Supervising Provider    Answer:   Elonda Husky, LUTHER H [[id number]]  . terconazole (TERAZOL 7) 0.4 % vaginal cream    Sig: Place 1 applicator vaginally at bedtime.    Dispense:  45 g    Refill:  0    Order Specific Question:   Supervising Provider    Answer:   Tania Ade H [[id number]]     NST reviewed and reactive Pt COVID test positive, likely cause of her body aches Pt dehydrated, encouraged to increase PO fluids Flexeril given in MAU with little improvement Rx for Flexeril and Tramadol x 10 tablets only for pain management Pt initially reported no respiratory symptoms but at time of discharge said she had some scratchy throat and congestion.   Rx for Flonase and albuterol PRN Rx for Terazol 7 for yeast  Message sent to change appt at [facility] tomorrow to virtual Return to MAU as needed for worsening symptoms or emergencies  Assessment: 1. COVID-19 affecting pregnancy in third trimester   2. [redacted] weeks gestation of pregnancy   3. Generalized body aches   4. Body aches   5. Vaginal candidiasis     Plan: Discharge home Labor precautions and fetal kick counts  Follow-up Information    [facility] Follow up.   Why: Please call the office to change your appt on 08/25/19 to virtual, or to reschedule the appointment. Return to MAU with worsening symptoms or for emergencies. Contact information: [address] [address] [phone]             Allergies as of 08/23/2020   No Known Allergies     Medication List  TAKE these medications   albuterol 108 (90 Base) MCG/ACT inhaler Commonly known as: VENTOLIN HFA Inhale 1-2 puffs into the lungs every 6 (six) hours as needed for wheezing or shortness of breath.   Blood Pressure Kit Devi 1 kit by Does not apply route as needed. Large cuff   clobetasol ointment 0.05 % Commonly known as: TEMOVATE Apply 1 application topically 2 (two) times  daily. Do not use for more than 1 - 1.5 weeks at a time   [place] 1 application by Does not apply route daily.   cyclobenzaprine 10 MG tablet Commonly known as: FLEXERIL Take 1 tablet (10 mg total) by mouth 2 (two) times daily as needed for muscle spasms.   famotidine 40 MG tablet Commonly known as: PEPCID Take 1 tablet (40 mg total) by mouth 2 (two) times daily.   fluticasone 50 MCG/ACT nasal spray Commonly known as: FLONASE Place 2 sprays into both nostrils daily.   multivitamin-prenatal 27-0.8 MG Tabs tablet Take 1 tablet by mouth daily at 12 noon.   omeprazole 20 MG capsule Commonly known as: PriLOSEC Take 1 capsule (20 mg total) by mouth 2 (two) times daily before a meal.   ondansetron 4 MG disintegrating tablet Commonly known as: Zofran ODT Take 1 tablet (4 mg total) by mouth every 6 (six) hours as needed for nausea.   terconazole 0.4 % vaginal cream Commonly known as: Terazol 7 Place 1 applicator vaginally at bedtime.   traMADol 50 MG tablet Commonly known as: ULTRAM Take 1-2 tablets (50-100 mg total) by mouth every 6 (six) hours as needed.       Fatima Blank Certified Nurse-Midwife 08/23/2020 4:53 PM

## 2020-08-24 ENCOUNTER — Encounter: Payer: Self-pay | Admitting: Obstetrics and Gynecology

## 2020-08-24 ENCOUNTER — Other Ambulatory Visit: Payer: Self-pay

## 2020-08-24 ENCOUNTER — Telehealth (INDEPENDENT_AMBULATORY_CARE_PROVIDER_SITE_OTHER): Payer: Medicaid Other | Admitting: Obstetrics and Gynecology

## 2020-08-24 DIAGNOSIS — Z3480 Encounter for supervision of other normal pregnancy, unspecified trimester: Secondary | ICD-10-CM

## 2020-08-24 DIAGNOSIS — O99013 Anemia complicating pregnancy, third trimester: Secondary | ICD-10-CM

## 2020-08-24 LAB — GC/CHLAMYDIA PROBE AMP (~~LOC~~) NOT AT ARMC
Chlamydia: NEGATIVE
Comment: NEGATIVE
Comment: NORMAL
Neisseria Gonorrhea: NEGATIVE

## 2020-08-24 NOTE — Progress Notes (Signed)
OBSTETRICS PRENATAL VIRTUAL VISIT ENCOUNTER NOTE  Provider location: Center for [facility] [place]   I connected with Cindy Cruz on 08/24/20 at  9:00 AM EST by MyChart Video Encounter at home and verified that I am speaking with the correct person using two identifiers.   I discussed the limitations, risks, security and privacy concerns of performing an evaluation and management service virtually and the availability of in person appointments. I also discussed with the patient that there may be a patient responsible charge related to this service. The patient expressed understanding and agreed to proceed. Subjective:  Cindy Cruz is a 35 y.o. [id number] at [redacted]w[redacted]d being seen today for ongoing prenatal care.  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  Patient reports body aches and cold like symptoms. Patient denies fever.  Contractions: Not present. Vag. Bleeding: None.  Movement: Present. Denies any leaking of fluid.   The following portions of the patient's history were reviewed and updated as appropriate: allergies, current medications, past family history, past medical history, past social history, past surgical history and problem list.   Objective:  There were no vitals filed for this visit.  Fetal Status:     Movement: Present     General:  Alert, oriented and cooperative. Patient is in no acute distress.  Respiratory: Normal respiratory effort, no problems with respiration noted  Mental Status: Normal mood and affect. Normal behavior. Normal judgment and thought content.  Rest of physical exam deferred due to type of encounter  Imaging: [place] MFM OB FOLLOW UP  Result Date: 08/21/2020 ----------------------------------------------------------------------   OBSTETRICS REPORT                       (Signed Final 08/21/2020 02:56 pm) ---------------------------------------------------------------------- Patient Info  ID #:       [id number]                          D.O.B.:  [birth year] (35 yrs)  Name:       Cindy Cruz                Visit Date: 08/21/2020 02:01 pm ---------------------------------------------------------------------- Performed By  Attending:        Ma Rings MD         Ref. Address:     [facility] for                                                             [facility]                                                             Healthcare  Performed By:     Jenel Lucks     Location:         [facility]                                     [facility] at                                                             [place] [facility]                                                             Women  Referred By:      Griffin Basil MD ---------------------------------------------------------------------- Orders  #  Description                           Code        Ordered By  1  [place] MFM OB FOLLOW UP                   [phone]    YU FANG ----------------------------------------------------------------------  #  Order #                     Accession #                Episode #  1  [id number]                   [id number]                 [id number] ---------------------------------------------------------------------- Indications  Grand multiparity, antepartum                  O09.40  [redacted] weeks gestation of pregnancy                Z3A.32 ---------------------------------------------------------------------- Vital Signs                                                 Height:        5'5" ---------------------------------------------------------------------- Fetal Evaluation  Num Of Fetuses:         1  Fetal Heart Rate(bpm):  144  Cardiac Activity:       Observed  Presentation:           Cephalic  Placenta:                Anterior  P. Cord Insertion:      Previously Visualized  Amniotic Fluid  AFI FV:      Within normal limits  AFI Sum(cm)     %Tile       Largest Pocket(cm)  20.37           77          6.95  RUQ(cm)       RLQ(cm)  LUQ(cm)        LLQ(cm)  4.73          4.29          6.95           4.4 ---------------------------------------------------------------------- Biometry  BPD:        82  mm     G. Age:  33w 0d         54  %    CI:        77.85   %    70 - 86                                                          FL/HC:      20.9   %    19.9 - 21.5  HC:      294.1  mm     G. Age:  32w 3d         13  %    HC/AC:      0.99        0.96 - 1.11  AC:      296.7  mm     G. Age:  33w 5d         80  %    FL/BPD:     74.9   %    71 - 87  FL:       61.4  mm     G. Age:  31w 6d         20  %    FL/AC:      20.7   %    20 - 24  Est. FW:    2086  gm    4 lb 10 oz      52  % ---------------------------------------------------------------------- OB History  Gravidity:    8         Term:   6        Prem:   0        SAB:   1  Living:       6 ---------------------------------------------------------------------- Gestational Age  U/S Today:     32w 5d                                        EDD:   10/11/20  Best:          32w 4d     Det. By:  Previous Ultrasound      EDD:   10/12/20                                      (03/06/20) ---------------------------------------------------------------------- Anatomy  Cranium:               Appears normal         LVOT:                   Previously seen  Cavum:                 Previously seen        Aortic Arch:  Previously seen  Ventricles:            Previously seen        Ductal Arch:            Previously seen  Choroid Plexus:        Previously seen        Diaphragm:              Appears normal  Cerebellum:            Previously seen        Stomach:                Appears normal, left                                                                        sided  Posterior Fossa:        Previously seen        Abdomen:                Appears normal  Nuchal Fold:           Previously seen        Abdominal Wall:         Previously seen  Face:                  Appears normal         Cord Vessels:           Previously seen                         (orbits and profile)  Lips:                  Previously seen        Kidneys:                Appear normal  Palate:                Previously seen        Bladder:                Appears normal  Thoracic:              Appears normal         Spine:                  Previously seen  Heart:                 Appears normal         Upper Extremities:      Previously seen                         (4CH, axis, and                         situs)  RVOT:                  Previously seen        Lower Extremities:      Previously seen  Other:  Heels/feet and open hands/5th digits visualized. Nasal bone  visualized. ---------------------------------------------------------------------- Cervix Uterus Adnexa  Cervix  Not visualized (advanced GA >24wks) ---------------------------------------------------------------------- Comments  This patient was seen for a follow up growth scan due to  grand multiparity.  She denies any problems since her last  exam and reports that she has screened negative for  gestational diabetes in her current pregnancy.  She was informed that the fetal growth and amniotic fluid  level appears appropriate for her gestational age.  As the fetal growth is within normal limits, no further exams  were scheduled in our office. ----------------------------------------------------------------------                   Ma Rings, MD Electronically Signed Final Report   08/21/2020 02:56 pm ----------------------------------------------------------------------   Assessment and Plan:  Pregnancy: F6C1275 at [redacted]w[redacted]d 1. Supervision of other normal pregnancy, antepartum Patient is doing well. Patient diagnosed with covid-19 yesterday Reviewed reasons to  return to MAU Patient plans Nexplanon for contraception Reviewed recent growth ultrasound  2. Anemia affecting pregnancy in third trimester S/p feraheme  Preterm labor symptoms and general obstetric precautions including but not limited to vaginal bleeding, contractions, leaking of fluid and fetal movement were reviewed in detail with the patient. I discussed the assessment and treatment plan with the patient. The patient was provided an opportunity to ask questions and all were answered. The patient agreed with the plan and demonstrated an understanding of the instructions. The patient was advised to call back or seek an in-person office evaluation/go to MAU at [facility] for any urgent or concerning symptoms. Please refer to After Visit Summary for other counseling recommendations.   I provided 15 minutes of face-to-face time during this encounter.  Return in about 2 weeks (around 09/07/2020) for in person, ROB, Low risk.  No future appointments.  Catalina Antigua, MD Center for [employer], [facility]

## 2020-08-24 NOTE — Progress Notes (Signed)
I connected with  Cindy Cruz on 08/24/20 by a video enabled telemedicine application and verified that I am speaking with the correct person using two identifiers.   I discussed the limitations of evaluation and management by telemedicine. The patient expressed understanding and agreed to proceed.  PATIENT: HOME  PROVIDER: [facility]  Mychart OB, Covid-19+.  She is unable to check BP as the Cuff is not with her.

## 2020-09-05 MED ORDER — ONDANSETRON 4 MG PO TBDP: 4.0000 mg | ORAL_TABLET | Freq: Four times a day (QID) | ORAL | 0 refills | Status: DC | PRN

## 2020-09-07 ENCOUNTER — Other Ambulatory Visit: Payer: Self-pay

## 2020-09-07 ENCOUNTER — Ambulatory Visit (INDEPENDENT_AMBULATORY_CARE_PROVIDER_SITE_OTHER): Payer: Medicaid Other

## 2020-09-07 VITALS — BP 126/83 | HR 92 | Wt 206.0 lb

## 2020-09-07 DIAGNOSIS — Z3A35 35 weeks gestation of pregnancy: Secondary | ICD-10-CM

## 2020-09-07 DIAGNOSIS — Z3480 Encounter for supervision of other normal pregnancy, unspecified trimester: Secondary | ICD-10-CM

## 2020-09-07 MED ORDER — FAMOTIDINE 40 MG PO TABS: 40.0000 mg | ORAL_TABLET | Freq: Two times a day (BID) | ORAL | 0 refills | Status: DC

## 2020-09-07 MED ORDER — PRENATAL 27-0.8 MG PO TABS: 1.0000 | ORAL_TABLET | Freq: Every day | ORAL | 2 refills | Status: DC

## 2020-09-07 NOTE — Progress Notes (Signed)
   HIGH-RISK PREGNANCY OFFICE VISIT  Patient name: Cindy Cruz MRN [id number]  Date of birth: 1986-04-15 Chief Complaint:   Routine Prenatal Visit  Subjective:   Cindy Cruz is a 35 y.o. 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  Patient presents today with complaint of fatigue. Patient has no pregnancy related concerns and endorses fetal movement.  She denies contractions and vaginal concerns including discharge, bleeding, leaking, odor, itching, or irritation. She states she was able to complete one iron infusion, but is not taking any iron supplements.  She requests a refill for her "heartburn medication and prenatal pill."   Contractions: Irritability. Vag. Bleeding: None.  Movement: Present.  Reviewed past medical,surgical, social, obstetrical and family history as well as problem list, medications and allergies.  Objective   Vitals:   09/07/20 0947  BP: 126/83  Pulse: 92  Weight: 206 lb (93.4 kg)  Body mass index is 34.28 kg/m.  Total Weight Gain:11 lb (4.99 kg)         Physical Examination:   General appearance: Well appearing, and in no distress  Mental status: Alert, oriented to person, place, and time  Skin: Warm & dry  Cardiovascular: Normal heart rate noted  Respiratory: Normal respiratory effort, no distress  Abdomen: Soft, gravid, nontender, AGA with Fundal height of Fundal Height: 37 cm  Pelvic: Cervical exam deferred           Extremities: Edema: None  Fetal Status: Fetal Heart Rate (bpm): 141  Movement: Present   No results found for this or any previous visit (from the past 24 hour(s)).  Assessment & Plan:  High-risk pregnancy of a 35 y.o., K2H0623 at [redacted]w[redacted]d with an Estimated Date of Delivery:  10/12/20   1. Supervision of other normal pregnancy, antepartum -Anticipatory guidance for upcoming appts. -Patient to next appt in 1 weeks as an in-person visit. -Discussed refills for prenatal and Pepcid. Requests sent to pharmacy. -Discussed and given information regarding an iron rich diet for increasing iron levels.   2. [redacted] weeks gestation of pregnancy -Doing well overall. -Plan for recheck of H/H at next appt. -Discussed potential for eIOL at 39+ weeks with favorable cervix.    Meds: No orders of the defined types were placed in this encounter.  Labs/procedures today:  Lab Orders  No laboratory test(s) ordered today     Reviewed: Preterm labor symptoms and general obstetric precautions including but not limited to vaginal bleeding, contractions, leaking of fluid and fetal movement were reviewed in detail with the patient.  All questions were answered.  Follow-up: Return in about 1 week (around 09/14/2020) for HROB with GBS.  No orders of the defined types were placed in this encounter.  Cherre Robins MSN, CNM 09/07/2020

## 2020-09-07 NOTE — Patient Instructions (Signed)

## 2020-09-07 NOTE — Progress Notes (Signed)
ROB, reports no problems today. 

## 2020-09-25 ENCOUNTER — Other Ambulatory Visit (HOSPITAL_COMMUNITY)
Admission: RE | Admit: 2020-09-25 | Discharge: 2020-09-25 | Disposition: A | Discharge status: Home / Self Care | Payer: Medicaid Other | Source: Ambulatory Visit | Attending: Advanced Practice Midwife | Admitting: Advanced Practice Midwife

## 2020-09-25 ENCOUNTER — Ambulatory Visit (INDEPENDENT_AMBULATORY_CARE_PROVIDER_SITE_OTHER): Payer: Medicaid Other | Admitting: Advanced Practice Midwife

## 2020-09-25 ENCOUNTER — Other Ambulatory Visit: Payer: Self-pay

## 2020-09-25 VITALS — BP 129/87 | HR 97 | Wt 205.0 lb

## 2020-09-25 DIAGNOSIS — O99013 Anemia complicating pregnancy, third trimester: Secondary | ICD-10-CM

## 2020-09-25 DIAGNOSIS — U071 COVID-19: Secondary | ICD-10-CM

## 2020-09-25 DIAGNOSIS — Z3480 Encounter for supervision of other normal pregnancy, unspecified trimester: Secondary | ICD-10-CM | POA: Insufficient documentation

## 2020-09-25 DIAGNOSIS — Z3A37 37 weeks gestation of pregnancy: Secondary | ICD-10-CM

## 2020-09-25 DIAGNOSIS — O98513 Other viral diseases complicating pregnancy, third trimester: Secondary | ICD-10-CM

## 2020-09-25 NOTE — Patient Instructions (Signed)
Things to Try After 37 weeks to Encourage Labor/Get Ready for Labor:   1.  Try the Miles Circuit at www.milescircuit.com daily to improve baby's position and encourage the onset of labor.  2. Walk a little and rest a little every day.  Change positions often.  3. Cervical Ripening: May try one or both a. Red Raspberry Leaf capsules or tea:  two 300mg or 400mg tablets with each meal, 2-3 times a day, or 1-3 cups of tea daily  Potential Side Effects Of Raspberry Leaf:  Most women do not experience any side effects from drinking raspberry leaf tea. However, nausea and loose stools are possible   b. Evening Primrose Oil capsules: take 1 capsule by mouth and place one capsule in the vagina every night.    Some of the potential side effects:  Upset stomach  Loose stools or diarrhea  Headaches  Nausea  4. Sex can also help the cervix ripen and encourage labor onset.    Labor Precautions Reasons to come to [facility]U at [facility]:  1.  Contractions are  5 minutes apart or less, each last 1 minute, these have been going on for 1-2 hours, and you cannot walk or talk during them 2.  You have a large gush of fluid, or a trickle of fluid that will not stop and you have to wear a pad 3.  You have bleeding that is bright red, heavier than spotting--like menstrual bleeding (spotting can be normal in early labor or after a check of your cervix) 4.  You do not feel the baby moving like he/she normally does 

## 2020-09-25 NOTE — Progress Notes (Signed)
+   Fetal movement. No complaints.  

## 2020-09-25 NOTE — Progress Notes (Addendum)
   PRENATAL VISIT NOTE  Subjective:  Cindy Cruz is a 35 y.o. L3Y1017 at [redacted]w[redacted]d being seen today for ongoing prenatal care.  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  Patient reports occasional contractions.  Contractions: Irritability. Vag. Bleeding: None.  Movement: Present. Denies leaking of fluid.   The following portions of the patient's history were reviewed and updated as appropriate: allergies, current medications, past family history, past medical history, past social history, past surgical history and problem list.   Objective:   Vitals:   09/25/20 1126  BP: 129/87  Pulse: 97  Weight: 205 lb (93 kg)    Fetal Status: Fetal Heart Rate (bpm): 131 Fundal Height: 37 cm Movement: Present  Presentation: Vertex  General:  Alert, oriented and cooperative. Patient is in no acute distress.  Skin: Skin is warm and dry. No rash noted.   Cardiovascular: Normal heart rate noted  Respiratory: Normal respiratory effort, no problems with respiration noted  Abdomen: Soft, gravid, appropriate for gestational age.  Pain/Pressure: Present     Pelvic: Cervical exam performed in the presence of a chaperone Dilation: Closed Effacement (%): Thick Station: -3  Extremities: Normal range of motion.  Edema: Trace  Mental Status: Normal mood and affect. Normal behavior. Normal judgment and thought content.   Assessment and Plan:  Pregnancy: P1W2585 at [redacted]w[redacted]d 1. Supervision of other normal pregnancy, antepartum --Anticipatory guidance about next visits/weeks of pregnancy given. --Labor readiness/labor precautions reviewed --Next visit in 1 week in office  - Strep Gp B NAA - Cervicovaginal ancillary only( [place])  2. [redacted] weeks gestation of pregnancy   3. Anemia affecting pregnancy in  third trimester --No s/sx, labs at next visit per pt, consider second iron infusion now   4. COVID-19 affecting pregnancy in third trimester --Antenatal testing for COVID in third trimester, pt symptomatic positive on 08/23/20.   --No COVID symptoms now - [place] MFM FETAL BPP WO NON STRESS; Future  Term labor symptoms and general obstetric precautions including but not limited to vaginal bleeding, contractions, leaking of fluid and fetal movement were reviewed in detail with the patient. Please refer to After Visit Summary for other counseling recommendations.   Return in about 1 week (around 10/02/2020).  Future Appointments  Date Time Provider Department Center  10/02/2020 10:35 AM [place], Wilmer Floor, CNM [facility] None    Sharen Counter, CNM

## 2020-09-26 LAB — CERVICOVAGINAL ANCILLARY ONLY
Chlamydia: NEGATIVE
Comment: NEGATIVE
Comment: NEGATIVE
Comment: NORMAL
Neisseria Gonorrhea: NEGATIVE
Trichomonas: NEGATIVE

## 2020-09-27 LAB — STREP GP B NAA: Strep Gp B NAA: POSITIVE — AB

## 2020-10-02 ENCOUNTER — Encounter: Payer: Medicaid Other | Admitting: Advanced Practice Midwife

## 2020-10-03 ENCOUNTER — Encounter (HOSPITAL_COMMUNITY): Payer: Self-pay | Admitting: Obstetrics & Gynecology

## 2020-10-03 ENCOUNTER — Ambulatory Visit: Payer: Medicaid Other

## 2020-10-03 ENCOUNTER — Other Ambulatory Visit: Payer: Medicaid Other

## 2020-10-03 ENCOUNTER — Inpatient Hospital Stay (HOSPITAL_COMMUNITY)
Admission: AD | Admit: 2020-10-03 | Discharge: 2020-10-05 | DRG: 807 | Disposition: A | Discharge status: Home / Self Care | Payer: Medicaid Other | Attending: Obstetrics & Gynecology | Admitting: Obstetrics & Gynecology

## 2020-10-03 DIAGNOSIS — O4202 Full-term premature rupture of membranes, onset of labor within 24 hours of rupture: Secondary | ICD-10-CM | POA: Diagnosis not present

## 2020-10-03 DIAGNOSIS — Z3493 Encounter for supervision of normal pregnancy, unspecified, third trimester: Secondary | ICD-10-CM

## 2020-10-03 DIAGNOSIS — O134 Gestational [pregnancy-induced] hypertension without significant proteinuria, complicating childbirth: Secondary | ICD-10-CM | POA: Diagnosis present

## 2020-10-03 DIAGNOSIS — F17210 Nicotine dependence, cigarettes, uncomplicated: Secondary | ICD-10-CM | POA: Diagnosis present

## 2020-10-03 DIAGNOSIS — O139 Gestational [pregnancy-induced] hypertension without significant proteinuria, unspecified trimester: Secondary | ICD-10-CM | POA: Diagnosis present

## 2020-10-03 DIAGNOSIS — O99824 Streptococcus B carrier state complicating childbirth: Secondary | ICD-10-CM | POA: Diagnosis present

## 2020-10-03 DIAGNOSIS — O99019 Anemia complicating pregnancy, unspecified trimester: Secondary | ICD-10-CM | POA: Diagnosis present

## 2020-10-03 DIAGNOSIS — Z148 Genetic carrier of other disease: Secondary | ICD-10-CM

## 2020-10-03 DIAGNOSIS — Z641 Problems related to multiparity: Secondary | ICD-10-CM

## 2020-10-03 DIAGNOSIS — D649 Anemia, unspecified: Secondary | ICD-10-CM | POA: Diagnosis present

## 2020-10-03 DIAGNOSIS — O9902 Anemia complicating childbirth: Secondary | ICD-10-CM | POA: Diagnosis present

## 2020-10-03 DIAGNOSIS — O99334 Smoking (tobacco) complicating childbirth: Secondary | ICD-10-CM | POA: Diagnosis present

## 2020-10-03 DIAGNOSIS — O26893 Other specified pregnancy related conditions, third trimester: Secondary | ICD-10-CM | POA: Diagnosis present

## 2020-10-03 DIAGNOSIS — Z3A38 38 weeks gestation of pregnancy: Secondary | ICD-10-CM

## 2020-10-03 DIAGNOSIS — Z8616 Personal history of COVID-19: Secondary | ICD-10-CM | POA: Diagnosis not present

## 2020-10-03 LAB — COMPREHENSIVE METABOLIC PANEL
ALT: 22 U/L (ref 0–44)
ALT: 17 U/L (ref 0–44)
AST: 33 U/L (ref 15–41)
AST: 31 U/L (ref 15–41)
Albumin: 2.9 g/dL — ABNORMAL LOW (ref 3.5–5.0)
Albumin: 2.6 g/dL — ABNORMAL LOW (ref 3.5–5.0)
Alkaline Phosphatase: 133 U/L — ABNORMAL HIGH (ref 38–126)
Alkaline Phosphatase: 112 U/L (ref 38–126)
Anion gap: 10 (ref 5–15)
Anion gap: 9 (ref 5–15)
BUN: 13 mg/dL (ref 6–20)
BUN: 11 mg/dL (ref 6–20)
CO2: 14 mmol/L — ABNORMAL LOW (ref 22–32)
CO2: 18 mmol/L — ABNORMAL LOW (ref 22–32)
Calcium: 9.3 mg/dL (ref 8.9–10.3)
Calcium: 9.2 mg/dL (ref 8.9–10.3)
Chloride: 107 mmol/L (ref 98–111)
Chloride: 106 mmol/L (ref 98–111)
Creatinine, Ser: 1.06 mg/dL — ABNORMAL HIGH (ref 0.44–1.00)
Creatinine, Ser: 0.99 mg/dL (ref 0.44–1.00)
GFR, Estimated: >60 mL/min (ref >60)
GFR, Estimated: >60 mL/min (ref >60)
Glucose, Bld: 88 mg/dL (ref 70–99)
Glucose, Bld: 108 mg/dL — ABNORMAL HIGH (ref 70–99)
Potassium: 3.4 mmol/L — ABNORMAL LOW (ref 3.5–5.1)
Potassium: 3.7 mmol/L (ref 3.5–5.1)
Sodium: 131 mmol/L — ABNORMAL LOW (ref 135–145)
Sodium: 133 mmol/L — ABNORMAL LOW (ref 135–145)
Total Bilirubin: 0.6 mg/dL (ref 0.3–1.2)
Total Bilirubin: 0.7 mg/dL (ref 0.3–1.2)
Total Protein: 7.4 g/dL (ref 6.5–8.1)
Total Protein: 6.4 g/dL — ABNORMAL LOW (ref 6.5–8.1)

## 2020-10-03 LAB — CBC
HCT: 38.1 % (ref 36.0–46.0)
HCT: 35.1 % — ABNORMAL LOW (ref 36.0–46.0)
Hemoglobin: 12.4 g/dL (ref 12.0–15.0)
Hemoglobin: 11.8 g/dL — ABNORMAL LOW (ref 12.0–15.0)
MCH: 27.6 pg (ref 26.0–34.0)
MCH: 28.6 pg (ref 26.0–34.0)
MCHC: 32.5 g/dL (ref 30.0–36.0)
MCHC: 33.6 g/dL (ref 30.0–36.0)
MCV: 84.9 fL (ref 80.0–100.0)
MCV: 85.2 fL (ref 80.0–100.0)
Platelets: 332 10*3/uL (ref 150–400)
Platelets: 269 10*3/uL (ref 150–400)
RBC: 4.49 MIL/uL (ref 3.87–5.11)
RBC: 4.12 MIL/uL (ref 3.87–5.11)
RDW: 20.6 % — ABNORMAL HIGH (ref 11.5–15.5)
RDW: 20.7 % — ABNORMAL HIGH (ref 11.5–15.5)
WBC: 6.2 10*3/uL (ref 4.0–10.5)
WBC: 8.5 10*3/uL (ref 4.0–10.5)
nRBC: 0.3 % — ABNORMAL HIGH (ref 0.0–0.2)
nRBC: 0 % (ref 0.0–0.2)

## 2020-10-03 LAB — RPR: RPR Ser Ql: NONREACTIVE

## 2020-10-03 LAB — TYPE AND SCREEN
ABO/RH(D): O POS
Antibody Screen: NEGATIVE

## 2020-10-03 MED ORDER — FENTANYL CITRATE (PF) 100 MCG/2ML IJ SOLN
100.0000 ug | INTRAMUSCULAR | Status: DC | PRN
Start: 2020-10-03 — End: 2020-10-03
  Administered 2020-10-03: 100 ug via INTRAVENOUS

## 2020-10-03 MED ORDER — MEDROXYPROGESTERONE ACETATE 150 MG/ML IM SUSP
150.0000 mg | Freq: Once | INTRAMUSCULAR | Status: AC
  Administered 2020-10-03: 150 mg via INTRAMUSCULAR
  Filled 2020-10-03: qty 1

## 2020-10-03 MED ORDER — TETANUS-DIPHTH-ACELL PERTUSSIS 5-2.5-18.5 LF-MCG/0.5 IM SUSY: 0.5000 mL | PREFILLED_SYRINGE | Freq: Once | INTRAMUSCULAR | Status: DC

## 2020-10-03 MED ORDER — ONDANSETRON HCL 4 MG/2ML IJ SOLN: 4.0000 mg | INTRAMUSCULAR | Status: DC | PRN

## 2020-10-03 MED ORDER — OXYCODONE HCL 5 MG PO TABS
5.0000 mg | ORAL_TABLET | Freq: Once | ORAL | Status: AC
  Administered 2020-10-03: 5 mg via ORAL
  Filled 2020-10-03: qty 1

## 2020-10-03 MED ORDER — LACTATED RINGERS IV SOLN: 500.0000 mL | INTRAVENOUS | Status: DC | PRN

## 2020-10-03 MED ORDER — SOD CITRATE-CITRIC ACID 500-334 MG/5ML PO SOLN: 30.0000 mL | ORAL | Status: DC | PRN

## 2020-10-03 MED ORDER — COCONUT OIL OIL
1.0000 "application " | TOPICAL_OIL | Status: DC | PRN
  Administered 2020-10-04: 1 via TOPICAL

## 2020-10-03 MED ORDER — SODIUM CHLORIDE 0.9 % IV SOLN
500.0000 mg | Freq: Once | INTRAVENOUS | Status: DC
  Filled 2020-10-03: qty 25

## 2020-10-03 MED ORDER — ONDANSETRON HCL 4 MG/2ML IJ SOLN: 4.0000 mg | Freq: Four times a day (QID) | INTRAMUSCULAR | Status: DC | PRN

## 2020-10-03 MED ORDER — ACETAMINOPHEN 325 MG PO TABS: 650.0000 mg | ORAL_TABLET | ORAL | Status: DC | PRN

## 2020-10-03 MED ORDER — ACETAMINOPHEN 325 MG PO TABS
650.0000 mg | ORAL_TABLET | ORAL | Status: DC
  Administered 2020-10-03 – 2020-10-05 (×9): 650 mg via ORAL
  Filled 2020-10-03 (×10): qty 2

## 2020-10-03 MED ORDER — ZOLPIDEM TARTRATE 5 MG PO TABS
5.0000 mg | ORAL_TABLET | Freq: Every evening | ORAL | Status: DC | PRN
  Administered 2020-10-03 – 2020-10-05 (×2): 5 mg via ORAL
  Filled 2020-10-03 (×2): qty 1

## 2020-10-03 MED ORDER — SODIUM CHLORIDE 0.9 % IV SOLN: 5.0000 10*6.[IU] | Freq: Once | INTRAVENOUS | Status: DC

## 2020-10-03 MED ORDER — LACTATED RINGERS IV SOLN: INTRAVENOUS | Status: DC

## 2020-10-03 MED ORDER — AMLODIPINE BESYLATE 5 MG PO TABS
5.0000 mg | ORAL_TABLET | Freq: Every day | ORAL | Status: DC
  Administered 2020-10-03 – 2020-10-05 (×3): 5 mg via ORAL
  Filled 2020-10-03 (×3): qty 1

## 2020-10-03 MED ORDER — TRANEXAMIC ACID-NACL 1000-0.7 MG/100ML-% IV SOLN
INTRAVENOUS | Status: AC
  Filled 2020-10-03: qty 100

## 2020-10-03 MED ORDER — PRENATAL MULTIVITAMIN CH
1.0000 | ORAL_TABLET | Freq: Every day | ORAL | Status: DC
  Administered 2020-10-03 – 2020-10-04 (×2): 1 via ORAL
  Filled 2020-10-03 (×2): qty 1

## 2020-10-03 MED ORDER — SIMETHICONE 80 MG PO CHEW: 80.0000 mg | CHEWABLE_TABLET | ORAL | Status: DC | PRN

## 2020-10-03 MED ORDER — FENTANYL CITRATE (PF) 100 MCG/2ML IJ SOLN
INTRAMUSCULAR | Status: AC
  Filled 2020-10-03: qty 2

## 2020-10-03 MED ORDER — WITCH HAZEL-GLYCERIN EX PADS: 1.0000 "application " | MEDICATED_PAD | CUTANEOUS | Status: DC | PRN

## 2020-10-03 MED ORDER — LIDOCAINE HCL (PF) 1 % IJ SOLN: 30.0000 mL | INTRAMUSCULAR | Status: DC | PRN

## 2020-10-03 MED ORDER — OXYCODONE-ACETAMINOPHEN 5-325 MG PO TABS: 1.0000 | ORAL_TABLET | ORAL | Status: DC | PRN

## 2020-10-03 MED ORDER — OXYCODONE-ACETAMINOPHEN 5-325 MG PO TABS: 2.0000 | ORAL_TABLET | ORAL | Status: DC | PRN

## 2020-10-03 MED ORDER — SODIUM CHLORIDE 0.9 % IV SOLN
2.0000 g | INTRAVENOUS | Status: DC
  Administered 2020-10-03: 2 g via INTRAVENOUS
  Filled 2020-10-03: qty 2000

## 2020-10-03 MED ORDER — ONDANSETRON HCL 4 MG PO TABS: 4.0000 mg | ORAL_TABLET | ORAL | Status: DC | PRN

## 2020-10-03 MED ORDER — DIPHENHYDRAMINE HCL 25 MG PO CAPS
25.0000 mg | ORAL_CAPSULE | Freq: Four times a day (QID) | ORAL | Status: DC | PRN
Start: 2020-10-03 — End: 2020-10-05
  Administered 2020-10-03: 25 mg via ORAL
  Filled 2020-10-03: qty 1

## 2020-10-03 MED ORDER — BENZOCAINE-MENTHOL 20-0.5 % EX AERO
1.0000 "application " | INHALATION_SPRAY | CUTANEOUS | Status: DC | PRN
  Administered 2020-10-03: 1 via TOPICAL
  Filled 2020-10-03: qty 56

## 2020-10-03 MED ORDER — SENNOSIDES-DOCUSATE SODIUM 8.6-50 MG PO TABS
2.0000 | ORAL_TABLET | Freq: Every day | ORAL | Status: DC
  Administered 2020-10-04 – 2020-10-05 (×2): 2 via ORAL
  Filled 2020-10-03 (×2): qty 2

## 2020-10-03 MED ORDER — OXYTOCIN BOLUS FROM INFUSION
333.0000 mL | Freq: Once | INTRAVENOUS | Status: AC
  Administered 2020-10-03: 333 mL via INTRAVENOUS

## 2020-10-03 MED ORDER — PENICILLIN G POT IN DEXTROSE 60000 UNIT/ML IV SOLN: 3.0000 10*6.[IU] | INTRAVENOUS | Status: DC

## 2020-10-03 MED ORDER — IBUPROFEN 600 MG PO TABS
600.0000 mg | ORAL_TABLET | Freq: Four times a day (QID) | ORAL | Status: DC
  Administered 2020-10-03 – 2020-10-05 (×8): 600 mg via ORAL
  Filled 2020-10-03 (×9): qty 1

## 2020-10-03 MED ORDER — DIBUCAINE (PERIANAL) 1 % EX OINT: 1.0000 "application " | TOPICAL_OINTMENT | CUTANEOUS | Status: DC | PRN

## 2020-10-03 MED ORDER — OXYTOCIN-SODIUM CHLORIDE 30-0.9 UT/500ML-% IV SOLN
2.5000 [IU]/h | INTRAVENOUS | Status: DC
  Administered 2020-10-03: 2.5 [IU]/h via INTRAVENOUS
  Filled 2020-10-03: qty 500

## 2020-10-03 NOTE — MAU Note (Signed)
Patient reports to MAU via EMS with contractions that began getting stronger around 2200.  States they were 20 or 30 minutes apart but then started getting closer.  Denis vag. Bleeding, leaking of fluid, and states she has felt baby but less than usual.

## 2020-10-03 NOTE — Discharge Summary (Addendum)
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 yo [id number] 362w5das admitted in Active Labor on 10/03/2020. Patient had an uncomplicated labor course as follows:  Membrane Rupture Time/Date: 4:11 AM ,10/03/2020   Delivery Method:Vaginal, Spontaneous  Episiotomy: None  Lacerations:  None  Patient had a postpartum course remarkable for having mild range BP elevations requiring the start of Norvasc 64m264mHer initial creatine was elevated at 1.06 with f/u of 0.99.  She is ambulating, tolerating a regular diet, passing flatus, and urinating well. Patient is discharged home in stable condition on 10/05/20.  Newborn Data: Birth date:10/03/2020  Birth time:4:14 AM  Gender:Female  Living status:Living  Apgars:8 ,9  Weight:3005 g (6lb 10oz)  Magnesium Sulfate received: No BMZ received: No Rhophylac:N/A MMR:N/A T-DaP:Given prenatally Flu: Yes Transfusion: IV iron during pregnancy  Physical exam  Vitals:   10/04/20 0545 10/04/20 1430 10/04/20 2126 10/05/20 0600  BP: (!) 121/101 (!) 138/93 111/75 (!) 133/95  Pulse: 83 77 73 83  Resp: 18 18 17 18   Temp: 98.1 F  (36.7 C) 98.2 F (36.8 C) 98 F (36.7 C) 98.1 F (36.7 C)  TempSrc: Oral Oral Oral Oral  SpO2:  100% 100%   Weight:      Height:       General: alert, cooperative and no distress Lochia: appropriate Uterine Fundus: firm Incision: Healing well with no significant drainage DVT Evaluation: No evidence of DVT seen on physical exam. Labs: Lab Results  Component Value Date   WBC 8.5 10/03/2020   HGB 11.8 (L) 10/03/2020   HCT 35.1 (L) 10/03/2020   MCV 85.2 10/03/2020   PLT 269 10/03/2020   CMP Latest Ref Rng & Units 10/03/2020  Glucose 70 - 99 mg/dL 108(H)  BUN 6 - 20 mg/dL 11  Creatinine 0.44 - 1.00 mg/dL 0.99  Sodium 135 - 145 mmol/L 133(L)  Potassium 3.5 - 5.1 mmol/L 3.7  Chloride 98 - 111 mmol/L 106  CO2 22 - 32 mmol/L 18(L)  Calcium 8.9 - 10.3 mg/dL 9.2  Total Protein 6.5 - 8.1 g/dL 6.4(L)  Total Bilirubin 0.3 - 1.2 mg/dL 0.7  Alkaline Phos 38 - 126 U/L 112  AST 15 - 41 U/L 31  ALT 0 - 44 U/L 17   Edinburgh Score: Edinburgh Postnatal Depression Scale Screening Tool 10/04/2020  I have been able to laugh and see the funny side of things. 0  I have looked forward with enjoyment to things. 1  I have blamed myself unnecessarily when things went wrong. 1  I have been  anxious or worried for no good reason. 2  I have felt scared or panicky for no good reason. 0  Things have been getting on top of me. 1  I have been so unhappy that I have had difficulty sleeping. 1  I have felt sad or miserable. 1  I have been so unhappy that I have been crying. 0  The thought of harming myself has occurred to me. 0  Edinburgh Postnatal Depression Scale Total 7     After visit meds:  Allergies as of 10/05/2020   No Known Allergies     Medication List    STOP taking these medications   albuterol 108 (90 Base) MCG/ACT inhaler Commonly known as: VENTOLIN HFA   Blood Pressure Kit Devi   clobetasol ointment 0.05 % Commonly known as: [place] Supp Lg Misc    cyclobenzaprine 10 MG tablet Commonly known as: FLEXERIL   fluticasone 50 MCG/ACT nasal spray Commonly known as: FLONASE   terconazole 0.4 % vaginal cream Commonly known as: Terazol 7   traMADol 50 MG tablet Commonly known as: ULTRAM     TAKE these medications   amLODipine 5 MG tablet Commonly known as: NORVASC Take 1 tablet (5 mg total) by mouth daily.   famotidine 40 MG tablet Commonly known as: PEPCID Take 1 tablet (40 mg total) by mouth 2 (two) times daily.   ibuprofen 600 MG tablet Commonly known as: ADVIL Take 1 tablet (600 mg total) by mouth every 6 (six) hours.   multivitamin-prenatal 27-0.8 MG Tabs tablet Take 1 tablet by mouth daily at 12 noon.   omeprazole 20 MG capsule Commonly known as: PriLOSEC Take 1 capsule (20 mg total) by mouth 2 (two) times daily before a meal.   ondansetron 4 MG disintegrating tablet Commonly known as: Zofran ODT Take 1 tablet (4 mg total) by mouth every 6 (six) hours as needed for nausea.        Discharge home in stable condition Infant Feeding: breast & bottle Infant Disposition:home with mother Discharge instruction: per After Visit Summary and Postpartum booklet. Activity: Advance as tolerated. Pelvic rest for 6 weeks.  Diet: routine diet Future Appointments: Future Appointments  Date Time Provider [place]  10/10/2020  1:20 PM [place] None  11/02/2020 10:15 AM Gavin Pound, CNM [facility] None   Follow up Visit: Message sent to [facility] on 10/03/20 to schedule PP appt.  Please schedule this patient for a In person postpartum visit in 6 weeks with the following provider: Any provider. Additional Postpartum F/U:BP check 1 week (BP elevated on admission to L&D) Low risk pregnancy complicated by: anemia s/p IV iron in pregnancy, Carrier for Duchenne Muscular Dystrophy  Delivery mode:  Vaginal, Spontaneous  Anticipated Birth Control:  PP Depo given   10/05/2020 Alcus Dad, MD  CNM  attestation I have seen and examined this patient and agree with above documentation in the resident's note.   Cindy Cruz is a 35 y.o. [phone] s/p vag del.   Pain is well controlled.  Plan for birth control is Depo-Provera- given inpatient.  Method of Feeding: both  PE:  BP (!) 133/95 (BP Location: Left Arm)   Pulse 83   Temp 98.1 F (36.7 C) (Oral)   Resp 18   Ht 5' 5"  (1.651 m)   Wt 93 kg   LMP 12/30/2019   SpO2 100%   Breastfeeding Unknown   BMI 34.12 kg/m  Fundus firm  Recent Labs  10/03/20 0318 10/03/20 0902  HGB 12.4 11.8*  HCT 38.1 35.1*     Plan: discharge today - postpartum care discussed - f/u clinic in 1wk BP check, then 4wks weeks for postpartum visit   Myrtis Ser, CNM 9:32 AM  10/05/2020

## 2020-10-03 NOTE — MAU Note (Signed)
...  Cindy Cruz is a 35 y.o. at [redacted]w[redacted]d here in MAU via EMS reporting: CTX that began last night but increased in intensity over the past few hours. Patient reports she has not felt the baby move as much as normal. Reassuring FHR at 145 initially. She reports her CTX have been 3-4 minutes apart. States she has not been feeling fetal movement since yesterday.  GBS+. [facility].

## 2020-10-03 NOTE — Lactation Note (Signed)
This note was copied from a baby's chart. Lactation Consultation Note  Patient Name: [name] QBHAL'P Date: 10/03/2020 Reason for consult: Initial assessment;Early term 37-38.6wks Age:34 hours   P7 mother whose infant is now 7 hours old.  This is an ETI at 38+5 weeks.  Mother's feeding preference is breast/bottle.  Baby was asleep STS on mother's chest when I arrived.  She stated that she breast fed a little bit with her other children, however, she would probably need assistance latching.  I offered to return at any time to assist with latching.  Discussed baby's tummy size and suggested mother provide colostrum whenever baby shows cues rather than giving any formula at this time.  Mother verbalized understanding.    Encouraged to feed 8-12 times/24 hours or sooner if baby shows feeding cues.  Reviewed cues with mother.  She has been taught hand expression.  Colostrum container provided and milk storage times reviewed.  Finger feeding demonstrated.  Mom made aware of O/P services, breastfeeding support groups, community resources, and our phone # for post-discharge questions.   Mother asked if I would swaddle baby and place him in the bassinet.  Demonstrated swaddling.  Mother requested pain medicine; informed RN.  Provided juice at her request.  Mother on the phone with the baby's father during my visit.  He has arrived and will be checking in at the security desk momentarily.     Maternal Data Has patient been taught Hand Expression?: Yes Does the patient have breastfeeding experience prior to this delivery?: Yes How long did the patient breastfeed?: "little bit" with her other chidren; youngest one is two years old  Feeding Mother's Current Feeding Choice: Breast Milk and Formula  LATCH Score                    Lactation Tools Discussed/Used    Interventions    Discharge    Consult Status Consult Status: Follow-up Date: 10/04/20 Follow-up type:  In-patient    Cindy Cruz 10/03/2020, 7:26 AM

## 2020-10-03 NOTE — Lactation Note (Addendum)
This note was copied from a baby's chart. Lactation Consultation Note  Patient Name: Boy Hydee Fleece RDEYC'X Date: 10/03/2020 Reason for consult: Initial assessment;Early term 37-38.6wks Age:34 hours   LC Follow Up Visit:  Mother requested latch assistance.  When I arrived mother had baby swaddled, latched and covered with two additional blankets.  Baby was not effectively sucking.  Reviewed breast feeding basics including feeding STS, hand expression, feeding cues and how to keep baby awake at the breast.  Asked permission to remove all blankets and demonstrated how to effectively awaken baby.  Taught hand expression and finger fed two drops of colostrum to baby.  Assisted to latch in the cross cradle hold easily.  With gentle stimulation baby began actively sucking.  Demonstrated breast compressions and stimulation to keep him awake.  Mother felt a tug at her breast and denied pain stating that felt "much better."  Provided additional pillow support.  Mother received a phone call during my visit.  I left the room after 8 minutes of assistance.   Maternal Data Has patient been taught Hand Expression?: Yes Does the patient have breastfeeding experience prior to this delivery?: Yes How long did the patient breastfeed?: "little bit" with her other chidren; youngest one is two years old  Feeding Mother's Current Feeding Choice: Breast Milk and Formula  LATCH Score Latch: Grasps breast easily, tongue down, lips flanged, rhythmical sucking.  Audible Swallowing: None  Type of Nipple: Everted at rest and after stimulation  Comfort (Breast/Nipple): Soft / non-tender  Hold (Positioning): Assistance needed to correctly position infant at breast and maintain latch.  LATCH Score: 7   Lactation Tools Discussed/Used    Interventions Interventions: Breast feeding basics reviewed;Assisted with latch;Skin to skin;Breast massage;Hand express;Breast compression;Adjust position;Position  options;Support pillows;Education  Discharge    Consult Status Consult Status: Follow-up Date: 10/04/20 Follow-up type: In-patient    English Tomer R Ree Alcalde 10/03/2020, 10:09 AM

## 2020-10-03 NOTE — Social Work (Signed)
MOB was referred for history of depression and anxiety.   * Referral screened out by Clinical Social Worker because none of the following criteria appear to apply:  ~ History of anxiety/depression during this pregnancy, or of post-partum depression following prior delivery. ~ Diagnosis of anxiety and/or depression within last 3 years. Per chart review, MOB had diagnosis in 2015.  OR * MOB's symptoms currently being treated with medication and/or therapy.  Please contact the Clinical Social Worker if needs arise, by [facility] request, or if MOB scores greater than 9/yes to question 10 on Edinburgh Postpartum Depression Screen.  Manfred Arch, LCSWA Clinical Social Work [employer] and [employer]  [phone]

## 2020-10-03 NOTE — H&P (Signed)
OBSTETRIC ADMISSION HISTORY AND PHYSICAL  Cindy Cruz is a 35 y.o. female [phone] with IUP at 70w5dby L/8 presenting for spontaneous onset of labor. She reports +FMs, No LOF, no VB, no blurry vision, headaches or peripheral edema, and RUQ pain.  She plans on breast and bottle feeding. She requests depo for birth control.  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 Depression    ok now  . GERD (gastroesophageal reflux disease)    indigestion with pregnancy only; Rx med helps; pt cannot remember name of med  . Trichomoniasis     Past Surgical History: Past Surgical History:  Procedure Laterality Date  . WISDOM TOOTH EXTRACTION      Obstetrical History: OB History    Gravida  8   Para  6   Term  6   Preterm  0   AB  1   Living  6     SAB  0   IAB  0   Ectopic  0   Multiple  0   Live Births  6           Social History Social History   Socioeconomic History  . Marital status: Single    Spouse name: Not on file  . Number of children: 6  . Years of education: 150 . Highest education level: 12th grade  Occupational History  . Not on file  Tobacco Use  . Smoking status: Light Tobacco Smoker    Packs/day: 0.25    Years: 10.00    Pack years: 2.50    Types: Cigarettes  . Smokeless tobacco: Never Used  Vaping Use  . Vaping Use: Never used  Substance and Sexual Activity  . Alcohol use: Not Currently    Comment: socially on occ.-liquor  . Drug use: No  . Sexual activity: Yes    Birth control/protection: None  Other Topics Concern  . Not on  file  Social History Narrative  . Not on file   Social Determinants of Health   Financial Resource Strain: Not on file  Food Insecurity: Not on file  Transportation Needs: Not on file  Physical Activity: Not on file  Stress: Not on file  Social Connections: Not on file    Family History: Family History  Problem Relation Age of Onset  . Hypertension Mother   . Cancer Maternal Aunt        breast  . Hearing loss Neg Hx     Allergies: No Known Allergies  Medications Prior to Admission  Medication Sig Dispense Refill Last Dose  . famotidine (PEPCID) 40 MG tablet Take 1 tablet (40 mg total) by mouth 2 (two) times daily. 60 tablet 0 10/02/2020 at Unknown time  . Prenatal Vit-Fe Fumarate-FA (MULTIVITAMIN-PRENATAL) 27-0.8 MG TABS tablet Take 1 tablet by mouth daily at 12 noon. 90 tablet 2 10/02/2020 at Unknown time  . albuterol (VENTOLIN HFA) 108 (90 Base) MCG/ACT inhaler Inhale 1-2 puffs into the lungs every 6 (six) hours as needed for wheezing or shortness of breath. (Patient not taking: Reported on 09/25/2020) 6.7 g 1   .  Blood Pressure Monitoring (BLOOD PRESSURE KIT) DEVI 1 kit by Does not apply route as needed. Large cuff 1 each 0   . clobetasol ointment (TEMOVATE) 1.61 % Apply 1 application topically 2 (two) times daily. Do not use for more than 1 - 1.5 weeks at a time (Patient not taking: No sig reported) 80 g 0   . cyclobenzaprine (FLEXERIL) 10 MG tablet Take 1 tablet (10 mg total) by mouth 2 (two) times daily as needed for muscle spasms. 20 tablet 0   . Elastic Bandages & Supports (COMFORT FIT MATERNITY SUPP LG) MISC 1 application by Does not apply route daily. 1 each 0   . fluticasone (FLONASE) 50 MCG/ACT nasal spray Place 2 sprays into both nostrils daily. (Patient not taking: Reported on 09/25/2020) 9.9 mL 1   . omeprazole (PRILOSEC) 20 MG capsule Take 1 capsule (20 mg total) by mouth 2 (two) times daily before a meal. (Patient not taking: No sig reported) 60 capsule 5   .  ondansetron (ZOFRAN ODT) 4 MG disintegrating tablet Take 1 tablet (4 mg total) by mouth every 6 (six) hours as needed for nausea. (Patient not taking: Reported on 09/25/2020) 20 tablet 0   . terconazole (TERAZOL 7) 0.4 % vaginal cream Place 1 applicator vaginally at bedtime. (Patient not taking: Reported on 09/25/2020) 45 g 0   . traMADol (ULTRAM) 50 MG tablet Take 1-2 tablets (50-100 mg total) by mouth every 6 (six) hours as needed. (Patient not taking: Reported on 09/25/2020) 10 tablet 0    Review of Systems   All systems reviewed and negative except as stated in HPI  Blood pressure 135/86, pulse 97, temperature 98 F (36.7 C), temperature source Oral, resp. rate 18, last menstrual period 12/30/2019, SpO2 95 %. General appearance: alert, cooperative and appears stated age Lungs: normal WOB Heart: regular rate Abdomen: soft, non-tender Extremities: no sign of DVT Presentation: cephalic  Fetal monitoringBaseline: 150 bpm, Variability: Good {> 6 bpm), Accelerations: Reactive and Decelerations: recurrent deep variable decelerations with contractions Uterine activity: contractions every 2-3 minutes Dilation: 2.5 Effacement (%): 90 Station: -2 Exam by:: Cindy Ned, Cindy Cruz  Prenatal labs: ABO, Rh: --/--/O POS (11/15 2052) Antibody: NEG (11/15 2052) Rubella: 1.48 (08/02 1108) RPR: Non Reactive (11/24 1118)  HBsAg: Negative (08/02 1108)  HIV: Non Reactive (11/24 1118)  GBS: Positive/-- (02/07 1238)  2 hr Glucola wnl Genetic screening: wnl except for carrier for Duchenne Muscular Dystrophy Anatomy US wnl  Prenatal Transfer Tool  Maternal Diabetes: No Genetic Screening: Normal except for carrier for Duchenne Muscular Dystrophy Maternal Ultrasounds/Referrals: Normal Fetal Ultrasounds or other Referrals:  None Maternal Substance Abuse:  No Significant Maternal Medications:  None Significant Maternal Lab Results: Group B Strep positive  No results found for this or any previous visit (from  the past 24 hour(s)).  Patient Active Problem List   Diagnosis Date Noted  . Anemia affecting pregnancy 07/17/2020  . 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 Supervision of other normal pregnancy, antepartum 03/20/2020  . [place] multipara 03/31/2018    Assessment/Plan:  SINCERITY CEDAR is a 35 y.o. [id number] at 20w5dhere for spontaneous onset of labor with Category 2 strip on arrival to [facility]U.  #Labor: Will manage expectantly and augment as clinically indicated. Given recurrent deep variables will consider AROM with placement of IUPC for possible amnioinfusion. #Pain: TBD per pt request #FWB: Category 2 strip given deep recurrent variable decelerations with contractions. Reassuringly, moderate variability with good recovery s/p contractions #ID: GBS positive;  will initiate PCN on admission #MOF: breast & bottle #MOC: depo #Circ: desired #Elevated blood pressures: on admit. No prior diagnosis of HTN in pregnancy. Asymptomatic. Will f/u preeclampsia labs on admission and continue to monitor. #Anemia: s/p IV iron in pregnancy. F/u CBC on admission. Will plan for TXA at time of delivery. #Carrier for Duchenne Muscular Dystrophy: peds aware  Randa Ngo, MD  10/03/2020, 3:26 AM

## 2020-10-03 NOTE — Discharge Instructions (Signed)

## 2020-10-04 ENCOUNTER — Encounter (HOSPITAL_COMMUNITY): Payer: Self-pay | Admitting: Obstetrics & Gynecology

## 2020-10-04 MED ORDER — OXYCODONE HCL 5 MG PO TABS
10.0000 mg | ORAL_TABLET | ORAL | Status: DC | PRN
  Administered 2020-10-04 – 2020-10-05 (×3): 10 mg via ORAL
  Filled 2020-10-04 (×3): qty 2

## 2020-10-04 MED ORDER — OXYCODONE HCL 5 MG PO TABS
5.0000 mg | ORAL_TABLET | ORAL | Status: DC | PRN
  Administered 2020-10-04: 5 mg via ORAL
  Filled 2020-10-04: qty 1

## 2020-10-04 MED ORDER — CLOBETASOL PROPIONATE 0.05 % EX OINT
TOPICAL_OINTMENT | Freq: Two times a day (BID) | CUTANEOUS | Status: DC
  Filled 2020-10-04: qty 15

## 2020-10-04 NOTE — Progress Notes (Signed)
Post Partum Day 1 Subjective: no complaints, voiding, tolerating PO and + flatus  Objective: Blood pressure (!) 138/93, pulse 77, temperature 98.2 F (36.8 C), temperature source Oral, resp. rate 18, height 5\' 5"  (1.651 m), weight 93 kg, last menstrual period 12/30/2019, SpO2 100 %, unknown if currently breastfeeding.  Physical Exam:  General: alert, cooperative and no distress Lochia: appropriate Uterine Fundus: firm Incision: n/a DVT Evaluation: No evidence of DVT seen on physical exam.  Recent Labs    10/03/20 0318 10/03/20 0902  HGB 12.4 11.8*  HCT 38.1 35.1*    Assessment/Plan: Plan for discharge tomorrow   LOS: 1 day   10/05/20 10/04/2020, 4:07 PM

## 2020-10-04 NOTE — Lactation Note (Signed)
This note was copied from a baby's chart. Lactation Consultation Note  Patient Name: Cindy Cruz NVBTY'O Date: 10/04/2020 Reason for consult: Follow-up assessment Age:34 hours  P7 mother whose infant is now 27 hours old.  This is an ETI at 38+5 weeks.  Mother's feeding preference is breast/bottle.  Baby was latched in the side lying position when I arrived.  Mother had him dressed and swaddled.  He appeared to be latched correctly, however, I did suggest that she feed him STS for a better latch and to help keep him awake.  We discussed this yesterday in the consult.  Mother denied pain with latching and had him supported with a roll behind his back.  Mother asked when she can begin bottle feeding.  I suggested she continue to exclusively breast feed only for at least two weeks before introducing an artificial nipple.  Mother satisfied with this answer.  She informed me that she wants to "do both" which was stated in her feeding preference on admission.  Mother will call for any questions/concerns.  No support person present at this time.    Maternal Data    Feeding    LATCH Score Latch: Grasps breast easily, tongue down, lips flanged, rhythmical sucking.  Audible Swallowing: A few with stimulation  Type of Nipple: Everted at rest and after stimulation  Comfort (Breast/Nipple): Soft / non-tender  Hold (Positioning): No assistance needed to correctly position infant at breast.  LATCH Score: 9   Lactation Tools Discussed/Used    Interventions Interventions: Breast feeding basics reviewed;Skin to skin;Education  Discharge    Consult Status Consult Status: Follow-up Date: 10/05/20 Follow-up type: In-patient    Cindy Cruz 10/04/2020, 11:24 AM

## 2020-10-05 ENCOUNTER — Other Ambulatory Visit: Payer: Self-pay | Admitting: Advanced Practice Midwife

## 2020-10-05 DIAGNOSIS — O139 Gestational [pregnancy-induced] hypertension without significant proteinuria, unspecified trimester: Secondary | ICD-10-CM | POA: Diagnosis present

## 2020-10-05 MED ORDER — AMLODIPINE BESYLATE 5 MG PO TABS: 5.0000 mg | ORAL_TABLET | Freq: Every day | ORAL | 2 refills | Status: DC

## 2020-10-05 MED ORDER — IBUPROFEN 600 MG PO TABS: 600.0000 mg | ORAL_TABLET | Freq: Four times a day (QID) | ORAL | 0 refills | Status: DC

## 2020-10-05 MED ORDER — AMLODIPINE BESYLATE 5 MG PO TABS: 5.0000 mg | ORAL_TABLET | Freq: Every day | ORAL | 0 refills | Status: DC

## 2020-10-05 MED FILL — IBUPROFEN 600 MG TABLET: 600 | 8 days supply | Qty: 30 | Fill #0

## 2020-10-05 MED FILL — AMLODIPINE BESYLATE 5 MG TA: 5 | 30 days supply | Qty: 30 | Fill #0

## 2020-10-05 NOTE — Lactation Note (Signed)
This note was copied from a baby's chart. Lactation Consultation Note  Patient Name: Cindy Cruz [id number]'Y Date: 10/05/2020 Reason for consult: Follow-up assessment;Early term 37-38.6wks Age:34 hours Per mom last fed at 7 am to 9 am.  Mom plans in to breast feed.  LC reviewed supply and demand and the importance of giving the baby  Practice at the breast.  LC recommended prior to latching - offer both breast and then if still hungry keep the supplementing 30 ml or less.  See below for teaching.  LC provided the [facility] brochure with resource numbers.  Maternal Data    Feeding Mother's Current Feeding Choice: Breast Milk and Formula  LATCH Score                    Lactation Tools Discussed/Used Tools: Pump;Flanges Flange Size: 27;24 Breast pump type: Manual Pump Education: Milk Storage;Setup, frequency, and cleaning Reason for Pumping: PRN  Interventions Interventions: Breast feeding basics reviewed;Hand pump;Education  Discharge Discharge Education: Warning signs for feeding baby;Engorgement and breast care Pump: Manual WIC Program: Yes  Consult Status Consult Status: Complete Date: 10/05/20    Kathrin Greathouse 10/05/2020, 9:12 AM

## 2020-10-09 ENCOUNTER — Encounter: Payer: Self-pay | Admitting: Advanced Practice Midwife

## 2020-10-09 DIAGNOSIS — O99820 Streptococcus B carrier state complicating pregnancy: Secondary | ICD-10-CM | POA: Insufficient documentation

## 2020-11-02 ENCOUNTER — Ambulatory Visit: Payer: Medicaid Other

## 2021-06-11 IMAGING — US US OB COMP LESS 14 WK
1 series · 15 of 28 positions shown · non-contrast
Comparison: 02/11/2020

CLINICAL DATA: Pregnant, assess fetal viability, low abdominal pain

EXAM:
OBSTETRIC <14 WK ULTRASOUND
TECHNIQUE: Transabdominal ultrasound was performed for evaluation of the
gestation as well as the maternal uterus and adnexal regions.

[Series 1: us ob comp less 14 wk · 32 acquisitions, 15 frames shown]
[im 1/32]
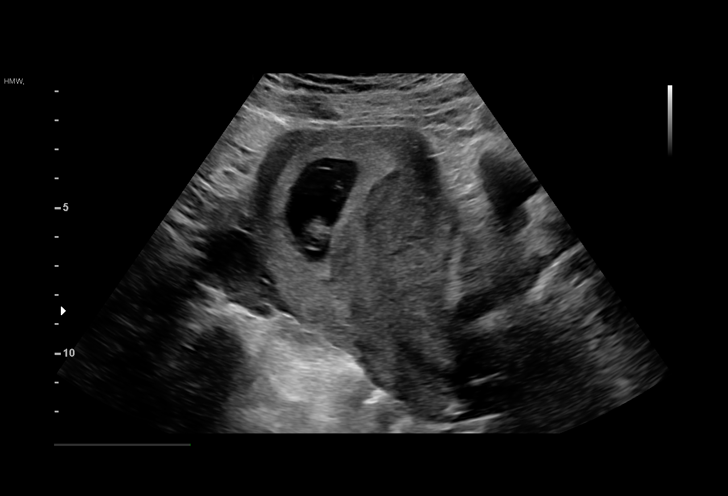
[im 3/32]
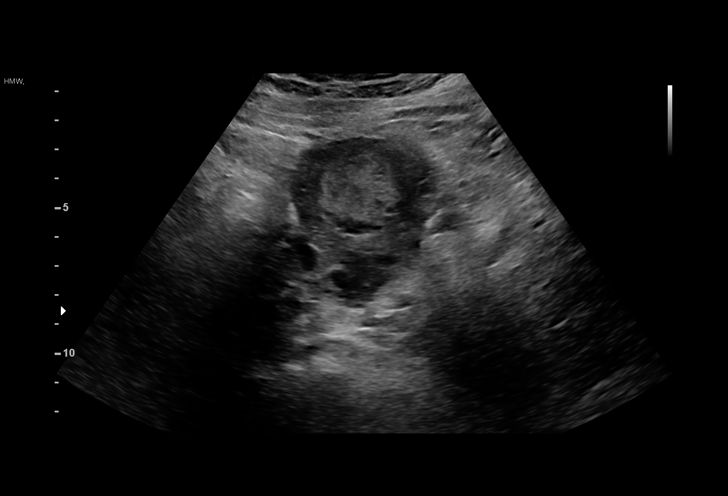
[im 5/32]
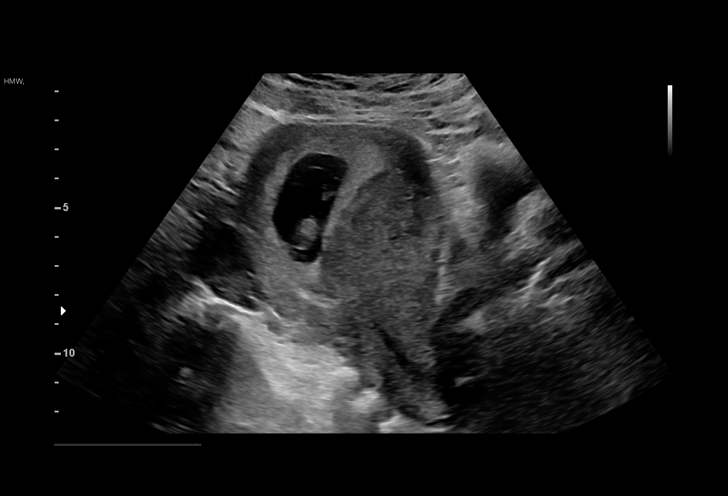
[im 7/32]
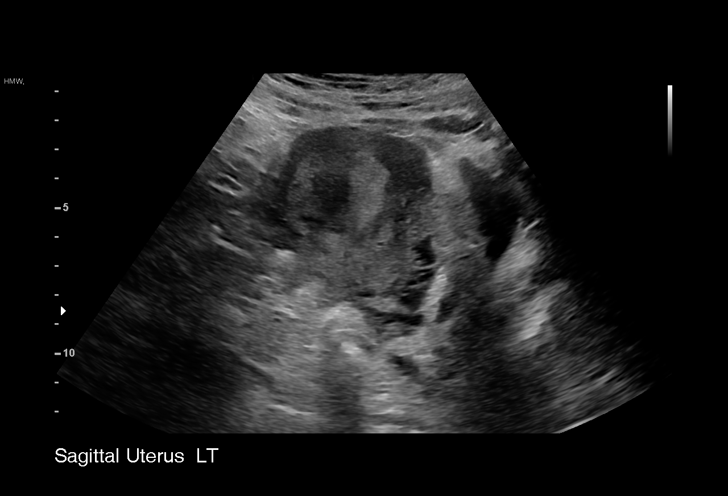
[im 10/32]
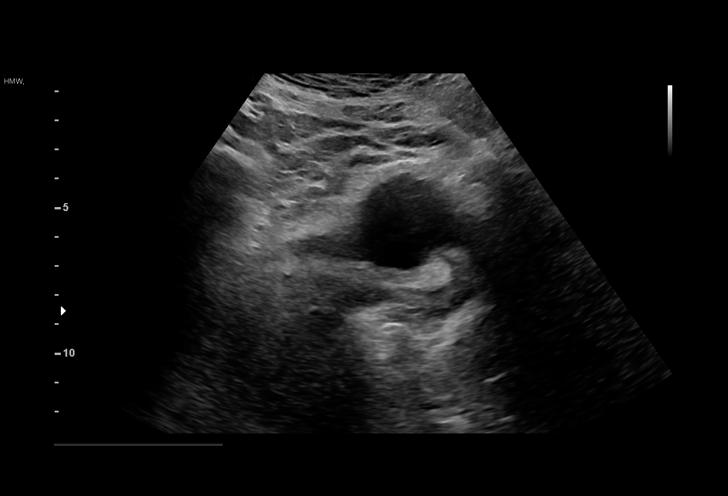
[im 12/32]
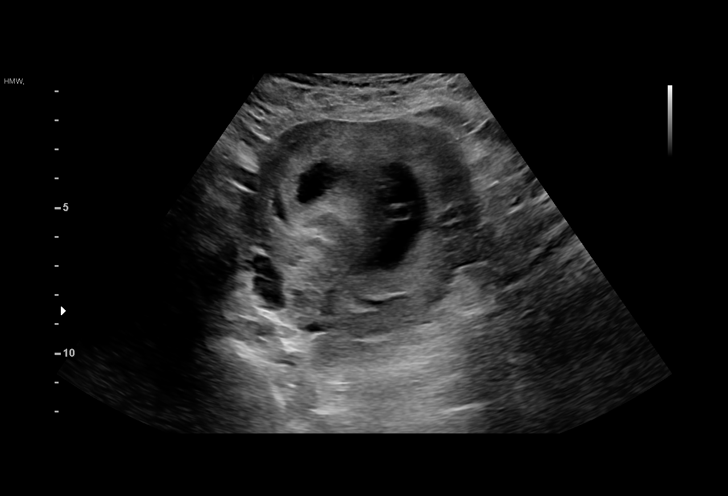
[im 14/32]
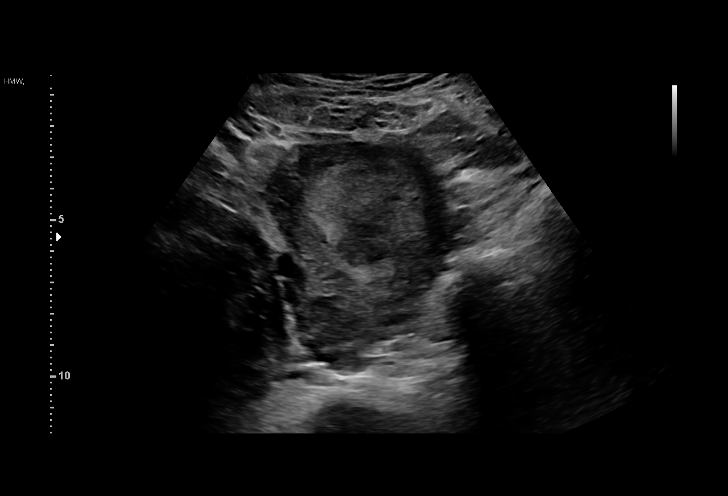
[im 17/32]
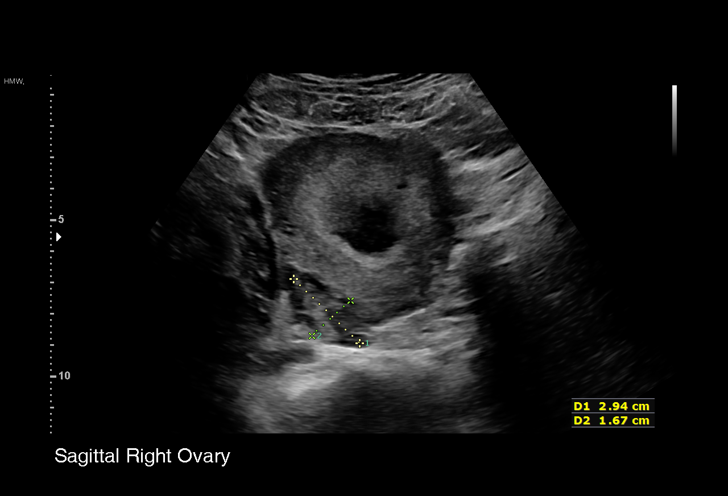
[im 18/32]
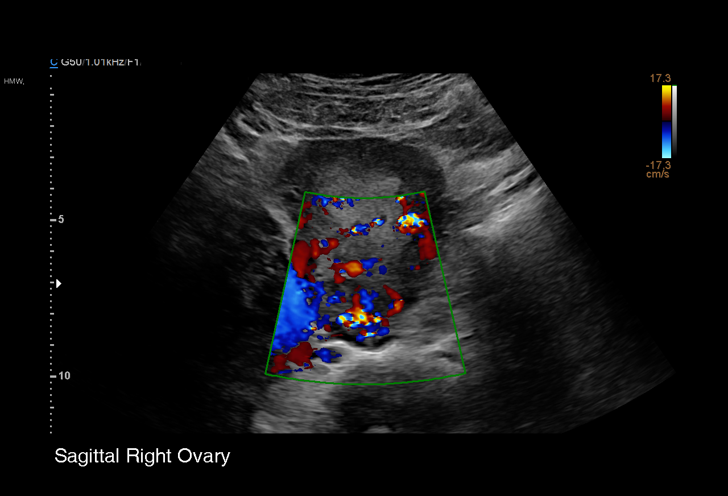
[im 20/32]
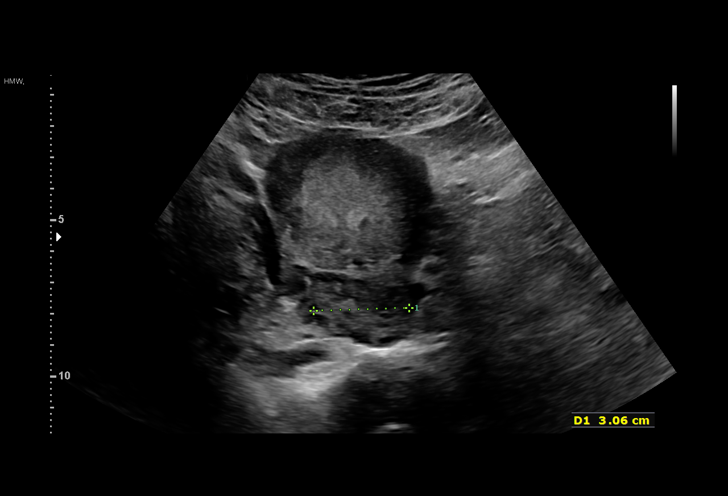
[im 22/32]
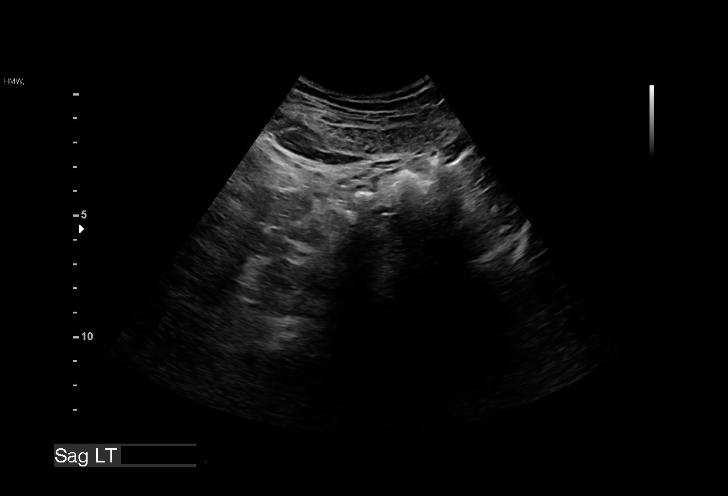
[im 25/32]
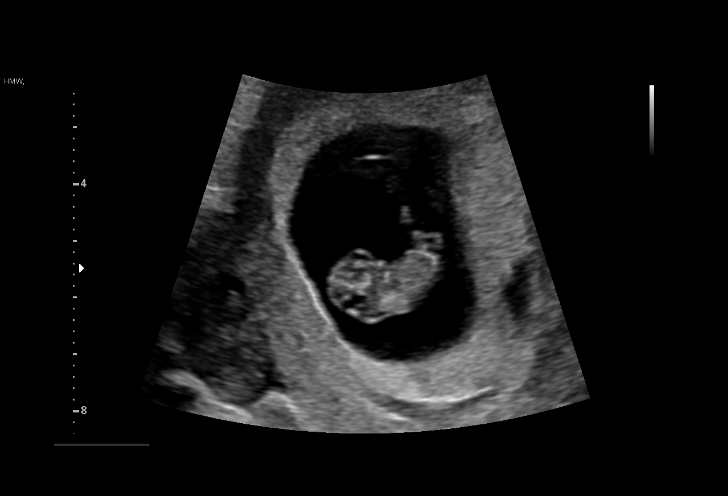
[im 27/32]
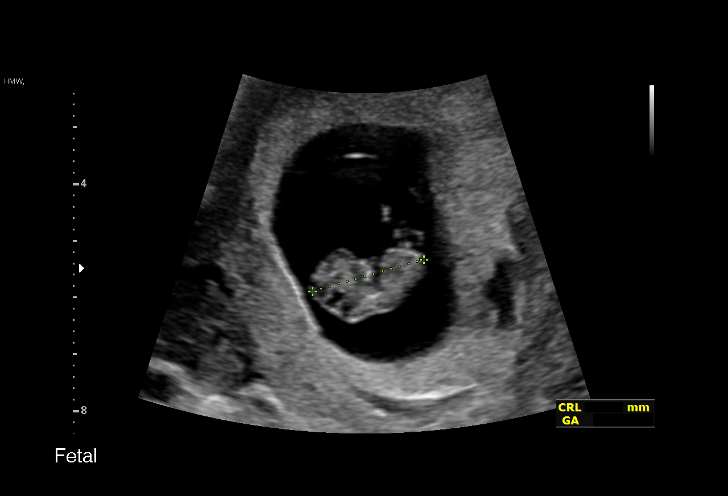
[im 29/32]
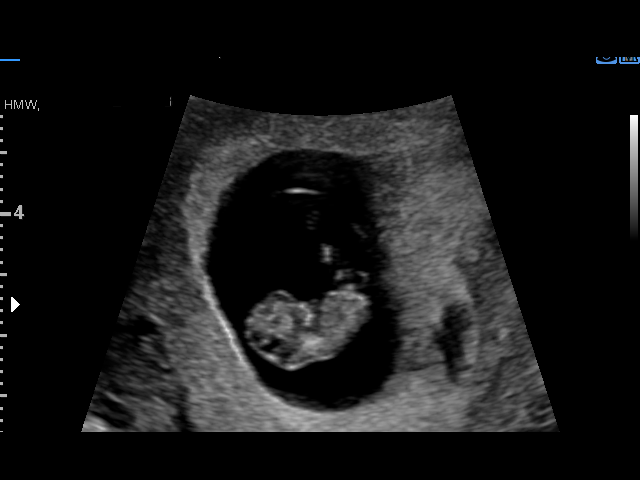
[im 32/32]
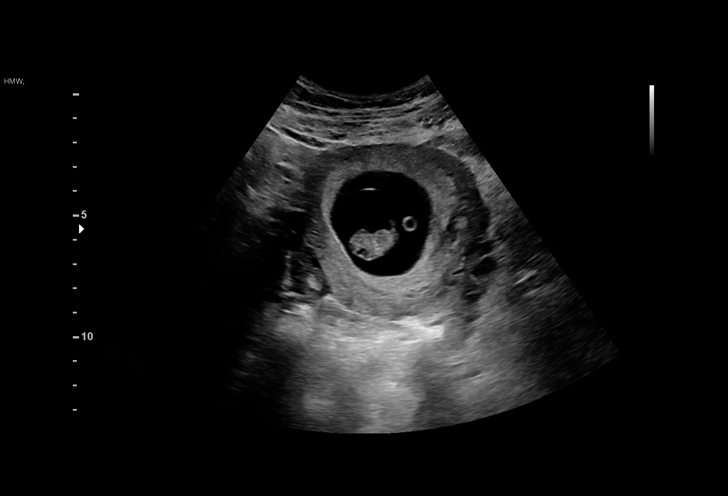

[15 of 28 positions shown; findings below may reference images not displayed]

FINDINGS: Intrauterine gestational sac: Present, single

Yolk sac:  Single, normal-appearing

Embryo:  Now identified, single

Cardiac Activity: Present

Heart Rate: 176 bpm

MSD: Appropriate in relation to the size of the fetal pole

CRL:   21 mm mm   8 w 4 d                  US EDC: 10/12/2020

Subchorionic hemorrhage:  None visualized.

Maternal uterus/adnexae: No uterine masses are seen. The cervix is
closed within estimated length of 4.6 cm. The placenta is not well
visualized given the early phase of gestation. There is no free
fluid within the cul-de-sac. The maternal right ovary is normal in
size and echogenicity. The left ovary is not visualized.
IMPRESSION: Single living intrauterine gestation with an estimated gestational
age of 8 weeks, 4 days. Estimated date of delivery is 10/12/2020.
Appropriate interval growth since prior examination.

## 2022-11-10 ENCOUNTER — Emergency Department (HOSPITAL_COMMUNITY): Payer: Medicaid Other

## 2022-11-10 ENCOUNTER — Other Ambulatory Visit: Payer: Self-pay

## 2022-11-10 ENCOUNTER — Emergency Department (HOSPITAL_COMMUNITY)
Admission: EM | Admit: 2022-11-10 | Discharge: 2022-11-11 | Disposition: A | Discharge status: Home / Self Care | Payer: Medicaid Other | Attending: Emergency Medicine | Admitting: Emergency Medicine

## 2022-11-10 DIAGNOSIS — D649 Anemia, unspecified: Secondary | ICD-10-CM | POA: Diagnosis not present

## 2022-11-10 DIAGNOSIS — K219 Gastro-esophageal reflux disease without esophagitis: Secondary | ICD-10-CM | POA: Insufficient documentation

## 2022-11-10 DIAGNOSIS — E876 Hypokalemia: Secondary | ICD-10-CM | POA: Diagnosis not present

## 2022-11-10 DIAGNOSIS — R079 Chest pain, unspecified: Secondary | ICD-10-CM | POA: Diagnosis present

## 2022-11-10 LAB — CBC
HCT: 37.3 % (ref 36.0–46.0)
Hemoglobin: 11.5 g/dL — ABNORMAL LOW (ref 12.0–15.0)
MCH: 23.7 pg — ABNORMAL LOW (ref 26.0–34.0)
MCHC: 30.8 g/dL (ref 30.0–36.0)
MCV: 76.9 fL — ABNORMAL LOW (ref 80.0–100.0)
Platelets: 470 10*3/uL — ABNORMAL HIGH (ref 150–400)
RBC: 4.85 MIL/uL (ref 3.87–5.11)
RDW: 17.9 % — ABNORMAL HIGH (ref 11.5–15.5)
WBC: 10 10*3/uL (ref 4.0–10.5)
nRBC: 0 % (ref 0.0–0.2)

## 2022-11-10 LAB — BASIC METABOLIC PANEL
Anion gap: 12 (ref 5–15)
BUN: <5 mg/dL — ABNORMAL LOW (ref 6–20)
CO2: 26 mmol/L (ref 22–32)
Calcium: 9.2 mg/dL (ref 8.9–10.3)
Chloride: 98 mmol/L (ref 98–111)
Creatinine, Ser: 0.88 mg/dL (ref 0.44–1.00)
GFR, Estimated: >60 mL/min (ref >60)
Glucose, Bld: 100 mg/dL — ABNORMAL HIGH (ref 70–99)
Potassium: 2.9 mmol/L — ABNORMAL LOW (ref 3.5–5.1)
Sodium: 136 mmol/L (ref 135–145)

## 2022-11-10 LAB — TROPONIN I (HIGH SENSITIVITY)
Troponin I (High Sensitivity): 8 ng/L (ref <18)
Troponin I (High Sensitivity): 7 ng/L (ref <18)

## 2022-11-10 MED ORDER — OMEPRAZOLE 20 MG PO CPDR: 20.0000 mg | DELAYED_RELEASE_CAPSULE | Freq: Two times a day (BID) | ORAL | 0 refills | Status: DC

## 2022-11-10 MED ORDER — ALUM & MAG HYDROXIDE-SIMETH 200-200-20 MG/5ML PO SUSP
30.0000 mL | Freq: Once | ORAL | Status: AC
  Administered 2022-11-10: 30 mL via ORAL
  Filled 2022-11-10: qty 30

## 2022-11-10 MED ORDER — POTASSIUM CHLORIDE ER 20 MEQ PO TBCR: 20.0000 meq | EXTENDED_RELEASE_TABLET | Freq: Every day | ORAL | 0 refills | Status: DC

## 2022-11-10 MED ORDER — POTASSIUM CHLORIDE CRYS ER 20 MEQ PO TBCR
40.0000 meq | EXTENDED_RELEASE_TABLET | Freq: Once | ORAL | Status: AC
  Administered 2022-11-10: 40 meq via ORAL
  Filled 2022-11-10: qty 2

## 2022-11-10 NOTE — ED Provider Notes (Signed)
[facility] Provider Note   CSN: [id number] Arrival date & time: 11/10/22  1906     History  Chief Complaint  Patient presents with   Chest Pain    Cindy Cruz is a 37 y.o. female.  HPI 37 year old female history depression, trichomonas, GERD presents to the ER with complaints of chest pain that is traveling up her esophagus into her throat.  She has had a history of reflux but her symptoms got significantly worse last night which prompted her to come be evaluated today.  She states that she took some baking soda with minimal relief.  Denies any recent travel, not on any oral contraception or estrogen products.  Denies any leg swelling.  No prior cardiac history.  Denies any dizziness, syncope.  She reports onset of shortness of breath due to the pain but none with ambulation.  She has not taken any other medicines.  Denies any cough, fevers or chills.    Home Medications Prior to Admission medications   Medication Sig Start Date End Date Taking? Authorizing Provider  omeprazole (PRILOSEC) 20 MG capsule Take 1 capsule (20 mg total) by mouth 2 (two) times daily before a meal. 11/10/22  Yes Sharyn Lull A, PA-C  potassium chloride 20 MEQ TBCR Take 1 tablet (20 mEq total) by mouth daily. 11/10/22  Yes Garald Balding, PA-C  amLODipine (NORVASC) 5 MG tablet Take 1 tablet (5 mg total) by mouth daily. 10/05/20   Cresenzo-Dishmon, Joaquim Lai, CNM  amLODipine (NORVASC) 5 MG tablet TAKE 1 TABLET (5 MG TOTAL) BY MOUTH DAILY. 10/05/20 10/05/21  Cresenzo-Dishmon, Joaquim Lai, CNM  amLODipine (NORVASC) 5 MG tablet TAKE 1 TABLET (5 MG TOTAL) BY MOUTH DAILY. 10/05/20 10/05/21  Alcus Dad, MD  famotidine (PEPCID) 40 MG tablet Take 1 tablet (40 mg total) by mouth 2 (two) times daily. 09/07/20   Gavin Pound, CNM  ibuprofen (ADVIL) 600 MG tablet Take 1 tablet (600 mg total) by mouth every 6 (six) hours. 10/05/20   Alcus Dad, MD  ondansetron (ZOFRAN ODT) 4  MG disintegrating tablet Take 1 tablet (4 mg total) by mouth every 6 (six) hours as needed for nausea. 09/05/20   Constant, Peggy, MD  Prenatal Vit-Fe Fumarate-FA (MULTIVITAMIN-PRENATAL) 27-0.8 MG TABS tablet Take 1 tablet by mouth daily at 12 noon. 09/07/20   Gavin Pound, CNM      Allergies    Patient has no known allergies.    Review of Systems   Review of Systems Ten systems reviewed and are negative for acute change, except as noted in the HPI.   Physical Exam Updated Vital Signs BP (!) 142/90   Pulse 83   Temp 98.2 F (36.8 C) (Oral)   Resp 18   LMP  (LMP Unknown)   SpO2 99%  Physical Exam Vitals and nursing note reviewed.  Constitutional:      General: She is not in acute distress.    Appearance: She is well-developed.  HENT:     Head: Normocephalic and atraumatic.  Eyes:     Conjunctiva/sclera: Conjunctivae normal.  Cardiovascular:     Rate and Rhythm: Normal rate and regular rhythm.     Heart sounds: No murmur heard. Pulmonary:     Effort: Pulmonary effort is normal. No respiratory distress.     Breath sounds: Normal breath sounds.  Abdominal:     Palpations: Abdomen is soft.     Tenderness: There is no abdominal tenderness.  Musculoskeletal:  General: No swelling.     Cervical back: Neck supple.     Right lower leg: No edema.     Left lower leg: No edema.  Skin:    General: Skin is warm and dry.     Capillary Refill: Capillary refill takes less than 2 seconds.  Neurological:     General: No focal deficit present.     Mental Status: She is alert.  Psychiatric:        Mood and Affect: Mood normal.     ED Results / Procedures / Treatments   Labs (all labs ordered are listed, but only abnormal results are displayed) Labs Reviewed  BASIC METABOLIC PANEL - Abnormal; Notable for the following components:      Result Value   Potassium 2.9 (*)    Glucose, Bld 100 (*)    BUN <5 (*)    All other components within normal limits  CBC - Abnormal;  Notable for the following components:   Hemoglobin 11.5 (*)    MCV 76.9 (*)    MCH 23.7 (*)    RDW 17.9 (*)    Platelets 470 (*)    All other components within normal limits  I-STAT BETA HCG BLOOD, ED ([facility], [facility], [facility] ONLY)  TROPONIN I (HIGH SENSITIVITY)  TROPONIN I (HIGH SENSITIVITY)    EKG None  Radiology DG Chest 2 View  Result Date: 11/10/2022 CLINICAL DATA:  Chest pain. EXAM: CHEST - 2 VIEW COMPARISON:  None Available. FINDINGS: The heart size and mediastinal contours are within normal limits. Both lungs are clear. The visualized skeletal structures are unremarkable. IMPRESSION: No active cardiopulmonary disease. Electronically Signed   By: Anner Crete M.D.   On: 11/10/2022 20:18    Procedures Procedures    Medications Ordered in ED Medications  alum & mag hydroxide-simeth (MAALOX/MYLANTA) 200-200-20 MG/5ML suspension 30 mL (30 mLs Oral Given 11/10/22 2309)  potassium chloride SA (KLOR-CON M) CR tablet 40 mEq (40 mEq Oral Given 11/10/22 2309)    ED Course/ Medical Decision Making/ A&P                             Medical Decision Making Risk OTC drugs.  37 year old female presents to the ER with complaints of chest pain.  Vitals on arrival overall reassuring.  DDx includes ACS, PE, GERD, anxiety, pneumothorax, pneumonia, dissection  Labs ordered, reviewed, CBC with mild anemia of 11.5, appears to be at baseline.  BMP with hypokalemia 2.9, delta troponin negative.  Chest x-ray reviewed, agree with radiology read, negative for acute findings.  EKG reviewed, nonischemic.  Given reassuring workup I have low suspicion for ACS, PE (PERC negative), PNA, dissection,  Potassium repleted orally.  Given a GI cocktail with some improvement.  Symptoms likely most consistent with GERD.  She was instructed to take additional supplementation and eat high potassium foods, handout provided.  Will also prescribe omeprazole and refer to GI for further evaluation.  We discussed return  precautions. She voiced understanding and was agreeable   Final Clinical Impression(s) / ED Diagnoses Final diagnoses:  Gastroesophageal reflux disease, unspecified whether esophagitis present  Hypokalemia    Rx / DC Orders ED Discharge Orders          Ordered    omeprazole (PRILOSEC) 20 MG capsule  2 times daily before meals        11/10/22 2322    potassium chloride 20 MEQ TBCR  Daily  11/10/22 2322              Garald Balding, PA-C 11/10/22 2328    Quintella Reichert, MD 11/11/22 [phone]

## 2022-11-10 NOTE — ED Triage Notes (Signed)
Pt presents with chest pain radiating from stomach up along the line of her esophagus up to her throat since yesterday. Described as burning. Has taken some baking soda with some relief.  Non radiating.  No n/v/d.  Some shob d/t pain.

## 2022-11-10 NOTE — Discharge Instructions (Addendum)
You were evaluated in the Emergency Department and after careful evaluation, we did not find any emergent condition requiring admission or further testing in the hospital.  Your workup today is overall reassuring.  Your cardiac workup was negative.  Your potassium was low today, you were given repletion, and I recommend you take additional supplementation.  I have prescribed you 30 days worth of potassium we can also find supplements over-the-counter.  You can also read the handout and eat potassium rich foods.  I suspect her symptoms are due to gastric reflux.  Please call the phone number on your discharge paperwork for [facility] gastroenterology and schedule an appointment for further follow-up.   Please return to the Emergency Department if you experience any worsening of your condition.  We encourage you to follow up with a primary care provider.  Thank you for allowing [place] to be a part of your care.

## 2023-02-16 ENCOUNTER — Other Ambulatory Visit: Payer: Self-pay

## 2023-02-16 ENCOUNTER — Encounter (HOSPITAL_COMMUNITY): Payer: Self-pay

## 2023-02-16 ENCOUNTER — Emergency Department (HOSPITAL_COMMUNITY)
Admission: EM | Admit: 2023-02-16 | Discharge: 2023-02-17 | Disposition: A | Discharge status: Home / Self Care | Payer: Medicaid Other | Attending: Emergency Medicine | Admitting: Emergency Medicine

## 2023-02-16 DIAGNOSIS — A5901 Trichomonal vulvovaginitis: Secondary | ICD-10-CM | POA: Diagnosis not present

## 2023-02-16 DIAGNOSIS — N898 Other specified noninflammatory disorders of vagina: Secondary | ICD-10-CM | POA: Diagnosis present

## 2023-02-16 DIAGNOSIS — N3000 Acute cystitis without hematuria: Secondary | ICD-10-CM | POA: Diagnosis not present

## 2023-02-16 NOTE — ED Provider Notes (Signed)
[place] EMERGENCY DEPARTMENT AT [facility] Provider Note   CSN: [id number] Arrival date & time: 02/16/23  2128     History {Add pertinent medical, surgical, social history, OB history to HPI:1} Chief Complaint  Patient presents with   Vaginal Itching    Cindy Cruz is a 37 y.o. female.  2 days of vaginal discomfort and itching.  States her boyfriend "saw white discharge".  No pain with urination or blood in the urine.  No fever, chills, nausea, vomiting, abdominal pain or back pain.  No pain with urination or blood in the urine.  No cough or fever.  Denies possibility of pregnancy.  States last time she felt this was she was pregnant however.  Tried Vagisil but that caused more burning.  Still has itching and burning.  Denies any pain with urination or blood in the urine.  The history is provided by the patient.  Vaginal Itching Pertinent negatives include no chest pain, no abdominal pain, no headaches and no shortness of breath.       Home Medications Prior to Admission medications   Medication Sig Start Date End Date Taking? Authorizing Provider  amLODipine (NORVASC) 5 MG tablet Take 1 tablet (5 mg total) by mouth daily. 10/05/20   Cresenzo-Dishmon, Scarlette Calico, CNM  amLODipine (NORVASC) 5 MG tablet TAKE 1 TABLET (5 MG TOTAL) BY MOUTH DAILY. 10/05/20 10/05/21  Cresenzo-Dishmon, Scarlette Calico, CNM  amLODipine (NORVASC) 5 MG tablet TAKE 1 TABLET (5 MG TOTAL) BY MOUTH DAILY. 10/05/20 10/05/21  Maury Dus, MD  famotidine (PEPCID) 40 MG tablet Take 1 tablet (40 mg total) by mouth 2 (two) times daily. 09/07/20   Gerrit Heck, CNM  ibuprofen (ADVIL) 600 MG tablet Take 1 tablet (600 mg total) by mouth every 6 (six) hours. 10/05/20   Maury Dus, MD  omeprazole (PRILOSEC) 20 MG capsule Take 1 capsule (20 mg total) by mouth 2 (two) times daily before a meal. 11/10/22   Trudee Grip A, PA-C  ondansetron (ZOFRAN ODT) 4 MG disintegrating tablet Take 1 tablet (4 mg total) by mouth  every 6 (six) hours as needed for nausea. 09/05/20   Constant, Peggy, MD  potassium chloride 20 MEQ TBCR Take 1 tablet (20 mEq total) by mouth daily. 11/10/22   Mare Ferrari, PA-C  Prenatal Vit-Fe Fumarate-FA (MULTIVITAMIN-PRENATAL) 27-0.8 MG TABS tablet Take 1 tablet by mouth daily at 12 noon. 09/07/20   Gerrit Heck, CNM      Allergies    Patient has no known allergies.    Review of Systems   Review of Systems  Constitutional:  Negative for activity change, appetite change and fever.  HENT:  Negative for congestion.   Respiratory:  Negative for cough, chest tightness and shortness of breath.   Cardiovascular:  Negative for chest pain.  Gastrointestinal:  Negative for abdominal pain, nausea and vomiting.  Genitourinary:  Positive for vaginal discharge and vaginal pain. Negative for dysuria and vaginal bleeding.  Musculoskeletal:  Negative for arthralgias and myalgias.  Skin:  Negative for rash.  Neurological:  Negative for dizziness, facial asymmetry, weakness and headaches.   all other systems are negative except as noted in the HPI and PMH.    Physical Exam Updated Vital Signs BP (!) 144/107 (BP Location: Right Arm)   Pulse 84   Temp 98.6 F (37 C) (Oral)   Resp 18   Ht 5\' 5"  (1.651 m)   Wt 93 kg   LMP 01/18/2023 (Exact Date)   SpO2 100%  BMI 34.12 kg/m  Physical Exam Vitals and nursing note reviewed.  Constitutional:      General: She is not in acute distress.    Appearance: She is well-developed.  HENT:     Head: Normocephalic and atraumatic.     Mouth/Throat:     Pharynx: No oropharyngeal exudate.  Eyes:     Conjunctiva/sclera: Conjunctivae normal.     Pupils: Pupils are equal, round, and reactive to light.  Neck:     Comments: No meningismus. Cardiovascular:     Rate and Rhythm: Normal rate and regular rhythm.     Heart sounds: Normal heart sounds. No murmur heard. Pulmonary:     Effort: Pulmonary effort is normal. No respiratory distress.     Breath  sounds: Normal breath sounds.  Abdominal:     Palpations: Abdomen is soft.     Tenderness: There is no abdominal tenderness. There is no guarding or rebound.  Genitourinary:    Comments: Chaperone present Lizzie NT.  Normal external genitalia.  There is white discharge in the vaginal vault that is copious.  No CMT.  No lateralized adnexal pain or midline uterine pain Musculoskeletal:        General: No tenderness. Normal range of motion.     Cervical back: Normal range of motion and neck supple.  Skin:    General: Skin is warm.  Neurological:     Mental Status: She is alert and oriented to person, place, and time.     Cranial Nerves: No cranial nerve deficit.     Motor: No abnormal muscle tone.     Coordination: Coordination normal.     Comments: No ataxia on finger to nose bilaterally. No pronator drift. 5/5 strength throughout. CN 2-12 intact.Equal grip strength. Sensation intact.   Psychiatric:        Behavior: Behavior normal.     ED Results / Procedures / Treatments   Labs (all labs ordered are listed, but only abnormal results are displayed) Labs Reviewed  WET PREP, GENITAL  URINALYSIS, ROUTINE W REFLEX MICROSCOPIC  PREGNANCY, URINE  GC/CHLAMYDIA PROBE AMP ([place]) NOT AT [facility]    EKG None  Radiology No results found.  Procedures Procedures  {Document cardiac monitor, telemetry assessment procedure when appropriate:1}  Medications Ordered in ED Medications - No data to display  ED Course/ Medical Decision Making/ A&P   {   Click here for ABCD2, HEART and other calculatorsREFRESH Note before signing :1}                          Medical Decision Making Amount and/or Complexity of Data Reviewed Labs: ordered. Radiology: ordered and independent interpretation performed. Decision-making details documented in ED Course. ECG/medicine tests: independent interpretation performed. Decision-making details documented in ED Course.   2 days of vaginal itching and  burning. Abdomen soft, no peritoneal signs.   {Document critical care time when appropriate:1} {Document review of labs and clinical decision tools ie heart score, Chads2Vasc2 etc:1}  {Document your independent review of radiology images, and any outside records:1} {Document your discussion with family members, caretakers, and with consultants:1} {Document social determinants of health affecting pt's care:1} {Document your decision making why or why not admission, treatments were needed:1} Final Clinical Impression(s) / ED Diagnoses Final diagnoses:  None    Rx / DC Orders ED Discharge Orders     None

## 2023-02-16 NOTE — ED Triage Notes (Signed)
Pt arrived from home via POV c/o vaginal itching that began about a week ago. Pt states that white discharge was noted by her boyfriend.

## 2023-02-17 LAB — URINALYSIS, ROUTINE W REFLEX MICROSCOPIC
Bilirubin Urine: NEGATIVE
Glucose, UA: NEGATIVE mg/dL
Hgb urine dipstick: NEGATIVE
Ketones, ur: NEGATIVE mg/dL
Nitrite: NEGATIVE
Protein, ur: 30 mg/dL — AB
Specific Gravity, Urine: 1.027 (ref 1.005–1.030)
pH: 6 (ref 5.0–8.0)

## 2023-02-17 LAB — WET PREP, GENITAL
Sperm: NONE SEEN
WBC, Wet Prep HPF POC: <10 (ref <10)
Yeast Wet Prep HPF POC: NONE SEEN

## 2023-02-17 LAB — GC/CHLAMYDIA PROBE AMP (~~LOC~~) NOT AT ARMC
Chlamydia: NEGATIVE
Comment: NEGATIVE
Comment: NORMAL
Neisseria Gonorrhea: NEGATIVE

## 2023-02-17 LAB — PREGNANCY, URINE: Preg Test, Ur: NEGATIVE

## 2023-02-17 MED ORDER — CEPHALEXIN 500 MG PO CAPS: 500.0000 mg | ORAL_CAPSULE | Freq: Three times a day (TID) | ORAL | 0 refills | Status: DC

## 2023-02-17 MED ORDER — METRONIDAZOLE 500 MG PO TABS: 500.0000 mg | ORAL_TABLET | Freq: Two times a day (BID) | ORAL | 0 refills | Status: DC

## 2023-02-17 MED ORDER — DOXYCYCLINE HYCLATE 100 MG PO CAPS: 100.0000 mg | ORAL_CAPSULE | Freq: Two times a day (BID) | ORAL | 0 refills | Status: DC

## 2023-02-17 MED ORDER — METRONIDAZOLE 500 MG PO TABS
500.0000 mg | ORAL_TABLET | Freq: Once | ORAL | Status: AC
  Administered 2023-02-17: 500 mg via ORAL
  Filled 2023-02-17: qty 1

## 2023-02-17 MED ORDER — DOXYCYCLINE HYCLATE 100 MG PO TABS
100.0000 mg | ORAL_TABLET | Freq: Once | ORAL | Status: AC
  Administered 2023-02-17: 100 mg via ORAL
  Filled 2023-02-17: qty 1

## 2023-02-17 MED ORDER — CEFTRIAXONE SODIUM 500 MG IJ SOLR
500.0000 mg | Freq: Once | INTRAMUSCULAR | Status: AC
  Administered 2023-02-17: 500 mg via INTRAMUSCULAR
  Filled 2023-02-17: qty 500

## 2023-02-17 MED ORDER — STERILE WATER FOR INJECTION IJ SOLN
INTRAMUSCULAR | Status: AC
  Filled 2023-02-17: qty 10

## 2023-02-17 MED ORDER — CEPHALEXIN 250 MG PO CAPS
500.0000 mg | ORAL_CAPSULE | Freq: Once | ORAL | Status: AC
  Administered 2023-02-17: 500 mg via ORAL
  Filled 2023-02-17: qty 2

## 2023-02-17 NOTE — Discharge Instructions (Addendum)
Today you are treated for trichomonas, gonorrhea and chlamydia.  He also treated for urinary tract infection.  Your sexual partner should be treated as well.  Your blood pressure today is elevated.  Keep a record of this and follow-up with your primary care doctor for medication adjustments as needed.  Take the antibiotics as prescribed and follow-up with the women's clinic for further evaluation.  Return to the ED with new or worsening symptoms.

## 2023-03-07 ENCOUNTER — Encounter (HOSPITAL_COMMUNITY): Payer: Self-pay

## 2023-03-07 ENCOUNTER — Ambulatory Visit (HOSPITAL_COMMUNITY)
Admission: EM | Admit: 2023-03-07 | Discharge: 2023-03-07 | Disposition: A | Discharge status: Home / Self Care | Payer: MEDICAID | Attending: Emergency Medicine | Admitting: Emergency Medicine

## 2023-03-07 DIAGNOSIS — N760 Acute vaginitis: Secondary | ICD-10-CM | POA: Diagnosis present

## 2023-03-07 DIAGNOSIS — R300 Dysuria: Secondary | ICD-10-CM | POA: Diagnosis not present

## 2023-03-07 DIAGNOSIS — B9689 Other specified bacterial agents as the cause of diseases classified elsewhere: Secondary | ICD-10-CM

## 2023-03-07 DIAGNOSIS — A599 Trichomoniasis, unspecified: Secondary | ICD-10-CM | POA: Diagnosis not present

## 2023-03-07 LAB — POCT URINALYSIS DIP (MANUAL ENTRY)
Bilirubin, UA: NEGATIVE
Blood, UA: NEGATIVE
Glucose, UA: NEGATIVE mg/dL
Nitrite, UA: NEGATIVE
Protein Ur, POC: 30 mg/dL — AB
Spec Grav, UA: 1.02 (ref 1.010–1.025)
Urobilinogen, UA: 1 E.U./dL
pH, UA: 8.5 — AB (ref 5.0–8.0)

## 2023-03-07 LAB — HIV ANTIBODY (ROUTINE TESTING W REFLEX): HIV Screen 4th Generation wRfx: NONREACTIVE

## 2023-03-07 MED ORDER — METRONIDAZOLE 500 MG PO TABS: 500.0000 mg | ORAL_TABLET | Freq: Two times a day (BID) | ORAL | 0 refills | Status: AC

## 2023-03-07 NOTE — ED Provider Notes (Signed)
[facility]    CSN: [id number] Arrival date & time: 03/07/23  1831      History   Chief Complaint Chief Complaint  Patient presents with   SEXUALLY TRANSMITTED DISEASE    HPI Cindy Cruz is a 37 y.o. female.   Patient presents to clinic over concerns of vaginal itching and vaginal discharge.  She was seen at a local emergency department on 02/16/23 where she was diagnosed with a urinary tract infection, bacterial vaginosis, and trichomoniasis.  She reports that she had shared some of her medications with her partner, and has no more Flagyl left.  Thinks she completed about 4 days of the Flagyl.  She still has doxycycline and Keflex left.  She still does have some discomfort with urination.  Admits to not taking antibiotics as prescribed.  Denies fevers or flank pain.   The history is provided by the patient and medical records.    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 Authorizing Provider  cephALEXin (KEFLEX) 500 MG capsule Take 1 capsule (500 mg total) by mouth 3 (three) times daily. 02/17/23   Rancour, Jeannett Senior, MD  doxycycline (VIBRAMYCIN) 100 MG capsule Take 1 capsule (100 mg total) by mouth 2 (two) times daily. 02/17/23   Rancour, Jeannett Senior, MD  metroNIDAZOLE (FLAGYL) 500 MG tablet Take 1 tablet (500 mg total) by mouth 2 (two) times daily for 7 days. 03/07/23 03/14/23  Priyal Musquiz, [place] N, FNP  potassium chloride 20 MEQ TBCR Take 1 tablet (20 mEq total) by mouth daily. 11/10/22   Mare Ferrari, PA-C    Family History Family History  Problem Relation Age of Onset   Hypertension Mother    Cancer Maternal Aunt        breast   Hearing loss Neg Hx     Social History Social History   Tobacco Use   Smoking status: Light Smoker    Current packs/day: 0.25    Average packs/day: 0.3 packs/day for 10.0 years (2.5 ttl pk-yrs)    Types: Cigarettes   Smokeless tobacco: Never  Vaping Use   Vaping status: Never Used  Substance Use Topics   Alcohol use: Not  Currently    Comment: socially on occ.-liquor   Drug use: No     Allergies   Patient has no known allergies.   Review of Systems Review of Systems  Constitutional:  Negative for fever.  Gastrointestinal:  Negative for abdominal pain, nausea and vomiting.  Genitourinary:  Positive for dysuria and vaginal discharge. Negative for flank pain.     Physical Exam Triage Vital Signs ED Triage Vitals  Encounter Vitals Group     BP 03/07/23 1846 (!) 144/104     Systolic BP Percentile --      Diastolic BP Percentile --      Pulse Rate 03/07/23 1846 95     Resp 03/07/23 1846 16     Temp 03/07/23 1846 98.8 F (37.1 C)     Temp Source 03/07/23 1846 Oral     SpO2 03/07/23 1846 97 %     Weight --      Height --      Head Circumference --      Peak Flow --      Pain Score 03/07/23 1848 0     Pain Loc --      Pain Education --      Exclude from Growth  Chart --    No data found.  Updated Vital Signs BP (!) 144/104 (BP Location: Left Arm)   Pulse 95   Temp 98.8 F (37.1 C) (Oral)   Resp 16   LMP 02/26/2023 (Approximate)   SpO2 97%   Visual Acuity Right Eye Distance:   Left Eye Distance:   Bilateral Distance:    Right Eye Near:   Left Eye Near:    Bilateral Near:     Physical Exam Vitals and nursing note reviewed.  Constitutional:      Appearance: Normal appearance.  HENT:     Head: Normocephalic and atraumatic.     Right Ear: External ear normal.     Left Ear: External ear normal.     Nose: Nose normal.     Mouth/Throat:     Mouth: Mucous membranes are moist.  Cardiovascular:     Rate and Rhythm: Normal rate.  Pulmonary:     Effort: Pulmonary effort is normal. No respiratory distress.  Musculoskeletal:        General: Normal range of motion.  Neurological:     General: No focal deficit present.     Mental Status: She is alert and oriented to person, place, and time.  Psychiatric:        Mood and Affect: Mood normal.      UC Treatments / Results  Labs (all labs ordered are listed, but only abnormal results are displayed) Labs Reviewed  POCT URINALYSIS DIP (MANUAL ENTRY) - Abnormal; Notable for the following components:      Result Value   Ketones, POC UA trace (5) (*)    pH, UA 8.5 (*)    Protein Ur, POC =30 (*)    Leukocytes, UA Trace (*)    All other components within normal limits  URINE CULTURE  RPR  HIV ANTIBODY (ROUTINE TESTING W REFLEX)  CERVICOVAGINAL ANCILLARY ONLY    EKG   Radiology No results found.  Procedures Procedures (including critical care time)  Medications Ordered in UC Medications - No data to display  Initial Impression / Assessment and Plan / UC Course  I have reviewed the triage vital signs and the nursing notes.  Pertinent labs & imaging results that were available during my  care of the patient were reviewed by me and considered in my medical decision making (see  chart for details).  Vitals and triage reviewed, patient is hemodynamically stable.  Did not complete her Flagyl for bacterial vaginosis or trichomoniasis, will restart this.  Cytology swab obtained as well as labs for HIV and syphilis.  Patient had urinary tract infection at emergency department visit and has had noncompliance with antibiotics.  Urinalysis shows trace ketones, protein, and trace leukocytes.  Will send for culture.  Encouraged antibiotic compliance and abstinence until all antibiotics are completed appropriately.  Plan of care, follow-up care and return precautions given, no questions at this time.    Final Clinical Impressions(s) / UC Diagnoses   Final diagnoses:  Bacterial vaginosis  Trichomoniasis  Dysuria     Discharge Instructions      It is important that you take your Flagyl twice daily for the next 7 days to adequately treat your vaginal infections.  Please do not share any antibiotics, as these are dosed out to treat your infection appropriately ensuring would diminish your supply, and adequately treating your infection.  We have tested you for other sexually transmitted infections today, and we will contact you if we need to modify your treatment plan.  Abstain from intercourse for the next 7 days, until you have completed treatment for trichomoniasis, to help prevent reinfection and to prevent spreading it to your partner.  Please finish last of your doxycycline and Keflex at as prescribed for your urinary tract infection.  Return to clinic for any new or urgent symptoms.      ED Prescriptions     Medication Sig Dispense Auth. Provider   metroNIDAZOLE (FLAGYL) 500 MG tablet Take 1 tablet (500 mg total) by mouth 2 (two) times daily for 7 days. 14 tablet Eluzer How[place], [place] N, [place]      PDMP not reviewed this encounter.   Marea Reasner, [place] N, [place] 03/07/23 1927

## 2023-03-07 NOTE — ED Triage Notes (Signed)
Patient reports that she was diagnosed with trich. Patient states that she shared her discharge medications with her sexual partner and is afraid that she still has trich.  Patient states she still has vaginal itching and white discharge.

## 2023-03-07 NOTE — Discharge Instructions (Addendum)
It is important that you take your Flagyl twice daily for the next 7 days to adequately treat your vaginal infections.  Please do not share any antibiotics, as these are dosed out to treat your infection appropriately ensuring would diminish your supply, and adequately treating your infection.  We have tested you for other sexually transmitted infections today, and we will contact you if we need to modify your treatment plan.  Abstain from intercourse for the next 7 days, until you have completed treatment for trichomoniasis, to help prevent reinfection and to prevent spreading it to your partner.  Please finish last of your doxycycline and Keflex at as prescribed for your urinary tract infection.  Return to clinic for any new or urgent symptoms.

## 2023-03-08 LAB — URINE CULTURE

## 2023-03-08 LAB — RPR: RPR Ser Ql: NONREACTIVE

## 2023-03-10 LAB — CERVICOVAGINAL ANCILLARY ONLY
Bacterial Vaginitis (gardnerella): POSITIVE — AB
Candida Glabrata: NEGATIVE
Candida Vaginitis: POSITIVE — AB
Chlamydia: NEGATIVE
Comment: NEGATIVE
Comment: NEGATIVE
Comment: NEGATIVE
Comment: NEGATIVE
Comment: NEGATIVE
Comment: NORMAL
Neisseria Gonorrhea: POSITIVE — AB
Trichomonas: NEGATIVE

## 2023-03-11 ENCOUNTER — Telehealth (HOSPITAL_COMMUNITY): Payer: Self-pay | Admitting: Emergency Medicine

## 2023-03-11 ENCOUNTER — Ambulatory Visit (HOSPITAL_COMMUNITY)
Admission: EM | Admit: 2023-03-11 | Discharge: 2023-03-11 | Disposition: A | Discharge status: Home / Self Care | Payer: MEDICAID | Attending: Family Medicine | Admitting: Family Medicine

## 2023-03-11 DIAGNOSIS — A549 Gonococcal infection, unspecified: Secondary | ICD-10-CM | POA: Diagnosis not present

## 2023-03-11 MED ORDER — CEFTRIAXONE SODIUM 500 MG IJ SOLR
500.0000 mg | INTRAMUSCULAR | Status: DC
  Administered 2023-03-11: 500 mg via INTRAMUSCULAR

## 2023-03-11 MED ORDER — CEFTRIAXONE SODIUM 500 MG IJ SOLR
INTRAMUSCULAR | Status: AC
  Filled 2023-03-11: qty 500

## 2023-03-11 MED ORDER — FLUCONAZOLE 150 MG PO TABS: 150.0000 mg | ORAL_TABLET | Freq: Once | ORAL | 0 refills | Status: AC

## 2023-03-11 NOTE — Telephone Encounter (Signed)
Patient went home on Metronidazole Per protocol, patient will need treatment with Im Rocephin 500mg  for positive Gonorrhea. Will also need treatment with Diflucan. Contacted patient by phone.  Verified identity using two identifiers.  Provided positive result.  Reviewed safe sex practices, notifying partners, and refraining from sexual activities for 7 days from time of treatment.  Patient verified understanding, all questions answered.   Verified pharmacy, prescription sent [facility] notified

## 2023-03-11 NOTE — ED Triage Notes (Signed)
Pt is here for sti treatment. Pt tested positive for Gonorrhea. Pt has been advised to refrain from any intercourse for 7-14 days until her and partner have been treated. Pt voice understanding.

## 2023-08-20 NOTE — L&D Delivery Note (Addendum)
 OB/[facility]N Faculty Practice Delivery Note  Cindy Cruz is a 38 y.o. [id number] s/p SVD at [redacted]w[redacted]d. She was admitted for IOL for severe pre-eclampsia by blood pressure.   ROM: 10h 36m with clear fluid GBS Status: POSITIVE/-- (08/06 1729),  Maximum Maternal Temperature: 37 C  Labor Progress: Initial SVE: closed. She was started on magnesium  for severe-range blood pressures. She received ripening with misoprostol  and foley balloon. She received both AROM and pitcoin for induction. She then progressed to complete.   Delivery Date/Time: 03/26/2024 Delivery: Called to room and patient was complete and pushing. Head delivered with good maternal effort. No nuchal cord present. Shoulder and body delivered in usual fashion. Infant with poor tone and color. Cord clamped x 2 immediately, and cut by provider. Cord blood drawn. Placenta delivered spontaneously with gentle cord traction. Immediately after delivery of placenta, brisk bleeding was noted. Pitocin  was given. Rectal misoprostol  800 mcg given. Bleeding continued. Attending, Cindy. Zina, called to bedside. Bimanual massage by Clinical research associate, demonstrated boggy lower uterine segment. Cindy. Zina placed Jada. Code hemorrhage was called. Foley catheter was placed to drain the bladder. Patient was alert to voice but with blood pressure readings in the 70s/40s. Jada was removed and Cindy. Zina performed an additional uterine sweep. Labia, perineum, vagina, and cervix inspected and found to be intact. Buccal misoprostol  200 mcg was given. Cindy. Zina replaced the Jada. Massive transfusion protocol was called. The patient received the following blood products: pRBC x 4, FFP x 2, Cryoprecipitate x 1.   There was minimal blood in the suction canister after the Jada was successfully replaced.  Approximately 20 min later the suction canister filled to 700cc. After replacement there was minimal flow of blood through the Jada system, however, there was bright red brisk bleeding around the Jada  with any valsalva or abdominal pressure from the patient. Phenylephrine  was started for continued low blood pressures. Cindy. Zina called IR for likely uterine artery embolization. Labs drawn at delivery are pending. Patient is alert and oriented.    Baby Weight: pending  Placenta: 3 vessel, intact. Sent to L&D Complications: Postpartum Hemorrhage Lacerations: None EBL: 3306 mL Analgesia: Epidural   Infant:  APGAR (1 MIN): 3  APGAR (5 MINS): 7   Cindy DELENA School, MD Family Medicine Resident 03/26/2024, 2:50 AM  Attestation:  I confirm that I have verified the information documented in the resident's note and that I have also personally reperformed the physical exam and all medical decision making activities.   I was gloved and present for entire delivery SVD without incidentFetal heart rate decelerations prior to delivery.  Preparations made for vacuum assisted deliveryby Cindy Cruz CNM.  As we were peparing for this patient pushed and delivered spontaneouslyl.  NICU team present and assumed care of baby.   No difficulty with shoulders No lacerations  Attention turned to third stage and placenta delivered spontaneously with cont cord traction. After placenta delivered. Gush of blood startec.  Pitocin  drip started and TXA given.  Heavy bleeding continued and Cytotex 800 mcg given rectally.Cindy Cruz  Heavy bleeding continues as uterus was bcoming more firm.  Uterine sweep done and Cindy Cruz redwood and arrived.  Code Hemmorrhage called and Cindy Cruz arrived also.  We gave an additional 200mcg cytotec  buccally. Blood was drawn for CBC andDIC panel.  Jada inserted by Cindy Cruz and suction applied.  Hemabate  given. Concurrently Cindy Cruz placed a second JADA.  Blood ordered via MTP.  As protocol proceeded hospitalists assumed care of patfient Cindy Cruz  Cindy Cruz, CNM

## 2023-09-08 ENCOUNTER — Ambulatory Visit: Payer: MEDICAID

## 2023-09-08 VITALS — BP 131/89 | HR 89 | Wt 194.0 lb

## 2023-09-08 DIAGNOSIS — Z3201 Encounter for pregnancy test, result positive: Secondary | ICD-10-CM | POA: Diagnosis not present

## 2023-09-08 LAB — POCT URINE PREGNANCY: Preg Test, Ur: POSITIVE — AB

## 2023-09-08 MED ORDER — FAMOTIDINE 20 MG PO TABS: 20.0000 mg | ORAL_TABLET | Freq: Two times a day (BID) | ORAL | 12 refills | Status: DC

## 2023-09-08 MED ORDER — PROMETHAZINE HCL 25 MG PO TABS: 25.0000 mg | ORAL_TABLET | Freq: Four times a day (QID) | ORAL | 1 refills | Status: DC | PRN

## 2023-09-08 MED ORDER — PRENATAL 28-0.8 MG PO TABS: 1.0000 | ORAL_TABLET | Freq: Every day | ORAL | 12 refills | Status: DC

## 2023-09-08 NOTE — Progress Notes (Signed)
[name] here for a UPT. Pt had a positive upt at home. LMP is 07/18/23.     UPT in office Positive.    Reviewed medications and informed to start a PNV, if not already. Pt to follow up in 1 week for New OB visit.

## 2023-09-17 ENCOUNTER — Encounter (HOSPITAL_COMMUNITY): Payer: Self-pay | Admitting: Obstetrics

## 2023-09-17 ENCOUNTER — Inpatient Hospital Stay (HOSPITAL_COMMUNITY)
Admission: AD | Admit: 2023-09-17 | Discharge: 2023-09-17 | Disposition: A | Discharge status: Home / Self Care | Payer: MEDICAID | Attending: Obstetrics | Admitting: Obstetrics

## 2023-09-17 ENCOUNTER — Inpatient Hospital Stay (HOSPITAL_COMMUNITY): Payer: MEDICAID

## 2023-09-17 DIAGNOSIS — O26891 Other specified pregnancy related conditions, first trimester: Secondary | ICD-10-CM | POA: Diagnosis present

## 2023-09-17 DIAGNOSIS — Z3A09 9 weeks gestation of pregnancy: Secondary | ICD-10-CM | POA: Insufficient documentation

## 2023-09-17 DIAGNOSIS — O3680X0 Pregnancy with inconclusive fetal viability, not applicable or unspecified: Secondary | ICD-10-CM | POA: Diagnosis not present

## 2023-09-17 DIAGNOSIS — R1032 Left lower quadrant pain: Secondary | ICD-10-CM | POA: Diagnosis not present

## 2023-09-17 DIAGNOSIS — O0941 Supervision of pregnancy with grand multiparity, first trimester: Secondary | ICD-10-CM | POA: Insufficient documentation

## 2023-09-17 DIAGNOSIS — Z3A08 8 weeks gestation of pregnancy: Secondary | ICD-10-CM

## 2023-09-17 LAB — CBC
HCT: 34.6 % — ABNORMAL LOW (ref 36.0–46.0)
Hemoglobin: 10.9 g/dL — ABNORMAL LOW (ref 12.0–15.0)
MCH: 24.2 pg — ABNORMAL LOW (ref 26.0–34.0)
MCHC: 31.5 g/dL (ref 30.0–36.0)
MCV: 76.9 fL — ABNORMAL LOW (ref 80.0–100.0)
Platelets: 346 10*3/uL (ref 150–400)
RBC: 4.5 MIL/uL (ref 3.87–5.11)
RDW: 22.3 % — ABNORMAL HIGH (ref 11.5–15.5)
WBC: 9.4 10*3/uL (ref 4.0–10.5)
nRBC: 0 % (ref 0.0–0.2)

## 2023-09-17 LAB — COMPREHENSIVE METABOLIC PANEL
ALT: 23 U/L (ref 0–44)
AST: 22 U/L (ref 15–41)
Albumin: 3.2 g/dL — ABNORMAL LOW (ref 3.5–5.0)
Alkaline Phosphatase: 55 U/L (ref 38–126)
Anion gap: 9 (ref 5–15)
BUN: 9 mg/dL (ref 6–20)
CO2: 23 mmol/L (ref 22–32)
Calcium: 9.8 mg/dL (ref 8.9–10.3)
Chloride: 104 mmol/L (ref 98–111)
Creatinine, Ser: 0.72 mg/dL (ref 0.44–1.00)
GFR, Estimated: >60 mL/min (ref >60)
Glucose, Bld: 89 mg/dL (ref 70–99)
Potassium: 4.3 mmol/L (ref 3.5–5.1)
Sodium: 136 mmol/L (ref 135–145)
Total Bilirubin: 0.4 mg/dL (ref 0.0–1.2)
Total Protein: 7.1 g/dL (ref 6.5–8.1)

## 2023-09-17 LAB — HCG, QUANTITATIVE, PREGNANCY: hCG, Beta Chain, Quant, S: 223410 m[IU]/mL — ABNORMAL HIGH (ref <5)

## 2023-09-17 NOTE — MAU Note (Signed)
Cindy Cruz is a 38 y.o. at 104w5d here in MAU reporting: having pain in LLQ. Was jumping on trampoline this weekend.  Pain started after that.  No bleeding Onset of complaint: Sunday Pain score: 6 Vitals:   09/17/23 1700  BP: 119/77  Pulse: 84  Resp: 18  Temp: 98.2 F (36.8 C)  SpO2: 100%      Lab orders placed from triage:     Was to have had [place] today, missed appt/got there late

## 2023-09-17 NOTE — MAU Provider Note (Signed)
Faculty Practice OB/GYN Attending MAU Note  Chief Complaint: Abdominal Pain    None     SUBJECTIVE Cindy Cruz is a 38 y.o. [id number] at [redacted]w[redacted]d by LMP who presents with LLQ pain and no u/s yet this pregnancy. It started with jumping on a trampoline. Denies bleeding.  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   1:1 titer   Depression    ok now   GERD (gastroesophageal reflux disease)    indigestion with pregnancy only; Rx med helps; pt cannot remember name of med   Trichomoniasis    OB History  Gravida Para Term Preterm AB Living  9 7 7  0 1 7  SAB IAB Ectopic Multiple Live Births  0 0 0 0 7    # Outcome Date GA Lbr Len/2nd Weight Sex Type Anes PTL Lv  9 Current           8 Term 10/03/20 [redacted]w[redacted]d 09:13 / 00:01 3005 g M Vag-Spont None  LIV     Birth Comments: WNL  7 Term 08/24/18 [redacted]w[redacted]d 03:30 / 00:07 2830 g M Vag-Spont EPI  LIV     Birth Comments: WNL  6 Term 08/20/16 [redacted]w[redacted]d 02:57 / 00:10 3575 g M Vag-Spont EPI  LIV  5 AB 05/25/14 [redacted]w[redacted]d         4 Term 01/04/09   2722 g M Vag-Spont None N LIV  3 Term 08/21/07   3175 g M Vag-Spont EPI N LIV  2 Term 03/25/05   2812 g F Vag-Spont EPI N LIV  1 Term 12/01/03   3266 g F Vag-Spont EPI N LIV   Past Surgical History:  Procedure Laterality Date   WISDOM TOOTH EXTRACTION     Social History   Socioeconomic History   Marital status: Single    Spouse name: Not on file   Number of children: 6   Years of education: 12   Highest education level: 12th grade  Occupational History   Not on file  Tobacco Use   Smoking status: Some Days    Current packs/day: 0.25    Average packs/day: 0.3 packs/day for 10.0 years (2.5 ttl pk-yrs)    Types: Cigarettes   Smokeless tobacco: Never  Vaping Use   Vaping status: Some Days  Substance and Sexual Activity   Alcohol use: Not Currently    Comment: socially on occ.-liquor   Drug use: No   Sexual activity: Yes    Birth control/protection: None   Other Topics Concern   Not on file  Social History Narrative   Not on file   Social Drivers of Health   Financial Resource Strain: Not on file  Food Insecurity: Not on file  Transportation Needs: Not on file  Physical Activity: Not on file  Stress: Not on file  Social Connections: Not on file  Intimate Partner Violence: Not on file   No current facility-administered medications on file prior to encounter.   Current Outpatient Medications on File Prior to Encounter  Medication Sig Dispense Refill   famotidine (PEPCID) 20 MG tablet Take 1 tablet (20 mg total) by mouth 2 (two) times daily. 60 tablet 12   Prenatal 28-0.8 MG TABS Take 1 tablet by mouth daily. 30 tablet 12   promethazine (PHENERGAN) 25 MG tablet Take 1 tablet (25 mg total) by mouth every 6 (six) hours as needed for nausea or vomiting. 30 tablet 1   cephALEXin (KEFLEX) 500 MG capsule Take 1  capsule (500 mg total) by mouth 3 (three) times daily. (Patient not taking: Reported on 09/08/2023) 21 capsule 0   doxycycline (VIBRAMYCIN) 100 MG capsule Take 1 capsule (100 mg total) by mouth 2 (two) times daily. (Patient not taking: Reported on 09/08/2023) 20 capsule 0   potassium chloride 20 MEQ TBCR Take 1 tablet (20 mEq total) by mouth daily. (Patient not taking: Reported on 09/08/2023) 30 tablet 0   No Known Allergies  ROS: Pertinent items in HPI  OBJECTIVE BP 119/77 (BP Location: Right Arm)   Pulse 84   Temp 98.2 F (36.8 C) (Oral)   Resp 18   Ht 5\' 5"  (1.651 m)   Wt 84.1 kg   LMP 07/18/2023 (Approximate)   SpO2 100%   BMI 30.85 kg/m  CONSTITUTIONAL: Well-developed, well-nourished female in no acute distress.  HENT:  Normocephalic, atraumatic, External right and left ear normal. Oropharynx is clear and moist EYES: Conjunctivae and EOM are normal.  No scleral icterus.  NECK: Normal range of motion, supple, no masses.  Normal thyroid.  SKIN: Skin is warm and dry. No rash noted. Not diaphoretic. No erythema. No  pallor. NEUROLGIC: Alert and oriented to person, place, and time. CARDIOVASCULAR: Normal heart rate noted RESPIRATORY: Effort and breath sounds normal, no problems with respiration noted. ABDOMEN: Soft, normal bowel sounds, no distention noted.  No tenderness, rebound or guarding.  MUSCULOSKELETAL: Normal range of motion. No tenderness.  No cyanosis, clubbing, or edema.  2+ distal pulses.  LAB RESULTS Results for orders placed or performed during the hospital encounter of 09/17/23 (from the past 48 hours)  CBC     Status: Abnormal   Collection Time: 09/17/23  6:09 PM  Result Value Ref Range   WBC 9.4 4.0 - 10.5 K/uL   RBC 4.50 3.87 - 5.11 MIL/uL   Hemoglobin 10.9 (L) 12.0 - 15.0 g/dL   HCT 46.9 (L) 62.9 - 52.8 %   MCV 76.9 (L) 80.0 - 100.0 fL   MCH 24.2 (L) 26.0 - 34.0 pg   MCHC 31.5 30.0 - 36.0 g/dL   RDW 41.3 (H) 24.4 - 01.0 %   Platelets 346 150 - 400 K/uL   nRBC 0.0 0.0 - 0.2 %    Comment: Performed at [facility], [address] [place], [place] [address]  Comprehensive metabolic panel     Status: Abnormal   Collection Time: 09/17/23  6:09 PM  Result Value Ref Range   Sodium 136 135 - 145 mmol/L   Potassium 4.3 3.5 - 5.1 mmol/L   Chloride 104 98 - 111 mmol/L   CO2 23 22 - 32 mmol/L   Glucose, Bld 89 70 - 99 mg/dL    Comment: Glucose reference range applies only to samples taken after fasting for at least 8 hours.   BUN 9 6 - 20 mg/dL   Creatinine, Ser 6.64 0.44 - 1.00 mg/dL   Calcium 9.8 8.9 - 40.3 mg/dL   Total Protein 7.1 6.5 - 8.1 g/dL   Albumin 3.2 (L) 3.5 - 5.0 g/dL   AST 22 15 - 41 U/L   ALT 23 0 - 44 U/L   Alkaline Phosphatase 55 38 - 126 U/L   Total Bilirubin 0.4 0.0 - 1.2 mg/dL   GFR, Estimated >47 >42 mL/min    Comment: (NOTE) Calculated using the CKD-EPI Creatinine Equation (2021)    Anion gap 9 5 - 15    Comment: Performed at [facility], [address], [place],  [place] [address]  hCG, quantitative, pregnancy     Status: Abnormal    Collection Time: 09/17/23  6:09 PM  Result Value Ref Range   hCG, Beta Chain, Quant, S 223,410 (H) <5 mIU/mL    Comment:          GEST. AGE      CONC.  (mIU/mL)   <=1 WEEK        5 - 50     2 WEEKS       50 - 500     3 WEEKS       100 - 10,000     4 WEEKS     1,000 - 30,000     5 WEEKS     3,500 - 115,000   6-8 WEEKS     12,000 - 270,000    12 WEEKS     15,000 - 220,000        FEMALE AND NON-PREGNANT FEMALE:     LESS THAN 5 mIU/mL Performed at [facility], [address] [place], [place] [address]     IMAGING No results found.  MAU COURSE W/u for PUL U/s shows IUP  ASSESSMENT 1. [redacted] weeks gestation of pregnancy   2. Left lower quadrant abdominal pain   3. Pregnancy of unknown anatomic location     PLAN Discharge home Resume [facility]  Follow-up Information     Ob/Gyn, [facility] Follow up.   Contact information: [address] [place] [address] [phone]                Allergies as of 09/17/2023   No Known Allergies      Medication List     STOP taking these medications    cephALEXin 500 MG capsule Commonly known as: KEFLEX   doxycycline 100 MG capsule Commonly known as: VIBRAMYCIN   Potassium Chloride ER 20 MEQ Tbcr       TAKE these medications    famotidine 20 MG tablet Commonly known as: Pepcid Take 1 tablet (20 mg total) by mouth 2 (two) times daily.   Prenatal 28-0.8 MG Tabs Take 1 tablet by mouth daily.   promethazine 25 MG tablet Commonly known as: PHENERGAN Take 1 tablet (25 mg total) by mouth every 6 (six) hours as needed for nausea or vomiting.        Evaluation does not show pathology that would require ongoing emergent intervention or inpatient treatment. Patient is hemodynamically stable and mentating appropriately. Discussed findings and plan with patient, who agrees with care plan. All questions answered. Return precautions discussed and outpatient follow up recommendations given.  Reva Bores, MD 09/17/2023 7:59 PM

## 2023-10-23 ENCOUNTER — Encounter: Payer: MEDICAID | Admitting: Obstetrics and Gynecology

## 2023-11-03 DIAGNOSIS — O0990 Supervision of high risk pregnancy, unspecified, unspecified trimester: Secondary | ICD-10-CM | POA: Insufficient documentation

## 2023-11-04 ENCOUNTER — Ambulatory Visit (INDEPENDENT_AMBULATORY_CARE_PROVIDER_SITE_OTHER): Payer: MEDICAID | Admitting: Obstetrics and Gynecology

## 2023-11-04 ENCOUNTER — Other Ambulatory Visit: Payer: Self-pay

## 2023-11-04 ENCOUNTER — Other Ambulatory Visit (HOSPITAL_COMMUNITY)
Admission: RE | Admit: 2023-11-04 | Discharge: 2023-11-04 | Disposition: A | Discharge status: Home / Self Care | Payer: MEDICAID | Source: Ambulatory Visit | Attending: Obstetrics and Gynecology | Admitting: Obstetrics and Gynecology

## 2023-11-04 ENCOUNTER — Encounter: Payer: Self-pay | Admitting: Obstetrics and Gynecology

## 2023-11-04 VITALS — BP 122/86 | HR 91 | Wt 184.0 lb

## 2023-11-04 DIAGNOSIS — Z113 Encounter for screening for infections with a predominantly sexual mode of transmission: Secondary | ICD-10-CM | POA: Diagnosis not present

## 2023-11-04 DIAGNOSIS — N760 Acute vaginitis: Secondary | ICD-10-CM | POA: Diagnosis not present

## 2023-11-04 DIAGNOSIS — B3731 Acute candidiasis of vulva and vagina: Secondary | ICD-10-CM | POA: Diagnosis not present

## 2023-11-04 DIAGNOSIS — Z641 Problems related to multiparity: Secondary | ICD-10-CM

## 2023-11-04 DIAGNOSIS — Z1339 Encounter for screening examination for other mental health and behavioral disorders: Secondary | ICD-10-CM | POA: Diagnosis not present

## 2023-11-04 DIAGNOSIS — O0990 Supervision of high risk pregnancy, unspecified, unspecified trimester: Secondary | ICD-10-CM | POA: Insufficient documentation

## 2023-11-04 DIAGNOSIS — O09512 Supervision of elderly primigravida, second trimester: Secondary | ICD-10-CM

## 2023-11-04 DIAGNOSIS — B9689 Other specified bacterial agents as the cause of diseases classified elsewhere: Secondary | ICD-10-CM | POA: Diagnosis not present

## 2023-11-04 DIAGNOSIS — O09899 Supervision of other high risk pregnancies, unspecified trimester: Secondary | ICD-10-CM

## 2023-11-04 DIAGNOSIS — O09892 Supervision of other high risk pregnancies, second trimester: Secondary | ICD-10-CM

## 2023-11-04 DIAGNOSIS — Z3A15 15 weeks gestation of pregnancy: Secondary | ICD-10-CM

## 2023-11-04 DIAGNOSIS — Z2839 Other underimmunization status: Secondary | ICD-10-CM

## 2023-11-04 DIAGNOSIS — Z8759 Personal history of other complications of pregnancy, childbirth and the puerperium: Secondary | ICD-10-CM | POA: Diagnosis not present

## 2023-11-04 MED ORDER — PANTOPRAZOLE SODIUM 40 MG PO TBEC: 40.0000 mg | DELAYED_RELEASE_TABLET | Freq: Every day | ORAL | 4 refills | Status: DC

## 2023-11-04 MED ORDER — PREPLUS 27-1 MG PO TABS: 1.0000 | ORAL_TABLET | Freq: Every day | ORAL | 13 refills | Status: DC

## 2023-11-04 MED ORDER — GOJJI WEIGHT SCALE MISC: 1.0000 | 0 refills | Status: DC

## 2023-11-04 MED ORDER — BLOOD PRESSURE KIT DEVI: 1.0000 | 0 refills | Status: DC

## 2023-11-04 MED ORDER — ASPIRIN 81 MG PO TBEC: 81.0000 mg | DELAYED_RELEASE_TABLET | Freq: Every day | ORAL | 2 refills | Status: DC

## 2023-11-04 NOTE — Progress Notes (Signed)
 Subjective:    Cindy Cruz is a [id number] [redacted]w[redacted]d being seen today for her first obstetrical visit.  Her obstetrical history is significant for advanced maternal age, obesity, and pregnancy induced hypertension. GHTN diagnosed at the time of delivery and patient was started on Norvasc postpartum. Patient does intend to breast feed. Pregnancy history fully reviewed.  Patient reports no complaints.  Vitals:   11/04/23 0844  BP: 122/86  Pulse: 91  Weight: 184 lb (83.5 kg)    HISTORY: OB History  Gravida Para Term Preterm AB Living  9 7 7  0 1 7  SAB IAB Ectopic Multiple Live Births  0 0 0 0 7    # Outcome Date GA Lbr Len/2nd Weight Sex Type Anes PTL Lv  9 Current           8 Term 10/03/20 [redacted]w[redacted]d 09:13 / 00:01 6 lb 10 oz (3.005 kg) M Vag-Spont None  LIV     Birth Comments: WNL  7 Term 08/24/18 [redacted]w[redacted]d 03:30 / 00:07 6 lb 3.8 oz (2.83 kg) M Vag-Spont EPI  LIV     Birth Comments: WNL  6 Term 08/20/16 [redacted]w[redacted]d 02:57 / 00:10 7 lb 14.1 oz (3.575 kg) M Vag-Spont EPI  LIV  5 AB 05/25/14 [redacted]w[redacted]d         4 Term 01/04/09   6 lb (2.722 kg) M Vag-Spont None N LIV  3 Term 08/21/07   7 lb (3.175 kg) M Vag-Spont EPI N LIV  2 Term 03/25/05   6 lb 3.2 oz (2.812 kg) F Vag-Spont EPI N LIV  1 Term 12/01/03   7 lb 3.2 oz (3.266 kg) F Vag-Spont EPI N LIV   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 cause   Cancer Maternal Aunt        breast   Hearing loss Neg Hx      Exam    Uterus:   16-weeks  Pelvic Exam:    Perineum: Normal Perineum   Vulva: normal   Vagina:  normal mucosa, normal discharge   pH:     Cervix: multiparous appearance and closed and long   Adnexa: normal adnexa and no mass, fullness, tenderness   Bony Pelvis: gynecoid  System: Breast:  normal appearance, no masses or tenderness   Skin: normal coloration and turgor, no rashes    Neurologic: oriented, no focal deficits   Extremities: normal strength, tone, and muscle mass   HEENT extra ocular movement intact   Mouth/Teeth mucous membranes moist, pharynx normal without lesions and dental hygiene good   Neck supple and no masses   Cardiovascular: regular rate and rhythm   Respiratory:  appears well, vitals normal, no respiratory distress, acyanotic, normal RR, chest clear, no wheezing, crepitations, rhonchi, normal symmetric air entry   Abdomen: soft, non-tender; bowel sounds normal; no masses,  no organomegaly   Urinary:       Assessment:    Pregnancy: [id number] Patient Active Problem List   Diagnosis Date Noted  History of gestational hypertension 11/04/2023   Supervision of high risk pregnancy, antepartum 11/03/2023   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 Prenatal vitamins. Problem list reviewed and updated. Genetic Screening discussed : ordered.  Ultrasound discussed; fetal survey: ordered. Discussed benefits of ASA in the setting of history of GHTN in last pregnancy- Rx provided Refill on prenatal vitamins and Rx protonix provided  Follow up in 4 weeks. 50% of 30 min visit spent on counseling and coordination of care.     Israa Caban 11/04/2023

## 2023-11-04 NOTE — Progress Notes (Signed)
 NOB, c/o heartburns, the Pepcid is not working.

## 2023-11-05 LAB — CBC/D/PLT+RPR+RH+ABO+RUBIGG...
Antibody Screen: NEGATIVE
Basophils Absolute: 0 10*3/uL (ref 0.0–0.2)
Basos: 1 %
EOS (ABSOLUTE): 0.1 10*3/uL (ref 0.0–0.4)
Eos: 2 %
HCV Ab: NONREACTIVE
HIV Screen 4th Generation wRfx: NONREACTIVE
Hematocrit: 36.1 % (ref 34.0–46.6)
Hemoglobin: 11.7 g/dL (ref 11.1–15.9)
Hepatitis B Surface Ag: NEGATIVE
Immature Grans (Abs): 0 10*3/uL (ref 0.0–0.1)
Immature Granulocytes: 0 %
Lymphocytes Absolute: 2.2 10*3/uL (ref 0.7–3.1)
Lymphs: 33 %
MCH: 26.6 pg (ref 26.6–33.0)
MCHC: 32.4 g/dL (ref 31.5–35.7)
MCV: 82 fL (ref 79–97)
Monocytes Absolute: 0.4 10*3/uL (ref 0.1–0.9)
Monocytes: 7 %
Neutrophils Absolute: 3.8 10*3/uL (ref 1.4–7.0)
Neutrophils: 57 %
Platelets: 309 10*3/uL (ref 150–450)
RBC: 4.4 x10E6/uL (ref 3.77–5.28)
RDW: 19.1 % — ABNORMAL HIGH (ref 11.7–15.4)
RPR Ser Ql: NONREACTIVE
Rh Factor: POSITIVE
Rubella Antibodies, IGG: 0.94 {index} — ABNORMAL LOW (ref >0.99)
WBC: 6.6 10*3/uL (ref 3.4–10.8)

## 2023-11-05 LAB — COMPREHENSIVE METABOLIC PANEL
ALT: 13 IU/L (ref 0–32)
AST: 18 IU/L (ref 0–40)
Albumin: 3.7 g/dL — ABNORMAL LOW (ref 3.9–4.9)
Alkaline Phosphatase: 71 IU/L (ref 44–121)
BUN/Creatinine Ratio: 11 (ref 9–23)
BUN: 7 mg/dL (ref 6–20)
Bilirubin Total: <0.2 mg/dL (ref 0.0–1.2)
CO2: 19 mmol/L — ABNORMAL LOW (ref 20–29)
Calcium: 8.9 mg/dL (ref 8.7–10.2)
Chloride: 100 mmol/L (ref 96–106)
Creatinine, Ser: 0.63 mg/dL (ref 0.57–1.00)
Globulin, Total: 2.7 g/dL (ref 1.5–4.5)
Glucose: 86 mg/dL (ref 70–99)
Potassium: 3.7 mmol/L (ref 3.5–5.2)
Sodium: 134 mmol/L (ref 134–144)
Total Protein: 6.4 g/dL (ref 6.0–8.5)
eGFR: 118 mL/min/{1.73_m2} (ref >59)

## 2023-11-05 LAB — CERVICOVAGINAL ANCILLARY ONLY
Bacterial Vaginitis (gardnerella): POSITIVE — AB
Candida Glabrata: NEGATIVE
Candida Vaginitis: POSITIVE — AB
Chlamydia: NEGATIVE
Comment: NEGATIVE
Comment: NEGATIVE
Comment: NEGATIVE
Comment: NEGATIVE
Comment: NEGATIVE
Comment: NORMAL
Neisseria Gonorrhea: NEGATIVE
Trichomonas: NEGATIVE

## 2023-11-05 LAB — HEMOGLOBIN A1C
Est. average glucose Bld gHb Est-mCnc: 105 mg/dL
Hgb A1c MFr Bld: 5.3 % (ref 4.8–5.6)

## 2023-11-05 LAB — HCV INTERPRETATION

## 2023-11-06 LAB — CYTOLOGY - PAP
Comment: NEGATIVE
Diagnosis: NEGATIVE
High risk HPV: NEGATIVE

## 2023-11-06 LAB — AFP, SERUM, OPEN SPINA BIFIDA
AFP MoM: 1.42
AFP Value: 42 ng/mL
Gest. Age on Collection Date: 15.4 wk
Maternal Age At EDD: 36.8 a
OSBR Risk 1 IN: 6808
Test Results:: NEGATIVE
Weight: 184 [lb_av]

## 2023-11-06 LAB — CULTURE, OB URINE

## 2023-11-06 LAB — PROTEIN / CREATININE RATIO, URINE
Creatinine, Urine: 280.8 mg/dL
Protein, Ur: 39.4 mg/dL
Protein/Creat Ratio: 140 mg/g{creat} (ref 0–200)

## 2023-11-06 LAB — URINE CULTURE, OB REFLEX

## 2023-11-07 DIAGNOSIS — Z2839 Other underimmunization status: Secondary | ICD-10-CM | POA: Insufficient documentation

## 2023-11-07 DIAGNOSIS — O09899 Supervision of other high risk pregnancies, unspecified trimester: Secondary | ICD-10-CM | POA: Insufficient documentation

## 2023-11-07 MED ORDER — METRONIDAZOLE 500 MG PO TABS
500.0000 mg | ORAL_TABLET | Freq: Two times a day (BID) | ORAL | 0 refills | Status: DC
Start: 2023-11-07 — End: 2024-01-05

## 2023-11-07 MED ORDER — FLUCONAZOLE 150 MG PO TABS: 150.0000 mg | ORAL_TABLET | Freq: Once | ORAL | 0 refills | Status: AC

## 2023-11-07 NOTE — Addendum Note (Signed)
 Addended by: Catalina Antigua on: 11/07/2023 02:09 PM   Modules accepted: Orders

## 2023-11-09 LAB — PANORAMA PRENATAL TEST FULL PANEL:PANORAMA TEST PLUS 5 ADDITIONAL MICRODELETIONS: FETAL FRACTION: 6.1

## 2023-11-10 ENCOUNTER — Other Ambulatory Visit: Payer: Self-pay

## 2023-12-02 ENCOUNTER — Encounter: Payer: MEDICAID | Admitting: Advanced Practice Midwife

## 2023-12-03 ENCOUNTER — Ambulatory Visit: Payer: MEDICAID | Admitting: Obstetrics and Gynecology

## 2023-12-03 ENCOUNTER — Encounter: Payer: Self-pay | Admitting: Obstetrics and Gynecology

## 2023-12-03 VITALS — BP 122/86 | HR 80 | Wt 186.0 lb

## 2023-12-03 DIAGNOSIS — O0990 Supervision of high risk pregnancy, unspecified, unspecified trimester: Secondary | ICD-10-CM | POA: Diagnosis not present

## 2023-12-03 DIAGNOSIS — Z8759 Personal history of other complications of pregnancy, childbirth and the puerperium: Secondary | ICD-10-CM

## 2023-12-03 DIAGNOSIS — O99619 Diseases of the digestive system complicating pregnancy, unspecified trimester: Secondary | ICD-10-CM | POA: Diagnosis not present

## 2023-12-03 DIAGNOSIS — K219 Gastro-esophageal reflux disease without esophagitis: Secondary | ICD-10-CM

## 2023-12-03 DIAGNOSIS — O09512 Supervision of elderly primigravida, second trimester: Secondary | ICD-10-CM

## 2023-12-03 MED ORDER — SUCRALFATE 1 G PO TABS: 1.0000 g | ORAL_TABLET | Freq: Three times a day (TID) | ORAL | 1 refills | Status: DC

## 2023-12-03 MED ORDER — ASPIRIN 81 MG PO TBEC: 81.0000 mg | DELAYED_RELEASE_TABLET | Freq: Every day | ORAL | 2 refills | Status: DC

## 2023-12-03 NOTE — Progress Notes (Signed)
   PRENATAL VISIT NOTE  Subjective:  Cindy Cruz is a 38 y.o. [phone] at [redacted]w[redacted]d being seen today for ongoing prenatal care.  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  Patient reports reflux symptoms, protonix not helping. Not taking aspirin because medicaid didn't cover it. Contractions: Not present. Vag. Bleeding: None.   . Denies leaking of fluid.   The following portions of the patient's history were reviewed and updated as appropriate: allergies, current medications, past family history, past medical history, past social history, past surgical history and problem list.   Objective:   Vitals:   12/03/23 1542  BP: 122/86  Pulse: 80  Weight: 186 lb (84.4 kg)   Body mass index is 30.95 kg/m. Total weight gain: 11 lb (4.99 kg)   Fetal Status: Fetal Heart Rate (bpm): 156         General:  Alert, oriented and cooperative. Patient is in no acute distress.  Skin: Skin is warm and dry. No rash noted.   Cardiovascular: Normal heart rate noted  Respiratory: Normal respiratory effort, no problems with respiration noted  Abdomen: Soft, gravid, appropriate for gestational age.  Pain/Pressure: Absent     Pelvic: Cervical exam deferred        Extremities: Normal range of motion.  Edema: None  Mental Status: Normal mood and affect. Normal behavior. Normal judgment and thought content.   Assessment and Plan:  Pregnancy: [id number] at [redacted]w[redacted]d 1. Supervision of high risk pregnancy, antepartum (Primary) Reviewed goodRx cost of aspirin is $2.80 at [facility] Pt plans to start Needs anatomy ultrasound scheduled, will do at checkout today - aspirin EC 81 MG tablet; Take 1 tablet (81 mg total) by mouth daily. Take after 12 weeks for prevention of  preeclampsia later in pregnancy  Dispense: 300 tablet; Refill: 2  2. Supervision of elderly primigravida, second trimester See above  3. History of gestational hypertension Aspirin, see above  4. Gastroesophageal reflux in pregnancy Will try carafate - sucralfate (CARAFATE) 1 g tablet; Take 1 tablet (1 g total) by mouth 3 (three) times daily with meals.  Dispense: 90 tablet; Refill: 1   Preterm labor symptoms and general obstetric precautions including but not limited to vaginal bleeding, contractions, leaking of fluid and fetal movement were reviewed in detail with the patient. Please refer to After Visit Summary for other counseling recommendations.   Return in about 4 weeks (around 12/31/2023).  No future appointments.  Marci Setter, MD

## 2023-12-17 ENCOUNTER — Other Ambulatory Visit: Payer: Self-pay

## 2023-12-17 MED ORDER — OMEPRAZOLE MAGNESIUM 20 MG PO TBEC: 20.0000 mg | DELAYED_RELEASE_TABLET | Freq: Every day | ORAL | 0 refills | Status: DC

## 2023-12-17 NOTE — Progress Notes (Signed)
 Returned call, pt stated that previous medications that were sent for heart burn have not worked, requesting alternative. Sent per protocol.

## 2023-12-26 ENCOUNTER — Other Ambulatory Visit: Payer: Self-pay

## 2023-12-26 MED ORDER — TERCONAZOLE 0.8 % VA CREA: 1.0000 | TOPICAL_CREAM | Freq: Every day | VAGINAL | 0 refills | Status: DC

## 2023-12-26 NOTE — Progress Notes (Signed)
 Terazol rx sent for yeast in pregnancy per protocol for pt symptoms.

## 2023-12-31 ENCOUNTER — Ambulatory Visit: Payer: MEDICAID | Admitting: Obstetrics and Gynecology

## 2023-12-31 ENCOUNTER — Other Ambulatory Visit (HOSPITAL_COMMUNITY)
Admission: RE | Admit: 2023-12-31 | Discharge: 2023-12-31 | Disposition: A | Discharge status: Home / Self Care | Payer: MEDICAID | Source: Ambulatory Visit | Attending: Obstetrics and Gynecology | Admitting: Obstetrics and Gynecology

## 2023-12-31 VITALS — BP 121/82 | HR 85 | Wt 191.0 lb

## 2023-12-31 DIAGNOSIS — N898 Other specified noninflammatory disorders of vagina: Secondary | ICD-10-CM | POA: Diagnosis present

## 2023-12-31 DIAGNOSIS — Z2839 Other underimmunization status: Secondary | ICD-10-CM

## 2023-12-31 DIAGNOSIS — O0990 Supervision of high risk pregnancy, unspecified, unspecified trimester: Secondary | ICD-10-CM

## 2023-12-31 DIAGNOSIS — O09522 Supervision of elderly multigravida, second trimester: Secondary | ICD-10-CM

## 2023-12-31 DIAGNOSIS — O09899 Supervision of other high risk pregnancies, unspecified trimester: Secondary | ICD-10-CM | POA: Diagnosis not present

## 2023-12-31 DIAGNOSIS — O09529 Supervision of elderly multigravida, unspecified trimester: Secondary | ICD-10-CM | POA: Insufficient documentation

## 2023-12-31 DIAGNOSIS — O26892 Other specified pregnancy related conditions, second trimester: Secondary | ICD-10-CM | POA: Insufficient documentation

## 2023-12-31 DIAGNOSIS — Z148 Genetic carrier of other disease: Secondary | ICD-10-CM

## 2023-12-31 DIAGNOSIS — Z641 Problems related to multiparity: Secondary | ICD-10-CM

## 2023-12-31 DIAGNOSIS — Z3A23 23 weeks gestation of pregnancy: Secondary | ICD-10-CM

## 2023-12-31 DIAGNOSIS — Z8759 Personal history of other complications of pregnancy, childbirth and the puerperium: Secondary | ICD-10-CM

## 2023-12-31 NOTE — Progress Notes (Signed)
   PRENATAL VISIT NOTE  Subjective:  Cindy Cruz is a 38 y.o. [phone] at [redacted]w[redacted]d being seen today for ongoing prenatal care.  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  Patient doing well with no acute concerns today. She reports no complaints.  Contractions: Not present. Vag. Bleeding: None.  Movement: Present. Denies leaking of fluid.   The following portions of the patient's history were reviewed and updated as appropriate: allergies, current medications, past family history, past medical history, past social history, past surgical history and problem list. Problem list updated.  Objective:   Vitals:   12/31/23 1103  BP: 121/82  Pulse: 85  Weight: 191 lb (86.6 kg)    Fetal Status: Fetal Heart Rate (bpm): 149   Movement: Present     General:  Alert, oriented and cooperative. Patient is in no acute distress.  Skin: Skin is warm and dry. No rash noted.   Cardiovascular: Normal heart rate noted  Respiratory: Normal respiratory effort, no problems with respiration noted  Abdomen: Soft, gravid, appropriate for gestational age.  Pain/Pressure: Absent     Pelvic: Cervical exam deferred        Extremities: Normal range of motion.  Edema: None  Mental Status:  Normal mood and affect. Normal behavior. Normal judgment and thought content.   Assessment and Plan:  Pregnancy: [id number] at [redacted]w[redacted]d  1. Vaginal discharge during pregnancy in second trimester (Primary) Self sawb performed - Cervicovaginal ancillary only( [place])  2. [redacted] weeks gestation of pregnancy   3. Supervision of high risk pregnancy, antepartum Continue routine prenatal care  4. Rubella non-immune status, antepartum Treat after  delivery  5. History of gestational hypertension No current hypertension  6. 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 Multigravida of advanced maternal age in second trimester   Preterm labor symptoms and general obstetric precautions including but not limited to vaginal bleeding, contractions, leaking of fluid and fetal movement were reviewed in detail with the patient.  Please refer to After Visit Summary for other counseling recommendations.   Return in about 4 weeks (around 01/28/2024) for [facility], in person, 2 hr GTT, 3rd trim labs.   Avie Boeck, MD Faculty Attending Center for [facility]

## 2023-12-31 NOTE — Progress Notes (Signed)
 ROB, Pt wants cultures, self swab done today.

## 2023-12-31 NOTE — Patient Instructions (Signed)
   Considering Waterbirth? Guide for patients at Center for [facility]) Why consider waterbirth? Gentle birth for babies  Less pain medicine used in labor  May allow for passive descent/less pushing  May reduce perineal tears  More mobility and instinctive maternal position changes  Increased maternal relaxation   Is waterbirth safe? What are the risks of infection, drowning or other complications? Infection:  Very low risk (3.7 % for tub vs 4.8% for bed)  7 in 8000 waterbirths with documented infection  Poorly cleaned equipment most common cause  Slightly lower group B strep transmission rate  Drowning  Maternal:  Very low risk  Related to seizures or fainting  Newborn:  Very low risk. No evidence of increased risk of respiratory problems in multiple large studies  Physiological protection from breathing under water  Avoid underwater birth if there are any fetal complications  Once baby's head is out of the water, keep it out.  Birth complication  Some reports of cord trauma, but risk decreased by bringing baby to surface gradually  No evidence of increased risk of shoulder dystocia. Mothers can usually change positions faster in water than in a bed, possibly aiding the maneuvers to free the shoulder.   There are 2 things you MUST do to have a waterbirth with [facility]: Attend a waterbirth class at [facility] at [facility]   3rd Wednesday of every month from 7-9 pm (virtual during COVID) [employer] at [email or web address] or [email or web address] or by calling [phone] Bring [place] the certificate from the class to your prenatal appointment or send via MyChart Meet with a midwife at 36 weeks* to see if you can still plan a waterbirth and to sign the consent.   *We also recommend that you schedule as many of your prenatal visits with a midwife as possible.    Helpful information: You may want to bring a bathing suit top to the hospital  to wear during labor but this is optional.  All other supplies are provided by the hospital. Please arrive at the hospital with signs of active labor, and do not wait at home until late in labor. It takes 45 min- 1 hour for fetal monitoring, and check in to your room to take place, plus transport and filling of the waterbirth tub.    Things that would prevent you from having a waterbirth: Premature, <37wks  Previous cesarean birth  Presence of thick meconium-stained fluid  Multiple gestation (Twins, triplets, etc.)  Uncontrolled diabetes or gestational diabetes requiring medication  Hypertension diagnosed in pregnancy or preexisting hypertension (gestational hypertension, preeclampsia, or chronic hypertension) Fetal growth restriction (your baby measures less than 10th percentile on ultrasound) Heavy vaginal bleeding  Non-reassuring fetal heart rate  Active infection (MRSA, etc.). Group B Strep is NOT a contraindication for waterbirth.  If your labor has to be induced and induction method requires continuous monitoring of the baby's heart rate  Other risks/issues identified by your obstetrical provider   Please remember that birth is unpredictable. Under certain unforeseeable circumstances your provider may advise against giving birth in the tub. These decisions will be made on a case-by-case basis and with the safety of you and your baby as our highest priority.    Updated 11/21/21

## 2024-01-01 LAB — CERVICOVAGINAL ANCILLARY ONLY
Bacterial Vaginitis (gardnerella): POSITIVE — AB
Candida Glabrata: NEGATIVE
Candida Vaginitis: NEGATIVE
Chlamydia: NEGATIVE
Comment: NEGATIVE
Comment: NEGATIVE
Comment: NEGATIVE
Comment: NEGATIVE
Comment: NEGATIVE
Comment: NORMAL
Neisseria Gonorrhea: NEGATIVE
Trichomonas: NEGATIVE

## 2024-01-02 ENCOUNTER — Ambulatory Visit: Payer: Self-pay | Admitting: Obstetrics and Gynecology

## 2024-01-05 ENCOUNTER — Other Ambulatory Visit: Payer: Self-pay

## 2024-01-05 MED ORDER — METRONIDAZOLE 500 MG PO TABS
500.0000 mg | ORAL_TABLET | Freq: Two times a day (BID) | ORAL | 0 refills | Status: DC
Start: 2024-01-05 — End: 2024-03-24

## 2024-01-19 ENCOUNTER — Other Ambulatory Visit: Payer: MEDICAID

## 2024-01-19 ENCOUNTER — Ambulatory Visit: Payer: MEDICAID

## 2024-01-19 DIAGNOSIS — Z8759 Personal history of other complications of pregnancy, childbirth and the puerperium: Secondary | ICD-10-CM

## 2024-01-19 DIAGNOSIS — O0990 Supervision of high risk pregnancy, unspecified, unspecified trimester: Secondary | ICD-10-CM

## 2024-01-19 DIAGNOSIS — Z2839 Other underimmunization status: Secondary | ICD-10-CM

## 2024-01-26 NOTE — Progress Notes (Deleted)
   PRENATAL VISIT NOTE  Subjective:  Cindy Cruz is a 38 y.o. [phone] at [redacted]w[redacted]d being seen today for ongoing prenatal care.  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  Patient reports {[id number]}.   .  .   . Denies leaking of fluid.   The following portions of the patient's history were reviewed and updated as appropriate: allergies, current medications, past family history, past medical history, past social history, past surgical history and problem list.   Objective:    There were no vitals filed for this visit.  Fetal Status:           General: Alert, oriented and cooperative. Patient is in no acute distress.  Skin: Skin is warm and dry. No rash noted.   Cardiovascular: Normal heart rate noted  Respiratory: Normal respiratory effort, no problems with respiration noted  Abdomen: Soft, gravid, appropriate for gestational age.        Pelvic: Cervical exam deferred        Extremities: Normal range of motion.     Mental Status: Normal mood and affect. Normal behavior. Normal judgment and thought content.   Assessment and Plan:  Pregnancy: [id number] at [redacted]w[redacted]d  1. Supervision of high risk pregnancy, antepartum (Primary) Patient doing well, feeling regular fetal movement  BP, FHR, FH appropriate  2. [redacted] weeks gestation of pregnancy Anticipatory guidance about next visits/weeks of pregnancy given.   3. History of gestational hypertension Continue ASA  Needs US ***  Preterm labor symptoms and general obstetric precautions including but not limited to vaginal bleeding, contractions, leaking of fluid and fetal movement were reviewed in detail with the  patient. Please refer to After Visit Summary for other counseling recommendations.   No follow-ups on file.  Future Appointments  Date Time Provider Department Center  01/28/2024  9:00 AM [facility] None  01/28/2024  9:55 AM Michi Herrmann E, PA-C [facility] None    Renelle Stegenga E Giavonni Fonder, PA-C

## 2024-01-28 ENCOUNTER — Other Ambulatory Visit: Payer: MEDICAID

## 2024-01-28 ENCOUNTER — Encounter: Payer: MEDICAID | Admitting: Physician Assistant

## 2024-01-28 DIAGNOSIS — Z3A27 27 weeks gestation of pregnancy: Secondary | ICD-10-CM

## 2024-01-28 DIAGNOSIS — O0990 Supervision of high risk pregnancy, unspecified, unspecified trimester: Secondary | ICD-10-CM

## 2024-01-28 DIAGNOSIS — Z8759 Personal history of other complications of pregnancy, childbirth and the puerperium: Secondary | ICD-10-CM

## 2024-02-05 ENCOUNTER — Encounter: Payer: MEDICAID | Admitting: Obstetrics

## 2024-02-05 ENCOUNTER — Other Ambulatory Visit: Payer: MEDICAID

## 2024-02-18 ENCOUNTER — Other Ambulatory Visit: Payer: MEDICAID

## 2024-02-18 ENCOUNTER — Ambulatory Visit: Payer: MEDICAID | Admitting: Family Medicine

## 2024-02-18 VITALS — BP 134/88 | HR 96 | Wt 187.6 lb

## 2024-02-18 DIAGNOSIS — O09893 Supervision of other high risk pregnancies, third trimester: Secondary | ICD-10-CM

## 2024-02-18 DIAGNOSIS — O09523 Supervision of elderly multigravida, third trimester: Secondary | ICD-10-CM

## 2024-02-18 DIAGNOSIS — Z23 Encounter for immunization: Secondary | ICD-10-CM | POA: Diagnosis not present

## 2024-02-18 DIAGNOSIS — Z2839 Other underimmunization status: Secondary | ICD-10-CM

## 2024-02-18 DIAGNOSIS — Z3A30 30 weeks gestation of pregnancy: Secondary | ICD-10-CM

## 2024-02-18 DIAGNOSIS — Z8759 Personal history of other complications of pregnancy, childbirth and the puerperium: Secondary | ICD-10-CM

## 2024-02-18 DIAGNOSIS — O0990 Supervision of high risk pregnancy, unspecified, unspecified trimester: Secondary | ICD-10-CM

## 2024-02-18 DIAGNOSIS — Z641 Problems related to multiparity: Secondary | ICD-10-CM

## 2024-02-18 DIAGNOSIS — O09899 Supervision of other high risk pregnancies, unspecified trimester: Secondary | ICD-10-CM

## 2024-02-18 NOTE — Progress Notes (Signed)
 Pt presents for rob. Pt has no questions or concerns at this time.

## 2024-02-18 NOTE — Patient Instructions (Signed)
   Considering Waterbirth? Guide for patients at Center for [facility]) Why consider waterbirth? Gentle birth for babies  Less pain medicine used in labor  May allow for passive descent/less pushing  May reduce perineal tears  More mobility and instinctive maternal position changes  Increased maternal relaxation   Is waterbirth safe? What are the risks of infection, drowning or other complications? Infection:  Very low risk (3.7 % for tub vs 4.8% for bed)  7 in 8000 waterbirths with documented infection  Poorly cleaned equipment most common cause  Slightly lower group B strep transmission rate  Drowning  Maternal:  Very low risk  Related to seizures or fainting  Newborn:  Very low risk. No evidence of increased risk of respiratory problems in multiple large studies  Physiological protection from breathing under water  Avoid underwater birth if there are any fetal complications  Once baby's head is out of the water, keep it out.  Birth complication  Some reports of cord trauma, but risk decreased by bringing baby to surface gradually  No evidence of increased risk of shoulder dystocia. Mothers can usually change positions faster in water than in a bed, possibly aiding the maneuvers to free the shoulder.   There are 2 things you MUST do to have a waterbirth with [facility]: Attend a waterbirth class at [facility] at [facility]   3rd Wednesday of every month from 7-9 pm (virtual during COVID) [employer] at [email or web address] or [email or web address] or by calling [phone] Bring [place] the certificate from the class to your prenatal appointment or send via MyChart Meet with a midwife at 36 weeks* to see if you can still plan a waterbirth and to sign the consent.   *We also recommend that you schedule as many of your prenatal visits with a midwife as possible.    Helpful information: You may want to bring a bathing suit top to the hospital  to wear during labor but this is optional.  All other supplies are provided by the hospital. Please arrive at the hospital with signs of active labor, and do not wait at home until late in labor. It takes 45 min- 1 hour for fetal monitoring, and check in to your room to take place, plus transport and filling of the waterbirth tub.    Things that would prevent you from having a waterbirth: Premature, <37wks  Previous cesarean birth  Presence of thick meconium-stained fluid  Multiple gestation (Twins, triplets, etc.)  Uncontrolled diabetes or gestational diabetes requiring medication  Hypertension diagnosed in pregnancy or preexisting hypertension (gestational hypertension, preeclampsia, or chronic hypertension) Fetal growth restriction (your baby measures less than 10th percentile on ultrasound) Heavy vaginal bleeding  Non-reassuring fetal heart rate  Active infection (MRSA, etc.). Group B Strep is NOT a contraindication for waterbirth.  If your labor has to be induced and induction method requires continuous monitoring of the baby's heart rate  Other risks/issues identified by your obstetrical provider   Please remember that birth is unpredictable. Under certain unforeseeable circumstances your provider may advise against giving birth in the tub. These decisions will be made on a case-by-case basis and with the safety of you and your baby as our highest priority.    Updated 11/21/21

## 2024-02-18 NOTE — Progress Notes (Signed)
   PRENATAL VISIT NOTE  Subjective:  Cindy Cruz is a 38 y.o. [phone] at [redacted]w[redacted]d being seen today for ongoing prenatal care.  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  Patient reports no complaints.  Contractions: Irritability. Vag. Bleeding: None.  Movement: Present. Denies leaking of fluid.   The following portions of the patient's history were reviewed and updated as appropriate: allergies, current medications, past family history, past medical history, past social history, past surgical history and problem list.   Objective:   Vitals:   02/18/24 1117  BP: 134/88  Pulse: 96  Weight: 187 lb 9.6 oz (85.1 kg)    Fetal Status: Fetal Heart Rate (bpm): 145 Fundal Height: 22 cm Movement: Present     General:  Alert, oriented and cooperative. Patient is in no acute distress.  Skin: Skin is warm and dry. No rash noted.   Cardiovascular: Normal heart rate noted  Respiratory: Normal respiratory effort, no problems with respiration noted  Abdomen: Soft, gravid, appropriate for gestational age.  Pain/Pressure: Absent     Pelvic: Cervical exam deferred        Extremities: Normal range of motion.  Edema: None  Mental Status: Normal mood and affect. Normal behavior. Normal judgment and thought content.   Assessment and Plan:  Pregnancy: [id number] at [redacted]w[redacted]d 1. Supervision of high risk pregnancy, antepartum (Primary) Prenatal course reviewed BP, HR, FHR normal Feeling regular FM  Tdap today Upcoming U/S on 7/8 - Glucose Tolerance, 2 Hours w/1 Hour - CBC - RPR - HIV Antibody (routine testing w rflx)  2. Hx gestational hypertension BP high normal today, discussed with patient Pre-eclampsia precautions  given Discussed that if she rules in for gHTN, would be risked out for water  birth  Preterm labor symptoms and general obstetric precautions including but not limited to vaginal bleeding, contractions, leaking of fluid and fetal movement were reviewed in detail with the patient. Please refer to After Visit Summary for other counseling recommendations.   Return in about 2 weeks (around 03/03/2024) for Routine prenatal care with CNM.  Future Appointments  Date Time Provider Department Center  02/24/2024  8:00 AM [facility] PROVIDER 1 [facility]  02/24/2024  8:30 AM [facility] US3 [facility]  03/03/2024 10:35 AM Zina Jerilynn LABOR, MD [facility] None    Alain Sor, MD

## 2024-02-19 LAB — CBC
Hematocrit: 31.9 % — ABNORMAL LOW (ref 34.0–46.6)
Hemoglobin: 10.4 g/dL — ABNORMAL LOW (ref 11.1–15.9)
MCH: 29.1 pg (ref 26.6–33.0)
MCHC: 32.6 g/dL (ref 31.5–35.7)
MCV: 89 fL (ref 79–97)
Platelets: 219 10*3/uL (ref 150–450)
RBC: 3.57 x10E6/uL — ABNORMAL LOW (ref 3.77–5.28)
RDW: 15.1 % (ref 11.7–15.4)
WBC: 6.6 10*3/uL (ref 3.4–10.8)

## 2024-02-19 LAB — RPR: RPR Ser Ql: NONREACTIVE

## 2024-02-19 LAB — GLUCOSE TOLERANCE, 2 HOURS W/ 1HR
Glucose, 1 hour: 121 mg/dL (ref 70–179)
Glucose, 2 hour: 89 mg/dL (ref 70–152)
Glucose, Fasting: 76 mg/dL (ref 70–91)

## 2024-02-19 LAB — HIV ANTIBODY (ROUTINE TESTING W REFLEX): HIV Screen 4th Generation wRfx: NONREACTIVE

## 2024-02-24 ENCOUNTER — Ambulatory Visit: Payer: MEDICAID

## 2024-02-24 ENCOUNTER — Other Ambulatory Visit: Payer: Self-pay | Admitting: *Deleted

## 2024-02-24 ENCOUNTER — Ambulatory Visit: Payer: MEDICAID | Attending: Obstetrics and Gynecology | Admitting: Obstetrics and Gynecology

## 2024-02-24 VITALS — BP 120/78 | HR 91

## 2024-02-24 DIAGNOSIS — O09893 Supervision of other high risk pregnancies, third trimester: Secondary | ICD-10-CM

## 2024-02-24 DIAGNOSIS — Z3A31 31 weeks gestation of pregnancy: Secondary | ICD-10-CM | POA: Diagnosis not present

## 2024-02-24 DIAGNOSIS — Z148 Genetic carrier of other disease: Secondary | ICD-10-CM | POA: Insufficient documentation

## 2024-02-24 DIAGNOSIS — Z8759 Personal history of other complications of pregnancy, childbirth and the puerperium: Secondary | ICD-10-CM | POA: Diagnosis not present

## 2024-02-24 DIAGNOSIS — O09523 Supervision of elderly multigravida, third trimester: Secondary | ICD-10-CM

## 2024-02-24 DIAGNOSIS — Z3689 Encounter for other specified antenatal screening: Secondary | ICD-10-CM | POA: Diagnosis present

## 2024-02-24 DIAGNOSIS — O0993 Supervision of high risk pregnancy, unspecified, third trimester: Secondary | ICD-10-CM | POA: Diagnosis not present

## 2024-02-24 DIAGNOSIS — Z2839 Other underimmunization status: Secondary | ICD-10-CM | POA: Diagnosis not present

## 2024-02-24 DIAGNOSIS — O99213 Obesity complicating pregnancy, third trimester: Secondary | ICD-10-CM

## 2024-02-24 DIAGNOSIS — O09293 Supervision of pregnancy with other poor reproductive or obstetric history, third trimester: Secondary | ICD-10-CM

## 2024-02-24 DIAGNOSIS — O0943 Supervision of pregnancy with grand multiparity, third trimester: Secondary | ICD-10-CM

## 2024-02-24 DIAGNOSIS — O0990 Supervision of high risk pregnancy, unspecified, unspecified trimester: Secondary | ICD-10-CM

## 2024-02-24 DIAGNOSIS — E669 Obesity, unspecified: Secondary | ICD-10-CM

## 2024-02-24 NOTE — Progress Notes (Signed)
 Maternal-Fetal Medicine Consultation Name: Cindy Cruz MRN: [id number]  [id number] at 31w 4d gestation.  Patient is here for fetal anatomy scan.   Advanced maternal age.  On cell-free fetal DNA screening, the risks of fetal aneuploidies are not increased.  MSAFP screening showed low risk for open neural tube defects.  Patient does not have gestational diabetes. Blood pressure today at our office is 120/78 mmHg.  Obstetrical history significant for 7 term vaginal deliveries.  Her last pregnancy was complicated by gestational hypertension/preeclampsia.  Patient has not started taking low-dose aspirin  as prescribed.  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  None of her female children is affected.  Ultrasound Fetal growth is appropriate for gestational age.  Amniotic fluid is normal good fetal activity seen.  Fetal anatomical survey appears normal but limited by advanced gestational age.  Fetal sex is female.  I counseled the patient on history of gestational hypertension that can recur and 25% to 50% of subsequent pregnancies.  Starting low-dose aspirin  prophylaxis later in pregnancy may not be beneficial.  Aspirin  delays or prevents preeclampsia.  I counseled the patient there is a 50% chance that her female children are likely to be affected with Duchenne muscular dystrophy.  I recommended genetic counseling before planning her next pregnancy.  Recommendations - An appointment was made for her to return in 4 weeks for fetal growth assessment.  Consultation including face-to-face (more than 50%) counseling 15 minutes.

## 2024-03-01 ENCOUNTER — Encounter (HOSPITAL_COMMUNITY): Payer: Self-pay | Admitting: Family Medicine

## 2024-03-01 ENCOUNTER — Inpatient Hospital Stay (HOSPITAL_COMMUNITY)
Admission: AD | Admit: 2024-03-01 | Discharge: 2024-03-01 | Disposition: A | Discharge status: Home / Self Care | Payer: MEDICAID | Attending: Obstetrics & Gynecology | Admitting: Obstetrics & Gynecology

## 2024-03-01 DIAGNOSIS — B3731 Acute candidiasis of vulva and vagina: Secondary | ICD-10-CM | POA: Diagnosis not present

## 2024-03-01 DIAGNOSIS — Z8759 Personal history of other complications of pregnancy, childbirth and the puerperium: Secondary | ICD-10-CM | POA: Insufficient documentation

## 2024-03-01 DIAGNOSIS — O26893 Other specified pregnancy related conditions, third trimester: Secondary | ICD-10-CM | POA: Diagnosis not present

## 2024-03-01 DIAGNOSIS — Z2839 Other underimmunization status: Secondary | ICD-10-CM

## 2024-03-01 DIAGNOSIS — R030 Elevated blood-pressure reading, without diagnosis of hypertension: Secondary | ICD-10-CM | POA: Diagnosis not present

## 2024-03-01 DIAGNOSIS — R21 Rash and other nonspecific skin eruption: Secondary | ICD-10-CM | POA: Diagnosis present

## 2024-03-01 DIAGNOSIS — O98813 Other maternal infectious and parasitic diseases complicating pregnancy, third trimester: Secondary | ICD-10-CM | POA: Diagnosis not present

## 2024-03-01 DIAGNOSIS — Z113 Encounter for screening for infections with a predominantly sexual mode of transmission: Secondary | ICD-10-CM | POA: Diagnosis present

## 2024-03-01 DIAGNOSIS — O09293 Supervision of pregnancy with other poor reproductive or obstetric history, third trimester: Secondary | ICD-10-CM | POA: Diagnosis not present

## 2024-03-01 DIAGNOSIS — O99333 Smoking (tobacco) complicating pregnancy, third trimester: Secondary | ICD-10-CM | POA: Diagnosis not present

## 2024-03-01 DIAGNOSIS — Z3A32 32 weeks gestation of pregnancy: Secondary | ICD-10-CM | POA: Diagnosis not present

## 2024-03-01 DIAGNOSIS — B3732 Chronic candidiasis of vulva and vagina: Secondary | ICD-10-CM | POA: Diagnosis present

## 2024-03-01 DIAGNOSIS — F17210 Nicotine dependence, cigarettes, uncomplicated: Secondary | ICD-10-CM | POA: Insufficient documentation

## 2024-03-01 LAB — WET PREP, GENITAL
Clue Cells Wet Prep HPF POC: NONE SEEN
Sperm: NONE SEEN
Trich, Wet Prep: NONE SEEN
WBC, Wet Prep HPF POC: <10 (ref <10)

## 2024-03-01 LAB — CBC
HCT: 29.3 % — ABNORMAL LOW (ref 36.0–46.0)
Hemoglobin: 9.7 g/dL — ABNORMAL LOW (ref 12.0–15.0)
MCH: 29 pg (ref 26.0–34.0)
MCHC: 33.1 g/dL (ref 30.0–36.0)
MCV: 87.5 fL (ref 80.0–100.0)
Platelets: 208 K/uL (ref 150–400)
RBC: 3.35 MIL/uL — ABNORMAL LOW (ref 3.87–5.11)
RDW: 16.1 % — ABNORMAL HIGH (ref 11.5–15.5)
WBC: 5.4 K/uL (ref 4.0–10.5)
nRBC: 0 % (ref 0.0–0.2)

## 2024-03-01 LAB — URINALYSIS, ROUTINE W REFLEX MICROSCOPIC
Bilirubin Urine: NEGATIVE
Glucose, UA: NEGATIVE mg/dL
Hgb urine dipstick: NEGATIVE
Ketones, ur: NEGATIVE mg/dL
Nitrite: NEGATIVE
Protein, ur: NEGATIVE mg/dL
Specific Gravity, Urine: 1.015 (ref 1.005–1.030)
pH: 6 (ref 5.0–8.0)

## 2024-03-01 LAB — COMPREHENSIVE METABOLIC PANEL WITH GFR
ALT: 16 U/L (ref 0–44)
AST: 28 U/L (ref 15–41)
Albumin: 2.5 g/dL — ABNORMAL LOW (ref 3.5–5.0)
Alkaline Phosphatase: 78 U/L (ref 38–126)
Anion gap: 9 (ref 5–15)
BUN: <5 mg/dL — ABNORMAL LOW (ref 6–20)
CO2: 21 mmol/L — ABNORMAL LOW (ref 22–32)
Calcium: 8.5 mg/dL — ABNORMAL LOW (ref 8.9–10.3)
Chloride: 105 mmol/L (ref 98–111)
Creatinine, Ser: 0.58 mg/dL (ref 0.44–1.00)
GFR, Estimated: >60 mL/min (ref >60)
Glucose, Bld: 93 mg/dL (ref 70–99)
Potassium: 3 mmol/L — ABNORMAL LOW (ref 3.5–5.1)
Sodium: 135 mmol/L (ref 135–145)
Total Bilirubin: 0.4 mg/dL (ref 0.0–1.2)
Total Protein: 6 g/dL — ABNORMAL LOW (ref 6.5–8.1)

## 2024-03-01 LAB — PROTEIN / CREATININE RATIO, URINE
Creatinine, Urine: 123 mg/dL
Protein Creatinine Ratio: 0.13 mg/mg{creat} (ref 0.00–0.15)
Total Protein, Urine: 16 mg/dL

## 2024-03-01 MED ORDER — FLUCONAZOLE 150 MG PO TABS: 150.0000 mg | ORAL_TABLET | Freq: Once | ORAL | 0 refills | Status: AC

## 2024-03-01 MED ORDER — FLUCONAZOLE 150 MG PO TABS: 150.0000 mg | ORAL_TABLET | Freq: Once | ORAL | Status: DC

## 2024-03-01 MED ORDER — FLUCONAZOLE 150 MG PO TABS
150.0000 mg | ORAL_TABLET | Freq: Once | ORAL | Status: AC
  Administered 2024-03-01: 150 mg via ORAL
  Filled 2024-03-01: qty 1

## 2024-03-01 NOTE — MAU Provider Note (Addendum)
 MAU Provider Note  Chief Complaint: Vaginal Itching     SUBJECTIVE HPI: Cindy Cruz is a 38 y.o. [id number] at [redacted]w[redacted]d by LMP who presents to maternity admissions reporting vaginal irritation. Pregnancy c/b hx of gHTN. Receives [facility] with [facility]. Previous BV and yeast in this pregnancy. Several days ago she started having increased discharge, itching, and vaginal pain with penetrative intercourse. Most irritation is in the vulvar region and not intravaginal.   No headache, one episode of floaters today. Some SOB but not significant. Increased swelling in feet and ankles. No RUQ pain.  No vaginal bleeding. No leakage of fluid. 1-2 contractions per day. Good fetal movement.   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   1:1 titer   Depression    ok now   GERD (gastroesophageal reflux disease)    indigestion with pregnancy only; Rx med helps; pt cannot remember name of med   Trichomoniasis    Past Surgical History:  Procedure Laterality Date   WISDOM TOOTH EXTRACTION     Social History   Socioeconomic History   Marital status: Single    Spouse name: Not on file   Number of children: 6   Years of education: 38   Highest education level: 12th grade  Occupational History   Not on file  Tobacco Use   Smoking status: Some Days    Current packs/day: 0.25    Average packs/day: 0.3 packs/day for 10.0 years (2.5 ttl pk-yrs)    Types: Cigarettes   Smokeless tobacco: Never  Vaping Use   Vaping status: Some Days  Substance and Sexual Activity   Alcohol use: Not Currently    Comment: socially on occ.-liquor   Drug use: No   Sexual activity: Yes    Birth control/protection: None  Other Topics Concern   Not on file  Social History Narrative   Not on file   Social Drivers of Health   Financial Resource Strain: Not on file  Food Insecurity: Not on file  Transportation Needs: Not on file  Physical Activity: Not on file  Stress: Not on  file  Social Connections: Not on file  Intimate Partner Violence: Not on file   No current facility-administered medications on file prior to encounter.   Current Outpatient Medications on File Prior to Encounter  Medication Sig Dispense Refill   Prenatal Vit-Fe Fumarate-FA (PREPLUS) 27-1 MG TABS Take 1 tablet by mouth daily. 30 tablet 13   aspirin  EC 81 MG tablet Take 1 tablet (81 mg total) by mouth daily. Take after 12 weeks for prevention of preeclampsia later in pregnancy (Patient not taking: Reported on 02/24/2024) 300 tablet 2   Blood Pressure Monitoring (BLOOD PRESSURE KIT) DEVI 1 kit by Does not apply route once a week. Check Blood Pressure regularly and record readings into the Babyscripts App.  Large Cuff.  DX O90.0 (Patient not taking: Reported on 02/24/2024) 1 each 0   famotidine  (PEPCID ) 20 MG tablet Take 1 tablet (20 mg total) by mouth 2 (two) times daily. 60 tablet 12   metroNIDAZOLE  (FLAGYL ) 500 MG tablet Take 1 tablet (500 mg total) by mouth 2 (two) times daily. (Patient not taking: Reported on 02/18/2024) 14 tablet 0   Misc. Devices (GOJJI WEIGHT SCALE) MISC 1 Device by Does not apply route once a week. Check BP weekly and enter data into the Baby scripts app 1 each 0   omeprazole  (PRILOSEC  OTC) 20 MG tablet Take 1 tablet (20  mg total) by mouth daily. (Patient not taking: Reported on 02/24/2024) 30 tablet 0   Prenatal 28-0.8 MG TABS Take 1 tablet by mouth daily. (Patient not taking: Reported on 02/18/2024) 30 tablet 12   promethazine  (PHENERGAN ) 25 MG tablet Take 1 tablet (25 mg total) by mouth every 6 (six) hours as needed for nausea or vomiting. 30 tablet 1   sucralfate  (CARAFATE ) 1 g tablet Take 1 tablet (1 g total) by mouth 3 (three) times daily with meals. 90 tablet 1   terconazole  (TERAZOL 3 ) 0.8 % vaginal cream Place 1 applicator vaginally at bedtime. Apply nightly for three nights. (Patient not taking: Reported on 02/24/2024) 20 g 0   No Known Allergies  ROS:  Pertinent  positives/negatives listed above.  I have reviewed patient's Past Medical Hx, Surgical Hx, Family Hx, Social Hx, medications and allergies.   Physical Exam  Patient Vitals for the past 24 hrs:  BP Temp Pulse Resp SpO2 Height Weight  03/01/24 2035 -- -- -- -- 100 % -- --  03/01/24 2030 138/88 -- 78 -- 100 % -- --  03/01/24 2025 -- -- -- -- 99 % -- --  03/01/24 2020 -- -- -- -- 100 % -- --  03/01/24 2015 (!) 146/88 -- 84 -- 99 % -- --  03/01/24 2010 -- -- -- -- 99 % -- --  03/01/24 2005 -- -- -- -- 100 % -- --  03/01/24 2000 (!) 147/97 -- 99 -- 100 % -- --  03/01/24 1955 -- -- -- -- 100 % -- --  03/01/24 1950 -- -- -- -- 99 % -- --  03/01/24 1945 (!) 144/100 -- 84 -- 99 % -- --  03/01/24 1940 -- -- -- -- 100 % -- --  03/01/24 1935 -- -- -- -- 100 % -- --  03/01/24 1930 -- -- -- -- 100 % -- --  03/01/24 1925 (!) 141/87 -- 81 -- 95 % -- --  03/01/24 1840 (!) 144/90 -- 86 -- -- -- --  03/01/24 1833 (!) 156/94 98.1 F (36.7 C) 90 18 -- 5' 5 (1.651 m) 84.8 kg   Constitutional: Well-developed, well-nourished female in no acute distress  Cardiovascular: normal rate, no m/r/g Respiratory: normal effort, CTAB GI: Abd soft, non-tender MS: no edema Neurologic: Alert and oriented x 4   FHT:  Baseline 135 , moderate variability, accelerations present, no decelerations Contractions: none   LAB RESULTS Results for orders placed or performed during the hospital encounter of 03/01/24 (from the past 24 hours)  Wet prep, genital     Status: Abnormal   Collection Time: 03/01/24  7:10 PM  Result Value Ref Range   Yeast Wet Prep HPF POC PRESENT (A) NONE SEEN   Trich, Wet Prep NONE SEEN NONE SEEN   Clue Cells Wet Prep HPF POC NONE SEEN NONE SEEN   WBC, Wet Prep HPF POC <10 <10   Sperm NONE SEEN   Urinalysis, Routine w reflex microscopic -Urine, Clean Catch     Status: Abnormal   Collection Time: 03/01/24  7:10 PM  Result Value Ref Range   Color, Urine YELLOW YELLOW   APPearance HAZY (A)  CLEAR   Specific Gravity, Urine 1.015 1.005 - 1.030   pH 6.0 5.0 - 8.0   Glucose, UA NEGATIVE NEGATIVE mg/dL   Hgb urine dipstick NEGATIVE NEGATIVE   Bilirubin Urine NEGATIVE NEGATIVE   Ketones, ur NEGATIVE NEGATIVE mg/dL   Protein, ur NEGATIVE NEGATIVE mg/dL   Nitrite NEGATIVE NEGATIVE  Leukocytes,Ua LARGE (A) NEGATIVE   RBC / HPF 0-5 0 - 5 RBC/hpf   WBC, UA 6-10 0 - 5 WBC/hpf   Bacteria, UA RARE (A) NONE SEEN   Squamous Epithelial / HPF 0-5 0 - 5 /HPF   Mucus PRESENT   Protein / creatinine ratio, urine     Status: None   Collection Time: 03/01/24  7:10 PM  Result Value Ref Range   Creatinine, Urine 123 mg/dL   Total Protein, Urine 16 mg/dL   Protein Creatinine Ratio 0.13 0.00 - 0.15 mg/mg[Cre]  CBC     Status: Abnormal   Collection Time: 03/01/24  7:31 PM  Result Value Ref Range   WBC 5.4 4.0 - 10.5 K/uL   RBC 3.35 (L) 3.87 - 5.11 MIL/uL   Hemoglobin 9.7 (L) 12.0 - 15.0 g/dL   HCT 70.6 (L) 63.9 - 53.9 %   MCV 87.5 80.0 - 100.0 fL   MCH 29.0 26.0 - 34.0 pg   MCHC 33.1 30.0 - 36.0 g/dL   RDW 83.8 (H) 88.4 - 84.4 %   Platelets 208 150 - 400 K/uL   nRBC 0.0 0.0 - 0.2 %  Comprehensive metabolic panel with GFR     Status: Abnormal   Collection Time: 03/01/24  7:31 PM  Result Value Ref Range   Sodium 135 135 - 145 mmol/L   Potassium 3.0 (L) 3.5 - 5.1 mmol/L   Chloride 105 98 - 111 mmol/L   CO2 21 (L) 22 - 32 mmol/L   Glucose, Bld 93 70 - 99 mg/dL   BUN <5 (L) 6 - 20 mg/dL   Creatinine, Ser 9.41 0.44 - 1.00 mg/dL   Calcium 8.5 (L) 8.9 - 10.3 mg/dL   Total Protein 6.0 (L) 6.5 - 8.1 g/dL   Albumin 2.5 (L) 3.5 - 5.0 g/dL   AST 28 15 - 41 U/L   ALT 16 0 - 44 U/L   Alkaline Phosphatase 78 38 - 126 U/L   Total Bilirubin 0.4 0.0 - 1.2 mg/dL   GFR, Estimated >39 >39 mL/min   Anion gap 9 5 - 15    O/Positive/-- (03/18 0913)  IMAGING No imaging obtained this visit  MAU Management/MDM: Orders Placed This Encounter  Procedures   Wet prep, genital   Urinalysis, Routine w  reflex microscopic -Urine, Clean Catch   CBC   Comprehensive metabolic panel with GFR   Protein / creatinine ratio, urine    Meds ordered this encounter  Medications   DISCONTD: fluconazole  (DIFLUCAN ) tablet 150 mg   fluconazole  (DIFLUCAN ) tablet 150 mg    Available prenatal records reviewed.  - UA, wet prep, GC ordered - wet prep consistent with yeast infection - mild range BP during encounter but no previous elevated this pregnancy, history of gHTN in last pregnancy, continue cycling BP  - extensively counseled the patient on hypertensive disorders of pregnancy - CBC, CMP urine P/C ratio ordered and pending - care handed off to Dr. Loyola at 2000  ASSESSMENT 1. Yeast infection of the vagina   2. History of gestational hypertension   3. Rubella non-immune status, antepartum   4. [redacted] weeks gestation of pregnancy     PLAN - care handed off to Dr. Jersee Winiarski at 2000  Candida - treat with single dose Diflucan   Elevated BP without diagnosis of GHTN, thought very likely.  - Asymptomatic, labs wnl - Continue home BP monitoring - Strict return precautions - Continue with prenatal visit 7/16  Discharge home with strict  return precautions  Allergies as of 03/01/2024   No Known Allergies      Medication List     TAKE these medications    aspirin  EC 81 MG tablet Take 1 tablet (81 mg total) by mouth daily. Take after 12 weeks for prevention of preeclampsia later in pregnancy   Blood Pressure Kit Devi 1 kit by Does not apply route once a week. Check Blood Pressure regularly and record readings into the Babyscripts App.  Large Cuff.  DX O90.0   famotidine  20 MG tablet Commonly known as: Pepcid  Take 1 tablet (20 mg total) by mouth 2 (two) times daily.   fluconazole  150 MG tablet Commonly known as: DIFLUCAN  Take 1 tablet (150 mg total) by mouth once for 1 dose. Take after 7/16 if symptoms not resolved.   Gojji Weight Scale Misc 1 Device by Does not apply route once a week.  Check BP weekly and enter data into the Baby scripts app   metroNIDAZOLE  500 MG tablet Commonly known as: Flagyl  Take 1 tablet (500 mg total) by mouth 2 (two) times daily.   omeprazole  20 MG tablet Commonly known as: PriLOSEC  OTC Take 1 tablet (20 mg total) by mouth daily.   Prenatal 28-0.8 MG Tabs Take 1 tablet by mouth daily.   PrePLUS 27-1 MG Tabs Take 1 tablet by mouth daily.   promethazine  25 MG tablet Commonly known as: PHENERGAN  Take 1 tablet (25 mg total) by mouth every 6 (six) hours as needed for nausea or vomiting.   sucralfate  1 g tablet Commonly known as: Carafate  Take 1 tablet (1 g total) by mouth 3 (three) times daily with meals.   terconazole  0.8 % vaginal cream Commonly known as: TERAZOL 3  Place 1 applicator vaginally at bedtime. Apply nightly for three nights.        Cindy Shropshire, MD Family Medicine PGY1 03/01/2024  8:49 PM

## 2024-03-01 NOTE — MAU Note (Signed)
 Cindy Cruz is a 38 y.o. at [redacted]w[redacted]d here in MAU reporting: Vaginal irritation 2-3 days. Has had recurrent yeast infections. Denies ay abd pain cramping or ctx. Good fetal movement reported.  Reports breaking out around her umbilicus and right side and on her legs. Lei t is better now.  Pt reports she has been seeing some spots and increased swelling in her feet.   LMP:  Onset of complaint: 2-3 Pain score: 0 Vitals:   03/01/24 1833 03/01/24 1840  BP: (!) 156/94 (!) 144/90  Pulse: 90 86  Resp: 18   Temp: 98.1 F (36.7 C)      FHT: 131  Lab orders placed from triage:

## 2024-03-02 LAB — GC/CHLAMYDIA PROBE AMP (~~LOC~~) NOT AT ARMC
Chlamydia: NEGATIVE
Comment: NEGATIVE
Comment: NORMAL
Neisseria Gonorrhea: NEGATIVE

## 2024-03-03 ENCOUNTER — Encounter: Payer: MEDICAID | Admitting: Obstetrics and Gynecology

## 2024-03-15 ENCOUNTER — Encounter: Payer: MEDICAID | Admitting: Obstetrics & Gynecology

## 2024-03-24 ENCOUNTER — Ambulatory Visit (INDEPENDENT_AMBULATORY_CARE_PROVIDER_SITE_OTHER): Payer: MEDICAID | Admitting: Obstetrics

## 2024-03-24 ENCOUNTER — Encounter (HOSPITAL_COMMUNITY): Payer: Self-pay | Admitting: Obstetrics and Gynecology

## 2024-03-24 ENCOUNTER — Inpatient Hospital Stay (HOSPITAL_COMMUNITY): Payer: MEDICAID

## 2024-03-24 ENCOUNTER — Encounter: Payer: MEDICAID | Admitting: Obstetrics & Gynecology

## 2024-03-24 ENCOUNTER — Encounter: Payer: Self-pay | Admitting: Obstetrics

## 2024-03-24 ENCOUNTER — Other Ambulatory Visit: Payer: Self-pay

## 2024-03-24 ENCOUNTER — Inpatient Hospital Stay (HOSPITAL_COMMUNITY)
Admission: AD | Admit: 2024-03-24 | Discharge: 2024-03-29 | DRG: 768 | Disposition: A | Discharge status: Home / Self Care | Payer: MEDICAID | Attending: Obstetrics and Gynecology | Admitting: Obstetrics and Gynecology

## 2024-03-24 ENCOUNTER — Other Ambulatory Visit (HOSPITAL_COMMUNITY)
Admission: RE | Admit: 2024-03-24 | Discharge: 2024-03-24 | Disposition: A | Discharge status: Home / Self Care | Payer: MEDICAID | Source: Ambulatory Visit | Attending: Obstetrics | Admitting: Obstetrics

## 2024-03-24 VITALS — BP 179/109 | HR 85 | Wt 185.0 lb

## 2024-03-24 DIAGNOSIS — Z3483 Encounter for supervision of other normal pregnancy, third trimester: Secondary | ICD-10-CM | POA: Insufficient documentation

## 2024-03-24 DIAGNOSIS — O99334 Smoking (tobacco) complicating childbirth: Secondary | ICD-10-CM | POA: Diagnosis present

## 2024-03-24 DIAGNOSIS — O152 Eclampsia in the puerperium: Secondary | ICD-10-CM | POA: Diagnosis not present

## 2024-03-24 DIAGNOSIS — O0990 Supervision of high risk pregnancy, unspecified, unspecified trimester: Secondary | ICD-10-CM

## 2024-03-24 DIAGNOSIS — O9081 Anemia of the puerperium: Secondary | ICD-10-CM | POA: Diagnosis not present

## 2024-03-24 DIAGNOSIS — O1493 Unspecified pre-eclampsia, third trimester: Secondary | ICD-10-CM | POA: Diagnosis present

## 2024-03-24 DIAGNOSIS — O09523 Supervision of elderly multigravida, third trimester: Secondary | ICD-10-CM

## 2024-03-24 DIAGNOSIS — O1414 Severe pre-eclampsia complicating childbirth: Principal | ICD-10-CM | POA: Diagnosis present

## 2024-03-24 DIAGNOSIS — Z3A35 35 weeks gestation of pregnancy: Secondary | ICD-10-CM | POA: Insufficient documentation

## 2024-03-24 DIAGNOSIS — J9601 Acute respiratory failure with hypoxia: Secondary | ICD-10-CM | POA: Diagnosis not present

## 2024-03-24 DIAGNOSIS — F17210 Nicotine dependence, cigarettes, uncomplicated: Secondary | ICD-10-CM | POA: Diagnosis present

## 2024-03-24 DIAGNOSIS — O9962 Diseases of the digestive system complicating childbirth: Secondary | ICD-10-CM | POA: Diagnosis present

## 2024-03-24 DIAGNOSIS — O119 Pre-existing hypertension with pre-eclampsia, unspecified trimester: Secondary | ICD-10-CM

## 2024-03-24 DIAGNOSIS — O99284 Endocrine, nutritional and metabolic diseases complicating childbirth: Secondary | ICD-10-CM | POA: Diagnosis present

## 2024-03-24 DIAGNOSIS — N939 Abnormal uterine and vaginal bleeding, unspecified: Secondary | ICD-10-CM

## 2024-03-24 DIAGNOSIS — R578 Other shock: Secondary | ICD-10-CM | POA: Diagnosis not present

## 2024-03-24 DIAGNOSIS — D62 Acute posthemorrhagic anemia: Secondary | ICD-10-CM | POA: Diagnosis not present

## 2024-03-24 DIAGNOSIS — O99344 Other mental disorders complicating childbirth: Secondary | ICD-10-CM | POA: Diagnosis present

## 2024-03-24 DIAGNOSIS — J9621 Acute and chronic respiratory failure with hypoxia: Secondary | ICD-10-CM | POA: Diagnosis not present

## 2024-03-24 DIAGNOSIS — J9611 Chronic respiratory failure with hypoxia: Secondary | ICD-10-CM

## 2024-03-24 DIAGNOSIS — F32A Depression, unspecified: Secondary | ICD-10-CM | POA: Diagnosis present

## 2024-03-24 DIAGNOSIS — O99824 Streptococcus B carrier state complicating childbirth: Secondary | ICD-10-CM | POA: Diagnosis present

## 2024-03-24 DIAGNOSIS — R079 Chest pain, unspecified: Secondary | ICD-10-CM | POA: Diagnosis present

## 2024-03-24 DIAGNOSIS — I10 Essential (primary) hypertension: Secondary | ICD-10-CM | POA: Diagnosis not present

## 2024-03-24 DIAGNOSIS — Z641 Problems related to multiparity: Secondary | ICD-10-CM | POA: Diagnosis not present

## 2024-03-24 DIAGNOSIS — O1413 Severe pre-eclampsia, third trimester: Secondary | ICD-10-CM

## 2024-03-24 DIAGNOSIS — O99820 Streptococcus B carrier state complicating pregnancy: Secondary | ICD-10-CM | POA: Diagnosis not present

## 2024-03-24 DIAGNOSIS — O133 Gestational [pregnancy-induced] hypertension without significant proteinuria, third trimester: Secondary | ICD-10-CM

## 2024-03-24 DIAGNOSIS — R569 Unspecified convulsions: Secondary | ICD-10-CM | POA: Diagnosis not present

## 2024-03-24 DIAGNOSIS — Z2839 Other underimmunization status: Secondary | ICD-10-CM

## 2024-03-24 DIAGNOSIS — O09899 Supervision of other high risk pregnancies, unspecified trimester: Secondary | ICD-10-CM

## 2024-03-24 DIAGNOSIS — Z148 Genetic carrier of other disease: Secondary | ICD-10-CM

## 2024-03-24 DIAGNOSIS — Z8759 Personal history of other complications of pregnancy, childbirth and the puerperium: Secondary | ICD-10-CM

## 2024-03-24 DIAGNOSIS — K219 Gastro-esophageal reflux disease without esophagitis: Secondary | ICD-10-CM | POA: Diagnosis present

## 2024-03-24 DIAGNOSIS — D696 Thrombocytopenia, unspecified: Secondary | ICD-10-CM | POA: Diagnosis present

## 2024-03-24 DIAGNOSIS — Z8249 Family history of ischemic heart disease and other diseases of the circulatory system: Secondary | ICD-10-CM | POA: Diagnosis not present

## 2024-03-24 DIAGNOSIS — O99214 Obesity complicating childbirth: Secondary | ICD-10-CM | POA: Diagnosis present

## 2024-03-24 DIAGNOSIS — Z3A36 36 weeks gestation of pregnancy: Secondary | ICD-10-CM

## 2024-03-24 DIAGNOSIS — O09529 Supervision of elderly multigravida, unspecified trimester: Secondary | ICD-10-CM

## 2024-03-24 LAB — COMPREHENSIVE METABOLIC PANEL WITH GFR
ALT: 23 U/L (ref 0–44)
AST: 34 U/L (ref 15–41)
Albumin: 2.9 g/dL — ABNORMAL LOW (ref 3.5–5.0)
Alkaline Phosphatase: 123 U/L (ref 38–126)
Anion gap: 9 (ref 5–15)
BUN: <5 mg/dL — ABNORMAL LOW (ref 6–20)
CO2: 21 mmol/L — ABNORMAL LOW (ref 22–32)
Calcium: 9 mg/dL (ref 8.9–10.3)
Chloride: 104 mmol/L (ref 98–111)
Creatinine, Ser: 0.62 mg/dL (ref 0.44–1.00)
GFR, Estimated: >60 mL/min
Glucose, Bld: 94 mg/dL (ref 70–99)
Potassium: 3.2 mmol/L — ABNORMAL LOW (ref 3.5–5.1)
Sodium: 134 mmol/L — ABNORMAL LOW (ref 135–145)
Total Bilirubin: 0.7 mg/dL (ref 0.0–1.2)
Total Protein: 7.3 g/dL (ref 6.5–8.1)

## 2024-03-24 LAB — TROPONIN I (HIGH SENSITIVITY): Troponin I (High Sensitivity): 14 ng/L (ref <18)

## 2024-03-24 LAB — CBC
HCT: 34.9 % — ABNORMAL LOW (ref 36.0–46.0)
Hemoglobin: 11.3 g/dL — ABNORMAL LOW (ref 12.0–15.0)
MCH: 28.3 pg (ref 26.0–34.0)
MCHC: 32.4 g/dL (ref 30.0–36.0)
MCV: 87.3 fL (ref 80.0–100.0)
Platelets: 275 10*3/uL (ref 150–400)
RBC: 4 MIL/uL (ref 3.87–5.11)
RDW: 15.6 % — ABNORMAL HIGH (ref 11.5–15.5)
WBC: 5.4 10*3/uL (ref 4.0–10.5)
nRBC: 0 % (ref 0.0–0.2)

## 2024-03-24 LAB — PROTEIN / CREATININE RATIO, URINE
Creatinine, Urine: 416 mg/dL
Protein Creatinine Ratio: 0.1 mg/mg{creat} (ref 0.00–0.15)
Total Protein, Urine: 41 mg/dL

## 2024-03-24 LAB — GROUP B STREP BY PCR: Group B strep by PCR: POSITIVE — AB

## 2024-03-24 LAB — BRAIN NATRIURETIC PEPTIDE: B Natriuretic Peptide: 104.8 pg/mL — ABNORMAL HIGH (ref 0.0–100.0)

## 2024-03-24 MED ORDER — LACTATED RINGERS IV SOLN: INTRAVENOUS | Status: AC

## 2024-03-24 MED ORDER — EPHEDRINE 5 MG/ML INJ: 10.0000 mg | INTRAVENOUS | Status: DC | PRN

## 2024-03-24 MED ORDER — LACTATED RINGERS IV BOLUS: 1000.0000 mL | Freq: Once | INTRAVENOUS | Status: DC

## 2024-03-24 MED ORDER — LIDOCAINE HCL (PF) 1 % IJ SOLN: 30.0000 mL | INTRAMUSCULAR | Status: DC | PRN

## 2024-03-24 MED ORDER — LABETALOL HCL 5 MG/ML IV SOLN
20.0000 mg | INTRAVENOUS | Status: DC | PRN
  Administered 2024-03-24 – 2024-03-27 (×2): 20 mg via INTRAVENOUS
  Filled 2024-03-24 (×2): qty 4

## 2024-03-24 MED ORDER — SODIUM CHLORIDE 0.9 % IV SOLN
5.0000 10*6.[IU] | Freq: Once | INTRAVENOUS | Status: AC
  Administered 2024-03-24: 5 10*6.[IU] via INTRAVENOUS
  Filled 2024-03-24: qty 5

## 2024-03-24 MED ORDER — OXYTOCIN-SODIUM CHLORIDE 30-0.9 UT/500ML-% IV SOLN
2.5000 [IU]/h | INTRAVENOUS | Status: DC
  Filled 2024-03-24: qty 500

## 2024-03-24 MED ORDER — DIPHENHYDRAMINE HCL 50 MG/ML IJ SOLN: 12.5000 mg | INTRAMUSCULAR | Status: DC | PRN

## 2024-03-24 MED ORDER — LACTATED RINGERS IV SOLN: INTRAVENOUS | Status: DC

## 2024-03-24 MED ORDER — PHENYLEPHRINE 80 MCG/ML (10ML) SYRINGE FOR IV PUSH (FOR BLOOD PRESSURE SUPPORT)
80.0000 ug | PREFILLED_SYRINGE | INTRAVENOUS | Status: AC | PRN
  Administered 2024-03-26 (×3): 80 ug via INTRAVENOUS
  Filled 2024-03-24: qty 10

## 2024-03-24 MED ORDER — ONDANSETRON HCL 4 MG/2ML IJ SOLN
4.0000 mg | Freq: Four times a day (QID) | INTRAMUSCULAR | Status: DC | PRN
  Administered 2024-03-25 – 2024-03-27 (×6): 4 mg via INTRAVENOUS
  Filled 2024-03-24 (×5): qty 2

## 2024-03-24 MED ORDER — LABETALOL HCL 5 MG/ML IV SOLN
80.0000 mg | INTRAVENOUS | Status: DC | PRN
  Administered 2024-03-24: 80 mg via INTRAVENOUS
  Filled 2024-03-24: qty 16

## 2024-03-24 MED ORDER — SOD CITRATE-CITRIC ACID 500-334 MG/5ML PO SOLN
30.0000 mL | ORAL | Status: DC | PRN
  Administered 2024-03-24 – 2024-03-27 (×3): 30 mL via ORAL
  Filled 2024-03-24 (×4): qty 30

## 2024-03-24 MED ORDER — ACETAMINOPHEN 325 MG PO TABS
650.0000 mg | ORAL_TABLET | ORAL | Status: DC | PRN
  Administered 2024-03-25: 650 mg via ORAL
  Filled 2024-03-24: qty 2

## 2024-03-24 MED ORDER — PENICILLIN G POT IN DEXTROSE 60000 UNIT/ML IV SOLN
3.0000 10*6.[IU] | INTRAVENOUS | Status: DC
  Administered 2024-03-25 – 2024-03-26 (×8): 3 10*6.[IU] via INTRAVENOUS
  Filled 2024-03-24 (×9): qty 50

## 2024-03-24 MED ORDER — HYDRALAZINE HCL 20 MG/ML IJ SOLN: 10.0000 mg | INTRAMUSCULAR | Status: DC | PRN

## 2024-03-24 MED ORDER — FENTANYL-BUPIVACAINE-NACL 0.5-0.125-0.9 MG/250ML-% EP SOLN
12.0000 mL/h | EPIDURAL | Status: DC | PRN
  Administered 2024-03-25: 12 mL/h via EPIDURAL
  Filled 2024-03-24: qty 250

## 2024-03-24 MED ORDER — MISOPROSTOL 50MCG HALF TABLET
50.0000 ug | ORAL_TABLET | ORAL | Status: DC
  Administered 2024-03-24 – 2024-03-26 (×4): 50 ug via ORAL
  Filled 2024-03-24 (×5): qty 1

## 2024-03-24 MED ORDER — LABETALOL HCL 5 MG/ML IV SOLN
40.0000 mg | INTRAVENOUS | Status: DC | PRN
  Administered 2024-03-24: 40 mg via INTRAVENOUS
  Filled 2024-03-24 (×2): qty 8

## 2024-03-24 MED ORDER — OXYTOCIN BOLUS FROM INFUSION
333.0000 mL | Freq: Once | INTRAVENOUS | Status: AC
  Administered 2024-03-26: 333 mL via INTRAVENOUS

## 2024-03-24 MED ORDER — FAMOTIDINE IN NACL 20-0.9 MG/50ML-% IV SOLN
20.0000 mg | Freq: Two times a day (BID) | INTRAVENOUS | Status: DC
  Administered 2024-03-24 – 2024-03-27 (×5): 20 mg via INTRAVENOUS
  Filled 2024-03-24 (×5): qty 50

## 2024-03-24 MED ORDER — MISOPROSTOL 25 MCG QUARTER TABLET
25.0000 ug | ORAL_TABLET | Freq: Once | ORAL | Status: AC
Start: 2024-03-24 — End: 2024-03-24
  Administered 2024-03-24: 25 ug via VAGINAL
  Filled 2024-03-24: qty 1

## 2024-03-24 MED ORDER — EPHEDRINE 5 MG/ML INJ
10.0000 mg | INTRAVENOUS | Status: DC | PRN
  Administered 2024-03-26: 25 mg via INTRAVENOUS
  Filled 2024-03-24 (×2): qty 5

## 2024-03-24 MED ORDER — MAGNESIUM SULFATE 40 GM/1000ML IV SOLN
2.0000 g/h | INTRAVENOUS | Status: AC
  Administered 2024-03-24 – 2024-03-25 (×2): 2 g/h via INTRAVENOUS
  Filled 2024-03-24 (×3): qty 1000

## 2024-03-24 MED ORDER — FENTANYL CITRATE (PF) 100 MCG/2ML IJ SOLN
50.0000 ug | INTRAMUSCULAR | Status: DC | PRN
  Administered 2024-03-25: 100 ug via INTRAVENOUS
  Filled 2024-03-24: qty 2

## 2024-03-24 MED ORDER — LACTATED RINGERS IV SOLN
500.0000 mL | Freq: Once | INTRAVENOUS | Status: AC
  Administered 2024-03-25: 500 mL via INTRAVENOUS

## 2024-03-24 MED ORDER — LACTATED RINGERS IV SOLN
500.0000 mL | INTRAVENOUS | Status: DC | PRN
  Administered 2024-03-26: 1000 mL via INTRAVENOUS

## 2024-03-24 MED ORDER — ZOLPIDEM TARTRATE 5 MG PO TABS
5.0000 mg | ORAL_TABLET | Freq: Once | ORAL | Status: AC
  Administered 2024-03-24: 5 mg via ORAL
  Filled 2024-03-24: qty 1

## 2024-03-24 MED ORDER — MAGNESIUM SULFATE BOLUS VIA INFUSION
4.0000 g | Freq: Once | INTRAVENOUS | Status: AC
  Administered 2024-03-24: 4 g via INTRAVENOUS
  Filled 2024-03-24: qty 1000

## 2024-03-24 MED ORDER — PHENYLEPHRINE 80 MCG/ML (10ML) SYRINGE FOR IV PUSH (FOR BLOOD PRESSURE SUPPORT)
80.0000 ug | PREFILLED_SYRINGE | INTRAVENOUS | Status: AC | PRN
  Administered 2024-03-26 (×2): 80 ug via INTRAVENOUS
  Filled 2024-03-24 (×2): qty 10

## 2024-03-24 MED ORDER — ACETAMINOPHEN-CAFFEINE 500-65 MG PO TABS: 2.0000 | ORAL_TABLET | Freq: Once | ORAL | Status: DC

## 2024-03-24 NOTE — Progress Notes (Signed)
 Subjective:  Cindy Cruz is a 38 y.o. [phone] at [redacted]w[redacted]d being seen today for ongoing prenatal care.  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  Patient reports swelling of feet, headache, occasional contractions, and visual scotomata.  Contractions: Irregular. Vag. Bleeding: None.  Movement: Present. Denies leaking of fluid.   The following portions of the patient's history were reviewed and updated as appropriate: allergies, current medications, past family history, past medical history, past social history, past surgical history and problem list. Problem list updated.  Objective:   Vitals:   03/24/24 1450 03/24/24 1526  BP: (!) 149/109 (!) 179/109  Pulse: 85   Weight: 185 lb (83.9 kg)     Fetal Status: Fetal Heart Rate (bpm): 136   Movement: Present   visusl spots  General:  Alert, oriented and cooperative. Patient is in no acute distress.  Skin: Skin is warm and dry. No rash noted.   Cardiovascular: Normal heart rate noted  Respiratory: Normal respiratory effort, no problems with respiration noted  Abdomen: Soft, gravid, appropriate for gestational age. Pain/Pressure: Present     Pelvic:  Cervical exam deferred        Extremities: Normal range of motion.  Edema: None  Mental Status: Normal mood and affect. Normal behavior. Normal judgment and thought content.   Urinalysis:      Assessment and Plan:  Pregnancy: [id number] at [redacted]w[redacted]d  1. Supervision of high risk pregnancy, antepartum (Primary)  2. [redacted] weeks gestation of pregnancy Rx: - Culture, beta strep (group b only) - Cervicovaginal ancillary only( [place])  3. Multigravida of advanced maternal age in third trimester  4. Grand  multipara  5. History of gestational hypertension  6. Pre-eclampsia superimposed on chronic hypertension, antepartum - patient sent to MAU for pre-eclampsia with severe features    Preterm labor symptoms and general obstetric precautions including but not limited to vaginal bleeding, contractions, leaking of fluid and fetal movement were reviewed in detail with the patient. Please refer to After Visit Summary for other counseling recommendations.   Return in about 1 week (around 03/31/2024) for [facility].   Rudy Carlin LABOR, MD 01/25/2024

## 2024-03-24 NOTE — MAU Provider Note (Signed)
 MAU Provider Assessment Unit    S [name] is a 38 y.o. [phone] pregnant female with a history of gHTN at [redacted]w[redacted]d who presents to MAU today with complaint of severe range blood pressures at home and in the office, visual changes, and lower extremity swelling.    BP in the office today was 149/109 and 179/109.  She reports systolic blood pressures in the 160s and 170s at home over the last week. She denies a hx of elevated BP outside of pregnancy. She has intermittent bright flashes and spots in her vision for several weeks with no recent change unrelated to headaches, and note occasional headaches.  She notes swelling in her feet when she is on her feet for long periods of time-reports today swelling has been doing well.  No shortness of breath, no right upper quadrant pain.  The last 3 days she has had contractions every 30 to 60 minutes.  + Fetal movement, no vaginal bleeding, no vaginal discharge, no leaking of fluid  Receives care at [facility]. Prenatal records reviewed.  Pertinent items noted in HPI and remainder of comprehensive ROS otherwise negative.   O BP (!) 166/108   Pulse 87   Temp 98.5 F (36.9 C) (Oral)   Resp 16   LMP 07/18/2023 (Approximate)   SpO2 100%  Physical Exam Constitutional:      General: She is not in acute distress.    Appearance: She is not toxic-appearing.  Cardiovascular:     Rate and Rhythm: Normal rate and regular rhythm.  Pulmonary:     Effort: Pulmonary effort is normal. No respiratory distress.     Breath sounds: Normal breath sounds. No wheezing, rhonchi or rales.  Abdominal:     General: Bowel sounds are normal.     Comments: Gravid uterus  Skin:    General: Skin is warm and dry.  Neurological:     Mental Status: She is alert.    MAU Course: 1655 FHT: 135bpm with baseline change to 140bpm, moderate variability, +accelerations, no decelerations. 1 contraction while provider in the room, not picked up on toco. PEC labs  pending Growth US  ordered  Dr. Jhonny assessing the patient. She is now c/o of chest tightness. Troponin, BNP, EKG, 1V CXR, and IV pepcid  ordered  Fetal position: vertex by leopold's  1700 Severe Range BP of 169/97, noted. Magnesium  started.   EKG: NSR rate of 85bpm, occasional PAC, no significant ST elevation/depression, QTc  1715  Due to history of severe range blood pressures at home over the last week, severe range BP in the office, and during observation in the MAU patient had another severe range BP. She adamantly denies hx of HTN outside of pregnancy. She meets criteria for admission with gHTN now with severe range blood pressures with need for treatment with IV magnesium  and IOL is indicated as she is past 34wga. Discussed with Dr. Zina (attending) and Dr. Magali (OB fellow) for admission. Dr. Zina graciously agreed to admit the patient.  GBS culture ordered in the office today, but will order GBS PCR for more rapid result given the change in clinical scenario.   Confirmed vertex by Dr. Jhonny with ultrasound  1715 received 20mg  IV labetalol  for severe range BPs in the MAU.  1739 received 40mg  IV labetalol  for continued severe range BPs in the MAU.  1751 received 80mg  IV labetalol  for continued severe range BP  Patient's blood pressures improved. Now stable for transfer to the floor.  MDM: High risk   A   ICD-10-CM   1. Supervision of high risk pregnancy, antepartum  O09.90     2. History of gestational hypertension  Z87.59     3. Rubella non-immune status, antepartum  O09.899    Z28.39     4. Gestational hypertension, third trimester  O13.3     5. Severe pre-eclampsia in third trimester  O14.13      Medical screening exam complete  P Admit to L&D for  gHTN with severe range blood pressures  (possible pre-eclampsia with severe feature results still pending) Admission is clinically indicated as she requires fetal monitoring, BP monitoring, and treatment  with IOL, Mg, IV medication for BP control.   Pr/Cr ratio, BNP, CXR, troponin, growth US  pending.   Allergies: No known drug allergies  Cindy Leeroy NOVAK, MD 03/24/2024 5:38 PM

## 2024-03-24 NOTE — Progress Notes (Signed)
 LABOR PROGRESS NOTE Cindy Cruz is a 39 y.o. female [phone] with IUP at [redacted]w[redacted]d (dated by LMP, Estimated Date of Delivery: 04/23/24) presenting for pre-eclampsia with severe features.   In to meet patient at 33.  Denies SD, OVD in prior vagdel. Pelvis proven 3575g.  Patient comfortable. Discussed PPBC, pt considering depo for convenience discussed considering Nexplanon or IUD as other set it and forget it option. SCE: closed & long  FHT: baseline 130, mod variability, +accels, -decels; overall category I. Toco: not contracting  A/P:  IOL dual miso, recheck in 4h Mag protocol GBS+ AROM after 2300 Epidural PRN   Cindy Cruz S Mailen Newborn, DO 7:36 PM

## 2024-03-24 NOTE — Progress Notes (Signed)
 Pt informed that the ultrasound is considered a limited OB ultrasound and is not intended to be a complete ultrasound exam.  Patient also informed that the ultrasound is not being completed with the intent of assessing for fetal or placental anomalies or any pelvic abnormalities.  Explained that the purpose of today's ultrasound is to assess for  presentation.  Patient acknowledges the purpose of the exam and the limitations of the study.    My interpretation: vertex   Augustin Slade, MD Family Medicine - Obstetrics Fellow

## 2024-03-24 NOTE — MAU Note (Signed)
 Cindy Cruz is a 38 y.o. at [redacted]w[redacted]d here in MAU reporting: HA on and off, visual spots today,   LMP:  Onset of complaint: on and off for a while Pain score: 6/10 Vitals:   03/24/24 1612 03/24/24 1615  BP: (!) 155/102 (!) 141/99  Pulse: 94 91  Resp: 16   Temp: 98.5 F (36.9 C)   SpO2:  98%     FHT: monitors applied  Lab orders placed from triage: na

## 2024-03-24 NOTE — Progress Notes (Signed)
 Took B/P this morning 160/1??. Has been having visual floaters. Feet sweling at times.

## 2024-03-24 NOTE — H&P (Signed)
 OBSTETRIC ADMISSION HISTORY AND PHYSICAL  Cindy Cruz is a 38 y.o. female [phone] with IUP at [redacted]w[redacted]d (dated by LMP, Estimated Date of Delivery: 04/23/24) presenting for pre-eclampsia with severe features.   She reports +FMs, No LOF, no VB, complains of vision changes, and chest pain denies headaches or peripheral edema, and RUQ pain.    She plans on breast and bottle feeding. She request either patch or OCP for birth control.  She received her prenatal care at [facility]   Prenatal History/Complications: Rubella non-immune, severe range BP's   Past Medical History: 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   1:1 titer   Depression    ok now   GERD (gastroesophageal reflux disease)    indigestion with pregnancy only; Rx med helps; pt cannot remember name of med   Trichomoniasis     Past Surgical History: Past Surgical History:  Procedure Laterality Date   WISDOM TOOTH EXTRACTION      Obstetrical History: OB History     Gravida  9   Para  7   Term  7   Preterm  0   AB  1   Living  7      SAB  0   IAB  0   Ectopic  0   Multiple  0   Live Births  7           Social History Social History   Socioeconomic History   Marital status: Single    Spouse name: Not on file   Number of children: 6   Years of education: 12   Highest education level: 12th grade  Occupational History   Not on file  Tobacco Use   Smoking status: Some Days    Current packs/day: 0.25    Average packs/day: 0.3 packs/day for 10.0 years (2.5 ttl pk-yrs)    Types: Cigarettes   Smokeless tobacco: Never  Vaping Use   Vaping status: Some Days  Substance and Sexual Activity   Alcohol use: Not Currently    Comment: socially on occ.-liquor   Drug use: No   Sexual activity: Yes    Birth control/protection: None  Other Topics Concern   Not on file  Social History Narrative   Not on file   Social Drivers of Health   Financial  Resource Strain: Not on file  Food Insecurity: No Food Insecurity (03/24/2024)   Hunger Vital Sign    Worried About Running Out of Food in the Last Year: Never true    Ran Out of Food in the Last Year: Never true  Transportation Needs: No Transportation Needs (03/24/2024)   PRAPARE - Administrator, Civil Service (Medical): No    Lack of Transportation (Non-Medical): No  Physical Activity: Not on file  Stress: Not on file  Social Connections: Not on file    Family History: Family History  Problem Relation Age of Onset   Hypertension Mother        died age 43, ? cause   Cancer Maternal Aunt        breast   Hearing loss Neg Hx     Allergies: No Known Allergies  Medications Prior to Admission  Medication Sig Dispense Refill Last Dose/Taking   famotidine  (PEPCID ) 20 MG tablet Take 1 tablet (20 mg total) by mouth 2 (two) times daily. 60 tablet 12 Past Month   Prenatal Vit-Fe Fumarate-FA (PREPLUS) 27-1 MG TABS Take 1 tablet by  mouth daily. 30 tablet 13 Past Week   promethazine  (PHENERGAN ) 25 MG tablet Take 1 tablet (25 mg total) by mouth every 6 (six) hours as needed for nausea or vomiting. 30 tablet 1 Past Week   sucralfate  (CARAFATE ) 1 g tablet Take 1 tablet (1 g total) by mouth 3 (three) times daily with meals. 90 tablet 1 Past Month   aspirin  EC 81 MG tablet Take 1 tablet (81 mg total) by mouth daily. Take after 12 weeks for prevention of preeclampsia later in pregnancy (Patient not taking: Reported on 02/24/2024) 300 tablet 2    Blood Pressure Monitoring (BLOOD PRESSURE KIT) DEVI 1 kit by Does not apply route once a week. Check Blood Pressure regularly and record readings into the Babyscripts App.  Large Cuff.  DX O90.0 1 each 0    metroNIDAZOLE  (FLAGYL ) 500 MG tablet Take 1 tablet (500 mg total) by mouth 2 (two) times daily. (Patient not taking: Reported on 02/18/2024) 14 tablet 0    Misc. Devices (GOJJI WEIGHT SCALE) MISC 1 Device by Does not apply route once a week. Check BP  weekly and enter data into the Baby scripts app 1 each 0    omeprazole  (PRILOSEC  OTC) 20 MG tablet Take 1 tablet (20 mg total) by mouth daily. (Patient not taking: Reported on 02/24/2024) 30 tablet 0    Prenatal 28-0.8 MG TABS Take 1 tablet by mouth daily. (Patient not taking: Reported on 02/18/2024) 30 tablet 12    terconazole  (TERAZOL 3 ) 0.8 % vaginal cream Place 1 applicator vaginally at bedtime. Apply nightly for three nights. (Patient not taking: Reported on 02/24/2024) 20 g 0      Review of Systems  All systems reviewed and negative except as stated in HPI.  Blood pressure (!) 166/108, pulse 87, temperature 98.5 F (36.9 C), temperature source Oral, resp. rate 16, last menstrual period 07/18/2023, SpO2 100%, unknown if currently breastfeeding. General appearance: alert and cooperative Lungs: breathing comfortably on room air Heart: regular rate Abdomen: soft, non-tender; gravid, Extremities: no edema of bilateral lower extremities DTR's intact Presentation: cephalic Fetal monitoringBaseline: 130 bpm, Variability: Good {> 6 bpm), Accelerations: Reactive, and Decelerations: Absent Uterine activityNone     Prenatal labs: ABO, Rh: O/Positive/-- (03/18 0913) Antibody: Negative (03/18 0913) Rubella: 0.94 (03/18 0913) RPR: Non Reactive (07/02 0939)  HBsAg: Negative (03/18 0913)  HIV: Non Reactive (07/02 0939)  GBS:   unknown 2 hr Glucola passed Genetic screening  NIPS LR, AFP (-) Anatomy US  02/24/24 appears normal Last US : At [redacted]w[redacted]d - cephalic presentation, EFW 1656g (19 %tile), AC 18%  Prenatal Transfer Tool  Maternal Diabetes: No Genetic Screening: Normal Maternal Ultrasounds/Referrals: Normal Fetal Ultrasounds or other Referrals:  None Maternal Substance Abuse:  No Significant Maternal Medications:  None Significant Maternal Lab Results:  Other: GBS unknown Number of Prenatal Visits:greater than 3 verified prenatal visits Other Comments:  None  Results for orders placed or  performed during the hospital encounter of 03/24/24 (from the past 24 hours)  CBC   Collection Time: 03/24/24  4:06 PM  Result Value Ref Range   WBC 5.4 4.0 - 10.5 K/uL   RBC 4.00 3.87 - 5.11 MIL/uL   Hemoglobin 11.3 (L) 12.0 - 15.0 g/dL   HCT 65.0 (L) 63.9 - 53.9 %   MCV 87.3 80.0 - 100.0 fL   MCH 28.3 26.0 - 34.0 pg   MCHC 32.4 30.0 - 36.0 g/dL   RDW 84.3 (H) 88.4 - 84.4 %   Platelets 275 150 -  400 K/uL   nRBC 0.0 0.0 - 0.2 %  Comprehensive metabolic panel   Collection Time: 03/24/24  4:06 PM  Result Value Ref Range   Sodium 134 (L) 135 - 145 mmol/L   Potassium 3.2 (L) 3.5 - 5.1 mmol/L   Chloride 104 98 - 111 mmol/L   CO2 21 (L) 22 - 32 mmol/L   Glucose, Bld 94 70 - 99 mg/dL   BUN <5 (L) 6 - 20 mg/dL   Creatinine, Ser 9.37 0.44 - 1.00 mg/dL   Calcium  9.0 8.9 - 10.3 mg/dL   Total Protein 7.3 6.5 - 8.1 g/dL   Albumin  2.9 (L) 3.5 - 5.0 g/dL   AST 34 15 - 41 U/L   ALT 23 0 - 44 U/L   Alkaline Phosphatase 123 38 - 126 U/L   Total Bilirubin 0.7 0.0 - 1.2 mg/dL   GFR, Estimated >39 >39 mL/min   Anion gap 9 5 - 15    Patient Active Problem List   Diagnosis Date Noted   Advanced maternal age in multigravida 12/31/2023   Rubella non-immune status, antepartum 11/07/2023   History of gestational hypertension 11/04/2023   Supervision of high risk pregnancy, antepartum 11/03/2023   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 [id number] at [redacted]w[redacted]d here for IOL due to pre-eclampsia with severe features.   #Labor: Discussed IOL due to PEC. Consider ripening with Cytotec  and augmentation with FB/AROM/Pit when appropriate.   #Pain: Per Patient Request #FWB: Cat I tracing #ID:  GBS unknown, test pending -> PCN #MOF: Breat/Bottle #MOC: Patch vs OCP #Circ:  Yes if female #PEC w/ SF: PIH labs in progress, magnesium , labetalol  protocol.  #Chest pain: ECG interpreted as NSR, troponin, BNP, CXR  pending   Barkley Angles, MD OB Fellow, Faculty Practice [facility], Center for [facility] 03/24/2024 5:30 PM

## 2024-03-25 ENCOUNTER — Inpatient Hospital Stay (HOSPITAL_COMMUNITY): Payer: MEDICAID | Admitting: Anesthesiology

## 2024-03-25 ENCOUNTER — Encounter (HOSPITAL_COMMUNITY): Payer: Self-pay | Admitting: Obstetrics and Gynecology

## 2024-03-25 LAB — CBC
HCT: 32.4 % — ABNORMAL LOW (ref 36.0–46.0)
HCT: 32.1 % — ABNORMAL LOW (ref 36.0–46.0)
Hemoglobin: 10.7 g/dL — ABNORMAL LOW (ref 12.0–15.0)
Hemoglobin: 10.6 g/dL — ABNORMAL LOW (ref 12.0–15.0)
MCH: 28.4 pg (ref 26.0–34.0)
MCH: 28.5 pg (ref 26.0–34.0)
MCHC: 33 g/dL (ref 30.0–36.0)
MCHC: 33 g/dL (ref 30.0–36.0)
MCV: 85.9 fL (ref 80.0–100.0)
MCV: 86.3 fL (ref 80.0–100.0)
Platelets: 257 K/uL (ref 150–400)
Platelets: 241 K/uL (ref 150–400)
RBC: 3.77 MIL/uL — ABNORMAL LOW (ref 3.87–5.11)
RBC: 3.72 MIL/uL — ABNORMAL LOW (ref 3.87–5.11)
RDW: 15.5 % (ref 11.5–15.5)
RDW: 15.6 % — ABNORMAL HIGH (ref 11.5–15.5)
WBC: 7.5 K/uL (ref 4.0–10.5)
WBC: 6.4 K/uL (ref 4.0–10.5)
nRBC: 0 % (ref 0.0–0.2)
nRBC: 0 % (ref 0.0–0.2)

## 2024-03-25 LAB — COMPREHENSIVE METABOLIC PANEL WITH GFR
ALT: 16 U/L (ref 0–44)
AST: 23 U/L (ref 15–41)
Albumin: 2.5 g/dL — ABNORMAL LOW (ref 3.5–5.0)
Alkaline Phosphatase: 99 U/L (ref 38–126)
Anion gap: 7 (ref 5–15)
BUN: <5 mg/dL — ABNORMAL LOW (ref 6–20)
CO2: 24 mmol/L (ref 22–32)
Calcium: 7.2 mg/dL — ABNORMAL LOW (ref 8.9–10.3)
Chloride: 104 mmol/L (ref 98–111)
Creatinine, Ser: 0.69 mg/dL (ref 0.44–1.00)
GFR, Estimated: >60 mL/min (ref >60)
Glucose, Bld: 96 mg/dL (ref 70–99)
Potassium: 3.6 mmol/L (ref 3.5–5.1)
Sodium: 135 mmol/L (ref 135–145)
Total Bilirubin: 0.3 mg/dL (ref 0.0–1.2)
Total Protein: 6.3 g/dL — ABNORMAL LOW (ref 6.5–8.1)

## 2024-03-25 LAB — RPR: RPR Ser Ql: NONREACTIVE

## 2024-03-25 MED ORDER — LIDOCAINE HCL (PF) 1 % IJ SOLN
INTRAMUSCULAR | Status: DC | PRN
  Administered 2024-03-25: 8 mL via EPIDURAL

## 2024-03-25 MED ORDER — TRANEXAMIC ACID-NACL 1000-0.7 MG/100ML-% IV SOLN
1000.0000 mg | Freq: Once | INTRAVENOUS | Status: AC
  Administered 2024-03-26: 1000 mg via INTRAVENOUS
  Filled 2024-03-25: qty 100

## 2024-03-25 MED ORDER — OXYTOCIN-SODIUM CHLORIDE 30-0.9 UT/500ML-% IV SOLN
1.0000 m[IU]/min | INTRAVENOUS | Status: DC
  Administered 2024-03-25: 2 m[IU]/min via INTRAVENOUS
  Filled 2024-03-25: qty 500

## 2024-03-25 MED ORDER — SODIUM CHLORIDE 0.9 % IV SOLN
25.0000 mg | Freq: Once | INTRAVENOUS | Status: AC
  Administered 2024-03-25: 25 mg via INTRAVENOUS
  Filled 2024-03-25 (×2): qty 1

## 2024-03-25 MED ORDER — BUTALBITAL-APAP-CAFFEINE 50-325-40 MG PO TABS
1.0000 | ORAL_TABLET | Freq: Four times a day (QID) | ORAL | Status: DC | PRN
  Administered 2024-03-25: 1 via ORAL
  Filled 2024-03-25 (×2): qty 1

## 2024-03-25 NOTE — Anesthesia Preprocedure Evaluation (Signed)
 Anesthesia Evaluation  Patient identified by MRN, date of birth, ID band Patient awake    Reviewed: Allergy & Precautions, H&P , Patient's Chart, lab work & pertinent test results  Airway Mallampati: II  TM Distance: >3 FB Neck ROM: Full    Dental no notable dental hx.    Pulmonary Current Smoker   Pulmonary exam normal breath sounds clear to auscultation       Cardiovascular hypertension (PreE SF on mag), Normal cardiovascular exam Rhythm:Regular Rate:Normal     Neuro/Psych  PSYCHIATRIC DISORDERS  Depression    negative neurological ROS     GI/Hepatic Neg liver ROS,GERD  ,,  Endo/Other  BMI 31  Renal/GU negative Renal ROS  negative genitourinary   Musculoskeletal negative musculoskeletal ROS (+)    Abdominal   Peds negative pediatric ROS (+)  Hematology  (+) Blood dyscrasia, anemia Hb 10.7, plt 257   Anesthesia Other Findings   Reproductive/Obstetrics (+) Pregnancy Grand multip G9P7 35 5/7 weeks                              Anesthesia Physical Anesthesia Plan  ASA: 3  Anesthesia Plan: Epidural   Post-op Pain Management:    Induction:   PONV Risk Score and Plan: 2  Airway Management Planned: Natural Airway  Additional Equipment: None  Intra-op Plan:   Post-operative Plan:   Informed Consent: I have reviewed the patients History and Physical, chart, labs and discussed the procedure including the risks, benefits and alternatives for the proposed anesthesia with the patient or authorized representative who has indicated his/her understanding and acceptance.       Plan Discussed with:   Anesthesia Plan Comments:         Anesthesia Quick Evaluation

## 2024-03-25 NOTE — Progress Notes (Signed)
 Labor Progress Note Cindy Cruz is a 39 y.o. [phone] at [redacted]w[redacted]d presented for IOL in setting of PEC w SF  S: No questions at this time  O:  BP 126/72   Pulse 73   Temp 97.6 F (36.4 C) (Oral)   Resp 18   Ht 5' 5 (1.651 m)   Wt 83.9 kg   LMP 07/18/2023 (Approximate)   SpO2 100%   BMI 30.78 kg/m  EFM: 120/mod/-a/-d  CVE: Dilation: 2 Effacement (%): 50 Station: Ballotable Presentation: Vertex Exam by:: Rosina Pereyra RN   A&P: 38 y.o. [id number] [redacted]w[redacted]d here for IOL  #Labor: Progressing well. Discussed role of FB w pt, pt amenable to placement. FB placed w 40cc in uterine balloon. Pt tolerated well. Will start low dose Pitocin  (cap at 6 until FB out) #Pain: Epidural #FWB: Cat I #GBS positive, on PCN  #Pre-eclampsia w SF: Ruled in based on BP  labs BID  cont Mag  #AMA  #Grand multip: Plan for TXA at delivery  Alain Sor, MD 2:11 PM

## 2024-03-25 NOTE — Progress Notes (Cosign Needed Addendum)
 Cindy Cruz is a 38 y.o. [id number] at [redacted]w[redacted]d presented for IOL for pre-eclampsia with severe features by blood pressure.   S: Cindy Cruz is in bed and very sleepy from the magnesium . She comfortable with her epidural. She is amenable to cervical exam and IUPC placement.   O:  BP (!) 144/84   Pulse 74   Temp 98.6 F (37 C) (Oral)   Resp 18   Ht 5' 5 (1.651 m)   Wt 83.9 kg   LMP 07/18/2023 (Approximate)   SpO2 99%   BMI 30.78 kg/m  EFM: 120 baseline/moderate variability/no accelerations, no decelerations  CVE: Dilation: 4 Effacement (%): 50, 60 Station: -2 Presentation: Vertex Exam by:: Dr. Janita   A&P: 38 y.o. [id number] [redacted]w[redacted]d IOL for pre-eclampsia with severe features by blood pressure.   #Cindy: Continuing in latent Cindy.  #Pain: Epidural #FWB: Category I FHT #GBS positive on penicillin   #Pre-eclampsia w SF: Ruled in based on BP - Last severe range BP: 8/6 @ 1650 - Last antihypertensive pathway: 8/6 with three doses of IV labetalol  - Continued on magenesium - Labs BID, wnl to date   #Grand multip: Plan for TXA at delivery  Cindy DELENA Janita, MD 9:18 PM

## 2024-03-25 NOTE — Progress Notes (Signed)
 Labor progress note:  At bedside -- FB out, pt comfortable w epidural. Discussed role of AROM in IOL, pt provided verbal consent. AROM performed w moderate amount of clear to bloody fluid. Will cont to uptitrate Pitocin  for regular ctx. Cat I tracing. BP normotensive.  Alain Sor, MD OB Fellow, Faculty Practice [facility], Center for [facility]

## 2024-03-25 NOTE — Progress Notes (Addendum)
 LABOR PROGRESS NOTE Cindy Cruz is a 38 y.o. female [phone] with IUP at [redacted]w[redacted]d (dated by LMP, Estimated Date of Delivery: 04/23/24) presenting for pre-eclampsia with severe features.    Pt rechecked at 0415 by RN, discussed remains at 1cm dilation.    FHT: baseline 130, mod variability, +accels, -decels; overall category I. Toco: not contracting/irritable   A/P:  Repeat po miso, this is 3rd dose FB at next check Continue Mg GBS, adequately tx Epidural PRN   Cindy Wolfert S Shailee Foots, DO 4:16 AM

## 2024-03-25 NOTE — Anesthesia Procedure Notes (Addendum)
 Epidural Patient location during procedure: OB Start time: 03/25/2024 2:14 PM End time: 03/25/2024 2:24 PM  Staffing Anesthesiologist: Merla Almarie HERO, DO Performed: anesthesiologist   Preanesthetic Checklist Completed: patient identified, IV checked, risks and benefits discussed, monitors and equipment checked, pre-op evaluation and timeout performed  Epidural Patient position: sitting Prep: DuraPrep and site prepped and draped Patient monitoring: continuous pulse ox, blood pressure, heart rate and cardiac monitor Approach: midline Location: L3-L4 Injection technique: LOR air  Needle:  Needle type: Tuohy  Needle gauge: 17 G Needle length: 9 cm Needle insertion depth: 7 cm Catheter type: closed end flexible Catheter size: 19 Gauge Catheter at skin depth: 12 cm Test dose: negative  Assessment Sensory level: T8 Events: blood not aspirated, no cerebrospinal fluid, injection not painful, no injection resistance, no paresthesia and negative IV test  Additional Notes Patient identified. Risks/Benefits/Options discussed with patient including but not limited to bleeding, infection, nerve damage, paralysis, failed block, incomplete pain control, headache, blood pressure changes, nausea, vomiting, reactions to medication both or allergic, itching and postpartum back pain. Confirmed with bedside nurse the patient's most recent platelet count. Confirmed with patient that they are not currently taking any anticoagulation, have any bleeding history or any family history of bleeding disorders. Patient expressed understanding and wished to proceed. All questions were answered. Sterile technique was used throughout the entire procedure. Please see nursing notes for vital signs. Test dose was given through epidural catheter and negative prior to continuing to dose epidural or start infusion. Warning signs of high block given to the patient including shortness of breath, tingling/numbness in  hands, complete motor block, or any concerning symptoms with instructions to call for help. Patient was given instructions on fall risk and not to get out of bed. All questions and concerns addressed with instructions to call with any issues or inadequate analgesia.    DPE with 24G pencan through tuohy for confirmation of loss, clear CSF, no issues.Reason for block:procedure for pain

## 2024-03-26 ENCOUNTER — Inpatient Hospital Stay (HOSPITAL_COMMUNITY): Payer: MEDICAID | Admitting: Anesthesiology

## 2024-03-26 ENCOUNTER — Encounter (HOSPITAL_COMMUNITY)
Admission: AD | Disposition: A | Discharge status: Home / Self Care | Payer: Self-pay | Source: Home / Self Care | Attending: Obstetrics and Gynecology

## 2024-03-26 ENCOUNTER — Inpatient Hospital Stay (HOSPITAL_COMMUNITY): Payer: MEDICAID

## 2024-03-26 ENCOUNTER — Other Ambulatory Visit: Payer: Self-pay

## 2024-03-26 ENCOUNTER — Encounter (HOSPITAL_COMMUNITY): Payer: Self-pay | Admitting: Obstetrics and Gynecology

## 2024-03-26 DIAGNOSIS — N939 Abnormal uterine and vaginal bleeding, unspecified: Secondary | ICD-10-CM | POA: Diagnosis not present

## 2024-03-26 DIAGNOSIS — F17210 Nicotine dependence, cigarettes, uncomplicated: Secondary | ICD-10-CM | POA: Diagnosis not present

## 2024-03-26 DIAGNOSIS — I10 Essential (primary) hypertension: Secondary | ICD-10-CM

## 2024-03-26 DIAGNOSIS — Z3A35 35 weeks gestation of pregnancy: Secondary | ICD-10-CM

## 2024-03-26 DIAGNOSIS — R569 Unspecified convulsions: Secondary | ICD-10-CM

## 2024-03-26 DIAGNOSIS — O1414 Severe pre-eclampsia complicating childbirth: Secondary | ICD-10-CM | POA: Diagnosis not present

## 2024-03-26 DIAGNOSIS — J9611 Chronic respiratory failure with hypoxia: Secondary | ICD-10-CM

## 2024-03-26 DIAGNOSIS — O99820 Streptococcus B carrier state complicating pregnancy: Secondary | ICD-10-CM | POA: Diagnosis not present

## 2024-03-26 DIAGNOSIS — O09523 Supervision of elderly multigravida, third trimester: Secondary | ICD-10-CM | POA: Diagnosis not present

## 2024-03-26 DIAGNOSIS — O1493 Unspecified pre-eclampsia, third trimester: Secondary | ICD-10-CM

## 2024-03-26 DIAGNOSIS — J9601 Acute respiratory failure with hypoxia: Secondary | ICD-10-CM

## 2024-03-26 HISTORY — PX: IR US GUIDE VASC ACCESS RIGHT: IMG2390

## 2024-03-26 HISTORY — PX: IR EMBO ART  VEN HEMORR LYMPH EXTRAV  INC GUIDE ROADMAPPING: IMG5450

## 2024-03-26 HISTORY — PX: RADIOLOGY WITH ANESTHESIA: SHX6223

## 2024-03-26 HISTORY — PX: IR ANGIOGRAM PELVIS SELECTIVE OR SUPRASELECTIVE: IMG661

## 2024-03-26 LAB — CBC
HCT: 14.8 % — ABNORMAL LOW (ref 36.0–46.0)
HCT: 23.9 % — ABNORMAL LOW (ref 36.0–46.0)
Hemoglobin: 5 g/dL — CL (ref 12.0–15.0)
Hemoglobin: 8.2 g/dL — ABNORMAL LOW (ref 12.0–15.0)
MCH: 29.8 pg (ref 26.0–34.0)
MCH: 29.7 pg (ref 26.0–34.0)
MCHC: 33.8 g/dL (ref 30.0–36.0)
MCHC: 34.3 g/dL (ref 30.0–36.0)
MCV: 88.1 fL (ref 80.0–100.0)
MCV: 86.6 fL (ref 80.0–100.0)
Platelets: 72 K/uL — ABNORMAL LOW (ref 150–400)
Platelets: 110 K/uL — ABNORMAL LOW (ref 150–400)
RBC: 1.68 MIL/uL — ABNORMAL LOW (ref 3.87–5.11)
RBC: 2.76 MIL/uL — ABNORMAL LOW (ref 3.87–5.11)
RDW: 15.9 % — ABNORMAL HIGH (ref 11.5–15.5)
RDW: 14.9 % (ref 11.5–15.5)
WBC: 11.5 K/uL — ABNORMAL HIGH (ref 4.0–10.5)
WBC: 13.5 K/uL — ABNORMAL HIGH (ref 4.0–10.5)
nRBC: 0 % (ref 0.0–0.2)
nRBC: 0 % (ref 0.0–0.2)

## 2024-03-26 LAB — COMPREHENSIVE METABOLIC PANEL WITH GFR
ALT: 11 U/L (ref 0–44)
ALT: 14 U/L (ref 0–44)
ALT: 12 U/L (ref 0–44)
AST: 18 U/L (ref 15–41)
AST: 18 U/L (ref 15–41)
AST: 22 U/L (ref 15–41)
Albumin: <1.5 g/dL — ABNORMAL LOW (ref 3.5–5.0)
Albumin: 2.3 g/dL — ABNORMAL LOW (ref 3.5–5.0)
Albumin: 2.1 g/dL — ABNORMAL LOW (ref 3.5–5.0)
Alkaline Phosphatase: 28 U/L — ABNORMAL LOW (ref 38–126)
Alkaline Phosphatase: 39 U/L (ref 38–126)
Alkaline Phosphatase: 43 U/L (ref 38–126)
Anion gap: 10 (ref 5–15)
Anion gap: 10 (ref 5–15)
Anion gap: 9 (ref 5–15)
BUN: <5 mg/dL — ABNORMAL LOW (ref 6–20)
BUN: <5 mg/dL — ABNORMAL LOW (ref 6–20)
BUN: <5 mg/dL — ABNORMAL LOW (ref 6–20)
CO2: 16 mmol/L — ABNORMAL LOW (ref 22–32)
CO2: 18 mmol/L — ABNORMAL LOW (ref 22–32)
CO2: 22 mmol/L (ref 22–32)
Calcium: 5.5 mg/dL — CL (ref 8.9–10.3)
Calcium: 7.6 mg/dL — ABNORMAL LOW (ref 8.9–10.3)
Calcium: 7.6 mg/dL — ABNORMAL LOW (ref 8.9–10.3)
Chloride: 109 mmol/L (ref 98–111)
Chloride: 107 mmol/L (ref 98–111)
Chloride: 105 mmol/L (ref 98–111)
Creatinine, Ser: 0.81 mg/dL (ref 0.44–1.00)
Creatinine, Ser: 0.63 mg/dL (ref 0.44–1.00)
Creatinine, Ser: 0.88 mg/dL (ref 0.44–1.00)
GFR, Estimated: >60 mL/min (ref >60)
GFR, Estimated: >60 mL/min (ref >60)
GFR, Estimated: >60 mL/min (ref >60)
Glucose, Bld: 215 mg/dL — ABNORMAL HIGH (ref 70–99)
Glucose, Bld: 115 mg/dL — ABNORMAL HIGH (ref 70–99)
Glucose, Bld: 93 mg/dL (ref 70–99)
Potassium: 3.9 mmol/L (ref 3.5–5.1)
Potassium: 3.7 mmol/L (ref 3.5–5.1)
Potassium: 3.7 mmol/L (ref 3.5–5.1)
Sodium: 135 mmol/L (ref 135–145)
Sodium: 135 mmol/L (ref 135–145)
Sodium: 136 mmol/L (ref 135–145)
Total Bilirubin: 0.6 mg/dL (ref 0.0–1.2)
Total Bilirubin: 0.7 mg/dL (ref 0.0–1.2)
Total Bilirubin: 0.4 mg/dL (ref 0.0–1.2)
Total Protein: <3 g/dL — ABNORMAL LOW (ref 6.5–8.1)
Total Protein: 4.1 g/dL — ABNORMAL LOW (ref 6.5–8.1)
Total Protein: 4 g/dL — ABNORMAL LOW (ref 6.5–8.1)

## 2024-03-26 LAB — GLOBAL TEG PANEL
CFF Max Amplitude: 15.8 mm (ref 15–32)
CK with Heparinase (R): 5.2 min (ref 4.3–8.3)
Citrated Functional Fibrinogen: 288.3 mg/dL (ref 278–581)
Citrated Kaolin (K): 2.1 min (ref 0.8–2.1)
Citrated Kaolin (MA): 50.9 mm — ABNORMAL LOW (ref 52–69)
Citrated Kaolin (R): 5.6 min (ref 4.6–9.1)
Citrated Kaolin Angle: 69.3 deg (ref 63–78)
Citrated Rapid TEG (MA): 53 mm (ref 52–70)

## 2024-03-26 LAB — PREPARE RBC (CROSSMATCH)

## 2024-03-26 LAB — POCT I-STAT 7, (LYTES, BLD GAS, ICA,H+H)
Acid-base deficit: 4 mmol/L — ABNORMAL HIGH (ref 0.0–2.0)
Bicarbonate: 21.3 mmol/L (ref 20.0–28.0)
Calcium, Ion: 1.12 mmol/L — ABNORMAL LOW (ref 1.15–1.40)
HCT: 24 % — ABNORMAL LOW (ref 36.0–46.0)
Hemoglobin: 8.2 g/dL — ABNORMAL LOW (ref 12.0–15.0)
O2 Saturation: 90 %
Patient temperature: 35.6
Potassium: 3.7 mmol/L (ref 3.5–5.1)
Sodium: 136 mmol/L (ref 135–145)
TCO2: 23 mmol/L (ref 22–32)
pCO2 arterial: 38.7 mmHg (ref 32–48)
pH, Arterial: 7.342 — ABNORMAL LOW (ref 7.35–7.45)
pO2, Arterial: 58 mmHg — ABNORMAL LOW (ref 83–108)

## 2024-03-26 LAB — HEMOGLOBIN AND HEMATOCRIT, BLOOD
HCT: 20.2 % — ABNORMAL LOW (ref 36.0–46.0)
HCT: 23.7 % — ABNORMAL LOW (ref 36.0–46.0)
Hemoglobin: 6.3 g/dL — CL (ref 12.0–15.0)
Hemoglobin: 8.3 g/dL — ABNORMAL LOW (ref 12.0–15.0)

## 2024-03-26 LAB — DIC (DISSEMINATED INTRAVASCULAR COAGULATION)PANEL
D-Dimer, Quant: UNDETERMINED ug{FEU}/mL (ref 0.00–0.50)
D-Dimer, Quant: 12.39 ug{FEU}/mL — ABNORMAL HIGH (ref 0.00–0.50)
D-Dimer, Quant: 4.71 ug{FEU}/mL — ABNORMAL HIGH (ref 0.00–0.50)
D-Dimer, Quant: 6.29 ug{FEU}/mL — ABNORMAL HIGH (ref 0.00–0.50)
Fibrinogen: UNDETERMINED mg/dL (ref 210–475)
Fibrinogen: 222 mg/dL (ref 210–475)
Fibrinogen: 177 mg/dL — ABNORMAL LOW (ref 210–475)
Fibrinogen: 260 mg/dL (ref 210–475)
INR: UNDETERMINED (ref 0.8–1.2)
INR: 1.3 — ABNORMAL HIGH (ref 0.8–1.2)
INR: 1.5 — ABNORMAL HIGH (ref 0.8–1.2)
INR: 1.3 — ABNORMAL HIGH (ref 0.8–1.2)
Platelets: 124 K/uL — ABNORMAL LOW (ref 150–400)
Platelets: 69 K/uL — ABNORMAL LOW (ref 150–400)
Platelets: 59 K/uL — ABNORMAL LOW (ref 150–400)
Prothrombin Time: UNDETERMINED s (ref 11.4–15.2)
Prothrombin Time: 17.3 s — ABNORMAL HIGH (ref 11.4–15.2)
Prothrombin Time: 18.5 s — ABNORMAL HIGH (ref 11.4–15.2)
Prothrombin Time: 16.4 s — ABNORMAL HIGH (ref 11.4–15.2)
Smear Review: NONE SEEN
Smear Review: NONE SEEN
Smear Review: NONE SEEN
aPTT: UNDETERMINED s (ref 24–36)
aPTT: 31 s (ref 24–36)
aPTT: 33 s (ref 24–36)
aPTT: 30 s (ref 24–36)

## 2024-03-26 LAB — FIBRINOGEN: Fibrinogen: 295 mg/dL (ref 210–475)

## 2024-03-26 LAB — CERVICOVAGINAL ANCILLARY ONLY
Chlamydia: NEGATIVE
Comment: NEGATIVE
Comment: NEGATIVE
Comment: NORMAL
Neisseria Gonorrhea: NEGATIVE
Trichomonas: NEGATIVE

## 2024-03-26 LAB — GLUCOSE, CAPILLARY: Glucose-Capillary: 124 mg/dL — ABNORMAL HIGH (ref 70–99)

## 2024-03-26 LAB — MASSIVE TRANSFUSION PROTOCOL ORDER (BLOOD BANK NOTIFICATION)

## 2024-03-26 SURGERY — RADIOLOGY WITH ANESTHESIA
Anesthesia: General

## 2024-03-26 MED ORDER — CARBOPROST TROMETHAMINE 250 MCG/ML IM SOLN
INTRAMUSCULAR | Status: AC
  Administered 2024-03-26: 250 ug via INTRAMUSCULAR
  Filled 2024-03-26: qty 1

## 2024-03-26 MED ORDER — PROPOFOL 10 MG/ML IV BOLUS
INTRAVENOUS | Status: DC | PRN
  Administered 2024-03-26: 100 mg via INTRAVENOUS

## 2024-03-26 MED ORDER — LIDOCAINE HCL 1 % IJ SOLN
INTRAMUSCULAR | Status: AC
  Filled 2024-03-26: qty 20

## 2024-03-26 MED ORDER — EPINEPHRINE 1 MG/10ML IJ SOSY
PREFILLED_SYRINGE | INTRAMUSCULAR | Status: DC | PRN
Start: 2024-03-26 — End: 2024-03-26
  Administered 2024-03-26: 10 ug via INTRAVENOUS
  Administered 2024-03-26: 30 ug via INTRAVENOUS
  Administered 2024-03-26: 10 ug via INTRAVENOUS
  Administered 2024-03-26: 30 ug via INTRAVENOUS

## 2024-03-26 MED ORDER — FENTANYL CITRATE PF 50 MCG/ML IJ SOSY
50.0000 ug | PREFILLED_SYRINGE | INTRAMUSCULAR | Status: AC | PRN
  Administered 2024-03-26 (×3): 50 ug via INTRAVENOUS
  Filled 2024-03-26 (×3): qty 1

## 2024-03-26 MED ORDER — SUCCINYLCHOLINE 20MG/ML (10ML) SYRINGE FOR MEDFUSION PUMP - OPTIME
INTRAMUSCULAR | Status: DC | PRN
  Administered 2024-03-26: 140 mg via INTRAVENOUS

## 2024-03-26 MED ORDER — FENTANYL CITRATE PF 50 MCG/ML IJ SOSY: 50.0000 ug | PREFILLED_SYRINGE | INTRAMUSCULAR | Status: DC | PRN

## 2024-03-26 MED ORDER — CARBOPROST TROMETHAMINE 250 MCG/ML IM SOLN: 250.0000 ug | Freq: Once | INTRAMUSCULAR | Status: AC

## 2024-03-26 MED ORDER — LIDOCAINE HCL 1 % IJ SOLN
20.0000 mL | Freq: Once | INTRAMUSCULAR | Status: AC
  Administered 2024-03-26: 10 mL
  Filled 2024-03-26: qty 20

## 2024-03-26 MED ORDER — IBUPROFEN 600 MG PO TABS
600.0000 mg | ORAL_TABLET | Freq: Four times a day (QID) | ORAL | Status: DC | PRN
  Administered 2024-03-27 (×2): 600 mg
  Filled 2024-03-26 (×2): qty 3

## 2024-03-26 MED ORDER — ALBUMIN HUMAN 5 % IV SOLN
INTRAVENOUS | Status: DC | PRN
Start: 2024-03-26 — End: 2024-03-26

## 2024-03-26 MED ORDER — TRANEXAMIC ACID-NACL 1000-0.7 MG/100ML-% IV SOLN
INTRAVENOUS | Status: AC
Start: 2024-03-26 — End: 2024-03-26
  Administered 2024-03-26: 1000 mg via INTRAVENOUS
  Filled 2024-03-26: qty 100

## 2024-03-26 MED ORDER — PROPOFOL 1000 MG/100ML IV EMUL
INTRAVENOUS | Status: AC
  Filled 2024-03-26: qty 100

## 2024-03-26 MED ORDER — IOHEXOL 300 MG/ML  SOLN
100.0000 mL | Freq: Once | INTRAMUSCULAR | Status: AC | PRN
  Administered 2024-03-26: 10 mL via INTRA_ARTERIAL

## 2024-03-26 MED ORDER — METOCLOPRAMIDE HCL 5 MG/ML IJ SOLN
10.0000 mg | Freq: Four times a day (QID) | INTRAMUSCULAR | Status: DC
  Administered 2024-03-26 – 2024-03-27 (×3): 10 mg via INTRAVENOUS
  Filled 2024-03-26 (×3): qty 2

## 2024-03-26 MED ORDER — MAGNESIUM SULFATE 40 GM/1000ML IV SOLN
INTRAVENOUS | Status: DC | PRN
Start: 2024-03-26 — End: 2024-03-26
  Administered 2024-03-26: 2 g/h via INTRAVENOUS

## 2024-03-26 MED ORDER — DIPHENOXYLATE-ATROPINE 2.5-0.025 MG PO TABS
2.0000 | ORAL_TABLET | Freq: Once | ORAL | Status: AC
  Administered 2024-03-26: 2 via ORAL
  Filled 2024-03-26: qty 2

## 2024-03-26 MED ORDER — PHENYLEPHRINE HCL-NACL 20-0.9 MG/250ML-% IV SOLN
INTRAVENOUS | Status: DC | PRN
  Administered 2024-03-26: 50 ug/min via INTRAVENOUS

## 2024-03-26 MED ORDER — HYDROMORPHONE HCL 1 MG/ML IJ SOLN
1.0000 mg | Freq: Once | INTRAMUSCULAR | Status: AC
  Administered 2024-03-26: 1 mg via INTRAVENOUS
  Filled 2024-03-26: qty 2

## 2024-03-26 MED ORDER — SODIUM CHLORIDE 0.9 % IV SOLN
3.0000 g | Freq: Once | INTRAVENOUS | Status: AC
  Administered 2024-03-26: 3 g via INTRAVENOUS
  Filled 2024-03-26: qty 30

## 2024-03-26 MED ORDER — ROCURONIUM BROMIDE 10 MG/ML (PF) SYRINGE
PREFILLED_SYRINGE | INTRAVENOUS | Status: DC | PRN
  Administered 2024-03-26: 80 mg via INTRAVENOUS

## 2024-03-26 MED ORDER — VASOPRESSIN 20 UNIT/ML IV SOLN
INTRAVENOUS | Status: DC | PRN
  Administered 2024-03-26 (×5): 2 [IU] via INTRAVENOUS

## 2024-03-26 MED ORDER — SODIUM CHLORIDE 0.9 % IV SOLN
INTRAVENOUS | Status: DC | PRN
Start: 2024-03-26 — End: 2024-03-26

## 2024-03-26 MED ORDER — PROPOFOL 1000 MG/100ML IV EMUL
0.0000 ug/kg/min | INTRAVENOUS | Status: DC
  Administered 2024-03-26 (×2): 70 ug/kg/min via INTRAVENOUS
  Filled 2024-03-26: qty 200
  Filled 2024-03-26: qty 100

## 2024-03-26 MED ORDER — TRANEXAMIC ACID-NACL 1000-0.7 MG/100ML-% IV SOLN: 1000.0000 mg | INTRAVENOUS | Status: AC

## 2024-03-26 MED ORDER — LACTATED RINGERS AMNIOINFUSION
INTRAVENOUS | Status: DC
  Filled 2024-03-26 (×5): qty 1000

## 2024-03-26 MED ORDER — LOPERAMIDE HCL 2 MG PO CAPS: 4.0000 mg | ORAL_CAPSULE | Freq: Once | ORAL | Status: DC

## 2024-03-26 MED ORDER — HYDROMORPHONE HCL 1 MG/ML IJ SOLN
1.0000 mg | INTRAMUSCULAR | Status: DC | PRN
  Administered 2024-03-26: 2 mg via INTRAVENOUS
  Administered 2024-03-27 (×4): 1 mg via INTRAVENOUS
  Administered 2024-03-27: 2 mg via INTRAVENOUS
  Administered 2024-03-27 – 2024-03-28 (×3): 1 mg via INTRAVENOUS
  Filled 2024-03-26 (×7): qty 1
  Filled 2024-03-26: qty 2
  Filled 2024-03-26 (×3): qty 1

## 2024-03-26 MED ORDER — ALBUMIN HUMAN 5 % IV SOLN
12.5000 g | Freq: Once | INTRAVENOUS | Status: AC
  Administered 2024-03-26: 12.5 g via INTRAVENOUS

## 2024-03-26 MED ORDER — SODIUM CHLORIDE 0.9% IV SOLUTION: Freq: Once | INTRAVENOUS | Status: DC

## 2024-03-26 MED ORDER — LACTATED RINGERS IV SOLN: INTRAVENOUS | Status: DC | PRN

## 2024-03-26 MED ORDER — IOHEXOL 300 MG/ML  SOLN
150.0000 mL | Freq: Once | INTRAMUSCULAR | Status: AC | PRN
  Administered 2024-03-26: 70 mL via INTRA_ARTERIAL

## 2024-03-26 MED ORDER — NOREPINEPHRINE 4 MG/250ML-% IV SOLN
INTRAVENOUS | Status: DC | PRN
Start: 2024-03-26 — End: 2024-03-26
  Administered 2024-03-26: 3 ug/min via INTRAVENOUS

## 2024-03-26 MED ORDER — MIDAZOLAM HCL 2 MG/2ML IJ SOLN
INTRAMUSCULAR | Status: AC
  Filled 2024-03-26: qty 2

## 2024-03-26 MED ORDER — MISOPROSTOL 200 MCG PO TABS
ORAL_TABLET | ORAL | Status: AC
  Administered 2024-03-26: 200 ug via BUCCAL
  Filled 2024-03-26: qty 1

## 2024-03-26 MED ORDER — CHLORHEXIDINE GLUCONATE CLOTH 2 % EX PADS
6.0000 | MEDICATED_PAD | Freq: Every day | CUTANEOUS | Status: DC
  Administered 2024-03-26 – 2024-03-27 (×2): 6 via TOPICAL

## 2024-03-26 MED ORDER — DOCUSATE SODIUM 50 MG/5ML PO LIQD
100.0000 mg | Freq: Two times a day (BID) | ORAL | Status: DC
  Filled 2024-03-26: qty 10

## 2024-03-26 MED ORDER — LORAZEPAM 2 MG/ML IJ SOLN
INTRAMUSCULAR | Status: AC
  Filled 2024-03-26: qty 1

## 2024-03-26 MED ORDER — POLYETHYLENE GLYCOL 3350 17 G PO PACK
17.0000 g | PACK | Freq: Every day | ORAL | Status: DC
  Filled 2024-03-26: qty 1

## 2024-03-26 MED ORDER — MIDAZOLAM HCL 2 MG/2ML IJ SOLN
INTRAMUSCULAR | Status: DC | PRN
  Administered 2024-03-26: 2 mg via INTRAVENOUS

## 2024-03-26 MED ORDER — MISOPROSTOL 200 MCG PO TABS
800.0000 ug | ORAL_TABLET | Freq: Once | ORAL | Status: AC
  Administered 2024-03-26: 800 ug via RECTAL

## 2024-03-26 MED ORDER — CALCIUM CHLORIDE 10 % IV SOLN
INTRAVENOUS | Status: DC | PRN
  Administered 2024-03-26 (×2): 1 g via INTRAVENOUS

## 2024-03-26 MED ORDER — MISOPROSTOL 200 MCG PO TABS
ORAL_TABLET | ORAL | Status: AC
  Filled 2024-03-26: qty 4

## 2024-03-26 NOTE — Plan of Care (Signed)
  Problem: Education: Goal: Knowledge of disease or condition will improve Outcome: Progressing Goal: Knowledge of the prescribed therapeutic regimen will improve Outcome: Progressing   Problem: Fluid Volume: Goal: Peripheral tissue perfusion will improve Outcome: Progressing   Problem: Clinical Measurements: Goal: Complications related to disease process, condition or treatment will be avoided or minimized Outcome: Progressing   Problem: Education: Goal: Knowledge of General Education information will improve Description: Including pain rating scale, medication(s)/side effects and non-pharmacologic comfort measures Outcome: Progressing   Problem: Health Behavior/Discharge Planning: Goal: Ability to manage health-related needs will improve Outcome: Progressing   Problem: Clinical Measurements: Goal: Ability to maintain clinical measurements within normal limits will improve Outcome: Progressing Goal: Will remain free from infection Outcome: Progressing Goal: Diagnostic test results will improve Outcome: Progressing Goal: Respiratory complications will improve Outcome: Progressing Goal: Cardiovascular complication will be avoided Outcome: Progressing   Problem: Activity: Goal: Risk for activity intolerance will decrease Outcome: Progressing   Problem: Nutrition: Goal: Adequate nutrition will be maintained Outcome: Progressing   Problem: Coping: Goal: Level of anxiety will decrease Outcome: Progressing   Problem: Elimination: Goal: Will not experience complications related to bowel motility Outcome: Progressing Goal: Will not experience complications related to urinary retention Outcome: Progressing   Problem: Pain Managment: Goal: General experience of comfort will improve and/or be controlled Outcome: Progressing   Problem: Safety: Goal: Ability to remain free from injury will improve Outcome: Progressing   Problem: Skin Integrity: Goal: Risk for impaired  skin integrity will decrease Outcome: Progressing   Problem: Education: Goal: Knowledge of Childbirth will improve Outcome: Progressing Goal: Ability to make informed decisions regarding treatment and plan of care will improve Outcome: Progressing Goal: Ability to state and carry out methods to decrease the pain will improve Outcome: Progressing Goal: Individualized Educational Video(s) Outcome: Progressing   Problem: Coping: Goal: Ability to verbalize concerns and feelings about labor and delivery will improve Outcome: Progressing   Problem: Role Relationship: Goal: Will demonstrate positive interactions with the child Outcome: Progressing   Problem: Safety: Goal: Risk of complications during labor and delivery will decrease Outcome: Progressing   Problem: Pain Management: Goal: Relief or control of pain from uterine contractions will improve Outcome: Progressing   Problem: Activity: Goal: Ability to tolerate increased activity will improve Outcome: Progressing   Problem: Respiratory: Goal: Ability to maintain a clear airway and adequate ventilation will improve Outcome: Progressing   Problem: Role Relationship: Goal: Method of communication will improve Outcome: Progressing

## 2024-03-26 NOTE — Progress Notes (Addendum)
 Neo drip restarted at 0413 at 50mcg/min by DELENA Ang, CRNA

## 2024-03-26 NOTE — Lactation Note (Signed)
 This note was copied from a baby's chart. Lactation Consultation Note  Patient Name: Cindy Cruz Unijb'd Date: 03/26/2024 Age:37 hours   Spoke to ICU RN Connell W and she voiced that they just extubated [name]. She asked her if she would like to provide breastmilk for her baby and she said yes but she's not ready to start pumping yet or see lactation. She would like to be re-assessed tomorrow regarding her pumping status. LC services to F/U at a later time.   Terryon Pineiro GORMAN Crate 03/26/2024, 5:38 PM

## 2024-03-26 NOTE — Progress Notes (Addendum)
 At this time patient has not passed anymore blood clots other than the initial large one. Patient states her pain is well controlled by Kpad and PRN dilaudid .

## 2024-03-26 NOTE — Progress Notes (Addendum)
 Neo started at 50mcg/min by DELENA Ang, CRNA

## 2024-03-26 NOTE — Progress Notes (Signed)
 O.B. MD made aware of pink tinged urine and patient complaining of contraction pain. MD states she noted the pink tinged urine and will monitor. PRN pain meds ordered.

## 2024-03-26 NOTE — Progress Notes (Signed)
 Post Partum Day 0 Subjective: Patient remains intubated and sedated  Objective: Blood pressure 104/72, pulse 80, temperature 97.8 F (36.6 C), temperature source Esophageal, resp. rate 18, height 5' 5 (1.651 m), weight 90 kg, last menstrual period 07/18/2023, SpO2 100%, unknown if currently breastfeeding.  Intake/Output Summary (Last 24 hours) at 03/26/2024 1521 Last data filed at 03/26/2024 1330 Gross per 24 hour  Intake 12145.52 ml  Output 7728 ml  Net 4417.52 ml    Blood noted in Jada tubing and wall canister remains empty  Physical Exam:  GENERAL: Well-developed, well-nourished female intubated and in no acute distress.  ABDOMEN: Soft, nondistended. Palpable uterine fundus below the umbilicus PELVIC: Normal external female genitalia. Negative pressure of Jada turned off without additional vaginal bleeding. Balloon deflated and Jada device removed. Small blood clot adherent to [place] device. No active vaginal bleeding noted. Chaperone present during the pelvic exam EXTREMITIES: No cyanosis, clubbing, or edema, 2+ distal pulses.   Recent Labs    03/26/24 0859 03/26/24 1314  HGB 8.2* 8.3*  HCT 24.0* 23.7*     Assessment/Plan: 38 yo G9P8 PPD#0 s/p spontaneous vaginal delivery complicated by significant postpartum hemorrhage and eclampsia  1) Eclampsia - Continue magnesium  sulfate for seizure prophylaxis for a total of 24 hours post delivery - Follow up EEG recently performed - Continue monitoring BP and labs which thus far have remained stable  2) Postpartum hemorrhage - Massive transfusion protocol was initiated and patient received 12 units pRBC, 8 units FFP, 4 units platelets, 2 units of cryo and 2 units albumin  - Patient is s/p uterine artery embolization - Jada removed - Hg remains stable - Continue monitoring labs  3) Pulmonary - No active vaginal bleeding noted even following fundal massage - Continue electrolyte replacement  - Extubation per critical care team-  patient stable from obstetrical standpoint  Continue current care and transfer to [facility] specialty care when stable from critical care team standpoint      LOS: 2 days   Winton Felt, MD 03/26/2024, 3:13 PM

## 2024-03-26 NOTE — Procedures (Signed)
 Patient Name: Cindy Cruz  MRN: [id number]  Epilepsy Attending: Arlin MALVA Krebs  Referring Physician/Provider: Theodoro Lakes, MD  Date:  03/26/2024 Duration: 22.43 mins  Patient history: 38yo F with eclampsia and seizure. EEG to evaluate for seizure  Level of alertness:  comatose  AEDs during EEG study: Propofol   Technical aspects: This EEG study was done with scalp electrodes positioned according to the 10-20 International system of electrode placement. Electrical activity was reviewed with band pass filter of 1-70Hz , sensitivity of 7 uV/mm, display speed of 16mm/sec with a 60Hz  notched filter applied as appropriate. EEG data were recorded continuously and digitally stored.  Video monitoring was available and reviewed as appropriate.  Description: EEG showed continuous generalized polymorphic sharply contoured 3 to 6 Hz theta-delta slowing admixed with 15 to 18 Hz beta activity distributed symmetrically and diffusely.  Hyperventilation and photic stimulation were not performed.     ABNORMALITY - Continuous slow, generalized  IMPRESSION: This study is suggestive of severe diffuse encephalopathy likely related to sedation. No seizures or epileptiform discharges were seen throughout the recording.  Loucile Posner O Lujain Kraszewski

## 2024-03-26 NOTE — Sedation Documentation (Signed)
 Uterine embolization with Embo-Cubes to the Left side Uterine artery

## 2024-03-26 NOTE — Progress Notes (Signed)
 Neo drip stopped at 0354 by DELENA Ang, CRNA

## 2024-03-26 NOTE — Anesthesia Procedure Notes (Signed)
 Arterial Line Insertion Start/End8/03/2024 4:45 AM, 03/26/2024 4:50 AM Performed by: Lansing Hildegard NOVAK, CRNA, CRNA  Patient location: Nursing unit. Preanesthetic checklist: patient identified, IV checked, site marked, risks and benefits discussed, surgical consent, monitors and equipment checked, pre-op evaluation, timeout performed and anesthesia consent Lidocaine  1% used for infiltration Right, radial was placed Catheter size: 20 G Hand hygiene performed  and maximum sterile barriers used   Attempts: 1 Procedure performed without using ultrasound guided technique. Following insertion, dressing applied. Post procedure assessment: normal and unchanged

## 2024-03-26 NOTE — Progress Notes (Signed)
 RN to room, checked Cindy Cruz, in place to wall suction. No leaking around [place]. No more blood in tubing, updated Pt's husband.

## 2024-03-26 NOTE — Progress Notes (Signed)
 Date and time results received: 03/26/24 0425 (use smartphrase .now to insert current time)  Test: HGB Critical Value: 6.3  Name of Provider Notified: Dr LITTIE Buddle  Orders Received? Or Actions Taken?: Actions Taken: plan for pt to go to IR in place already

## 2024-03-26 NOTE — TOC CM/SW Note (Signed)
 Transition of Care [facility]) - Inpatient Brief Assessment   Patient Details  Name: Cindy Cruz MRN: [id number] Date of Birth: [birth year]  Transition of Care [facility]) CM/SW Contact:    Tom-Johnson, Harvest Muskrat, RN Phone Number: 03/26/2024, 1:43 PM   Clinical Narrative:  Patient admitted to the Hospital with IOL for severe Pre-eclampsia by Blood Pressure.  [id number] s/p SVD at [redacted]w[redacted]d. Patient's baby was delivered spontaneously today 03/26/24. Bleeding noted, Code Hemorrhage was called. Patient's hgb was noted at 5, patient has received the following Blood products: PRBC x 10, FFP x 2, Cryoprecipitate x 1. Hgb today at 8.2. Patient underwent Uterine Artery Embolization today 03/26/24 in IR. Currently intubated and sedated.    No TOC needs or recommendations noted at this time.  Patient not Medically ready for discharge.  CM will continue to follow as patient progresses with care towards discharge.           Transition of Care Asessment: Insurance and Status: Insurance coverage has been reviewed Patient has primary care physician: No Home environment has been reviewed: Yes Prior level of function:: Independent Prior/Current Home Services: No current home services Social Drivers of Health Review: SDOH reviewed no interventions necessary Readmission risk has been reviewed: Yes Transition of care needs: no transition of care needs at this time

## 2024-03-26 NOTE — Progress Notes (Signed)
 EEG complete - results pending

## 2024-03-26 NOTE — Anesthesia Procedure Notes (Signed)
 Procedure Name: Intubation Date/Time: 03/26/2024 5:17 AM  Performed by: Lansing Hildegard NOVAK, CRNAPre-anesthesia Checklist: Patient identified, Emergency Drugs available, Suction available and Patient being monitored Patient Re-evaluated:Patient Re-evaluated prior to induction Oxygen Delivery Method: Circle System Utilized Preoxygenation: Pre-oxygenation with 100% oxygen Induction Type: IV induction, Rapid sequence and Cricoid Pressure applied Laryngoscope Size: Miller and 2 Grade View: Grade I Tube type: Oral Tube size: 7.0 mm Number of attempts: 1 Airway Equipment and Method: Stylet and Oral airway Placement Confirmation: ETT inserted through vocal cords under direct vision, positive ETCO2 and breath sounds checked- equal and bilateral Secured at: 21 cm Tube secured with: Tape Dental Injury: Teeth and Oropharynx as per pre-operative assessment  Comments: Intubated by Jon Barrios CRNA

## 2024-03-26 NOTE — Progress Notes (Signed)
 Neo drip increased to 75mcg/min by DELENA Ang, CRNA

## 2024-03-26 NOTE — Progress Notes (Addendum)
 EEG neg. Hb stable. Ok to extubate from [facility] standpoint. Patient successfully extubated today.

## 2024-03-26 NOTE — Sedation Documentation (Signed)
 Uterine embolization with Embo-Cubes to the Left side Uterine artery (2nd)

## 2024-03-26 NOTE — Consult Note (Signed)
 NAME:  Cindy Cruz, MRN:  [id number], DOB:  05/22/1986, LOS: 2 ADMISSION DATE:  03/24/2024, CONSULTATION DATE:  03/26/2024 REFERRING MD:  Devaughn Ban, MD, CHIEF COMPLAINT:  Hemorrhagic Shock  History of Present Illness:  38 y/o female who is [id number] presented for IOL fro Pre-eclampsia who has delivered healthy baby via vaginal birth.  Post partum immediately after delivery of placenta, brsik bleeding noted.  Per OB note,Pitocin  was given. Rectal misoprostol  800 mcg given. Bleeding continued. Attending, Dr. Zina, called to bedside. Bimanual massage by Clinical research associate, demonstrated boggy lower uterine segment. Dr. Zina placed Jada. Code hemorrhage was called. Foley catheter was placed to drain the bladder. Patient was alert to voice but with blood pressure readings in the 70s/40s. Jada was removed and Dr. Zina performed an additional uterine sweep. Labia, perineum, vagina, and cervix inspected and found to be intact. Buccal misoprostol  200 mcg was given. Dr. Zina replaced the Jada. Massive transfusion protocol was called. The patient received the following blood products: pRBC x 4, FFP x 2, Cryoprecipitate x 1.    There was minimal blood in the suction canister after the Jada was successfully replaced.   Approximately 20 min later the suction canister filled to 700cc. After replacement there was minimal flow of blood through the Jada system, however, there was bright red brisk bleeding around the Jada with any valsalva or abdominal pressure from the patient. Phenylephrine  was started for continued low blood pressures. Dr. Zina called IR for likely uterine artery embolization. Labs drawn at delivery are pending. Patient is alert and oriented.  EBL . Pertinent  Medical History  HTN Pre-eclampsia  Significant Hospital Events: Including procedures, antibiotic start and stop dates in addition to other pertinent events   8/8: transfer to ICU  Interim History / Subjective:  Post partum bleeding    Objective    Blood pressure 92/60, pulse 91, temperature (!) 97.4 F (36.3 C), temperature source Oral, resp. rate 16, height 5' 5 (1.651 m), weight 83.9 kg, last menstrual period 07/18/2023, SpO2 100%, unknown if currently breastfeeding.        Intake/Output Summary (Last 24 hours) at 03/26/2024 0459 Last data filed at 03/26/2024 0449 Gross per 24 hour  Intake 7503.13 ml  Output 7128 ml  Net 375.13 ml   Filed Weights   03/25/24 0809  Weight: 83.9 kg    Examination: General: awake and oriented HENT: pupils round and reactive Lungs: CTA no wheezes no rales Cardiovascular: reg s1s2 no murmurs  Abdomen: post c-section Extremities: mild edema Neuro: nonfocal   Resolved problem list   Assessment and Plan  Post partum bleeding Going to IR/OR Hemorrhagic shock IR for embolization OR if IR not able to embolize Monitor H/H Potential DIC DIC panel Pre-eclampsia Continue magnesium  BP management Acute hypoxic respiratory failure Not intubated at this time, may require intubation post op if she goes to OR HTN BP management  Best Practice (right click and Reselect all SmartList Selections daily)   Diet/type: NPO DVT prophylaxis SCD Pressure ulcer(s): N/A GI prophylaxis: N/A Lines: N/A Foley:  N/A Code Status:  full code   Labs   CBC: Recent Labs  Lab 03/24/24 1606 03/25/24 1020 03/25/24 1653 03/26/24 0246  WBC 5.4 7.5 6.4  --   HGB 11.3* 10.7* 10.6* 6.3*  HCT 34.9* 32.4* 32.1* 20.2*  MCV 87.3 85.9 86.3  --   PLT 275 257 241 124*    Basic Metabolic Panel: Recent Labs  Lab 03/24/24 1606 03/25/24 1653  NA 134* 135  K 3.2* 3.6  CL 104 104  CO2 21* 24  GLUCOSE 94 96  BUN <5* <5*  CREATININE 0.62 0.69  CALCIUM  9.0 7.2*   GFR: Estimated Creatinine Clearance: 104.1 mL/min (by C-G formula based on SCr of 0.69 mg/dL). Recent Labs  Lab 03/24/24 1606 03/25/24 1020 03/25/24 1653  WBC 5.4 7.5 6.4    Liver Function Tests: Recent Labs  Lab  03/24/24 1606 03/25/24 1653  AST 34 23  ALT 23 16  ALKPHOS 123 99  BILITOT 0.7 0.3  PROT 7.3 6.3*  ALBUMIN  2.9* 2.5*   No results for input(s): LIPASE, AMYLASE in the last 168 hours. No results for input(s): AMMONIA in the last 168 hours.  ABG No results found for: PHART, PCO2ART, PO2ART, HCO3, TCO2, ACIDBASEDEF, O2SAT   Coagulation Profile: Recent Labs  Lab 03/26/24 0246  INR PENDING    Cardiac Enzymes: No results for input(s): CKTOTAL, CKMB, CKMBINDEX, TROPONINI in the last 168 hours.  HbA1C: Hgb A1c MFr Bld  Date/Time Value Ref Range Status  11/04/2023 09:13 AM 5.3 4.8 - 5.6 % Final    Comment:             Prediabetes: 5.7 - 6.4          Diabetes: >6.4          Glycemic control for adults with diabetes: <7.0     CBG: No results for input(s): GLUCAP in the last 168 hours.  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   Surgical History:   Past Surgical History:  Procedure Laterality Date   WISDOM TOOTH EXTRACTION       Social History:   reports that she has been smoking cigarettes. She has a 2.5 pack-year smoking history. She has never used smokeless tobacco. She reports that she does not currently use alcohol. She reports that she does not use drugs.   Family History:  Her family history includes Cancer in her maternal aunt; Hypertension in her mother. There is no history of Hearing loss.   Allergies No Known Allergies   Home Medications  Prior to Admission medications   Not on File     Critical care time: 65   The patient is critically ill with multiple organ system failure and requires high complexity decision making for assessment and support, frequent evaluation and titration of therapies, advanced monitoring, review of radiographic studies and  interpretation of complex data.   Critical Care Time devoted to patient care services, exclusive of separately billable procedures, described in this note is 33 minutes.   Orlin Fairly, MD [facility] See Amion for pager  If no response to pager , please call [phone] until 7pm After 7:00 pm call Elink  [phone] 03/26/2024, 5:00 AM

## 2024-03-26 NOTE — Progress Notes (Addendum)
 Neo drip at 75mcg/min @ 0309 Neo drip at 65mcg/min @ 0340  A Lansing, CRNA

## 2024-03-26 NOTE — Progress Notes (Signed)
 Late note, summary note:  MD called to room for terminal bradycardia.  Upon my arrival a few minutes later the baby had already been delivered.  Once the placenta had been delivered, the patient began bleeding briskly.  Pitocin  was started and the patient received 800 mcg of rectal cytotec .  Pt continued bleeding and decision was made to place the [place] device.  I placed the jada device and the uterine tone was felt to be improved. Initial survey of EBL was around 2000 ml.  There was bleeding around the [facility] device and the massive transfusion protocol was ordered.  Pt started receiving 2 units of blood.  Dr. Ozan had been called from backup status and felt the patient was stabilizing.    I removed the jada device and cleared out the lower uterine segment.  The jada was replaced and the canister began to immediately fill.  The canister was completely full and had to be replaced. At this point it was felt that the patient was likely in DIC.  FFP was given in the room.  The patient had multiple blood draws for a DIC panel which the lab repeatedly was unable to process.  The patient Had already received 2 doses of TXA and now received a dose of hemabate .  As she continued bleeding and needed pressors in the room, I contacted interventional radiology for possible embolization as surgical management would likely worsen the bleeding.  Pt was sent to the IR suite where had a single seizure.  The patient was intubated and sedated and the magnesium  sulfate which had been previously stopped due to atony was restarted.  The embolization was started and was successful. During the procedure, labs were finally reviewed which supported DIC .  PT continued receiving blood, fluids, FFP and cryoprecipitate  for support.  The ICU had been previously contacted and she was taken there for continued support and management.  At the conclusion of this sequence, Ebl was at 3900-409ml .  At the time of this writing the patient received 10  units of PRBCs, 8 units of FFP, 2 units of cryoprecipitate , 2 units of albumin   and 2.5 L of fluid.    Pt is receiving platelets currently as the last check was 69.   Jerilynn Buddle, MD

## 2024-03-26 NOTE — Progress Notes (Addendum)
 Neo drip decreased to 40mcg/hr at 0345 Neo at 30mcg/min at 0349 Neo at 15mcg/min at 0351 Neo at 10mcg/min at 0352

## 2024-03-26 NOTE — Progress Notes (Signed)
 Called to ICU.Pt complains of pain everywhere. RN got orders from Dr alger. 1 large blood clot in bed. No more clots expressed. Discussed with Dr Fredirick.

## 2024-03-26 NOTE — Progress Notes (Signed)
 Patient ID: Cindy Cruz, female   DOB: August 22, 1985, 38 y.o.   MRN: [id number] FHR decel to 80s for 1-2 min AMNIOINFUSION ORDERED  Dilation: 4.5 Effacement (%): 80 Station: -2 Presentation: Vertex Exam by:: Kent Solian RN  POSITION CHANGES, ETC pPRN

## 2024-03-26 NOTE — Progress Notes (Signed)
 Date and time results received: 03/26/24 0638 (use smartphrase .now to insert current time)  Test: HGB Critical Value: 5.0  Test: Calcium  Critical Value: 505  Name of Provider Notified: Dr LITTIE Buddle  Orders Received? Or Actions Taken?: pt in IR at this time with MTP ongoing

## 2024-03-26 NOTE — Sedation Documentation (Signed)
 Pt remains intubated and sedated with care of anesthesia per MD orders.

## 2024-03-26 NOTE — Progress Notes (Signed)
 DELENA Ang, CRNA at bedside titrating Neo drip

## 2024-03-26 NOTE — Progress Notes (Signed)
   03/26/24 1200  Spiritual Encounters  Type of Visit Initial  Care provided to: Pt and family  Referral source Chaplain team  Reason for visit Routine spiritual support  OnCall Visit No  Interventions  Spiritual Care Interventions Made Established relationship of care and support;Compassionate presence;Reflective listening;Normalization of emotions;Encouragement  Intervention Outcomes  Outcomes Awareness of support;Connection to spiritual care   Chaplain received morning report to follow up with patient after having a difficult experience.  Patient currently asleep.  Family in the room and welcomed visit.  Chaplain provided words of encouragement and support.  Chaplain spiritual support services remain available as the need arises.

## 2024-03-26 NOTE — Progress Notes (Signed)
   03/26/24 0217  Spiritual Encounters  Type of Visit Initial;Attempt (pt unavailable)  Conversation partners present during encounter Nurse  Referral source Other (comment)  Reason for visit Code  OnCall Visit Yes   Responded to NICU/ MTP Hemorrhage. Patient being taken to OR per nurse. Father and child are doing well. Awaiting status.

## 2024-03-26 NOTE — Discharge Summary (Signed)
 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   History of gestational hypertension   Rubella non-immune status, antepartum   Advanced maternal age in multigravida   Spontaneous vaginal delivery   Postpartum hemorrhage   Pre-eclampsia in third trimester   Chronic respiratory failure with hypoxia (HCC)   Acute respiratory failure with hypoxia (HCC)   Uterine bleeding  Additional problems: None    Discharge diagnosis: Preterm Pregnancy Delivered, Preeclampsia (severe), and PPH                                              Post partum procedures: Jada placement, blood transfusion, uterine artery embolization Augmentation: AROM, Pitocin , Cytotec , and IP Foley Complications: Hemorrhage>1046mL  Hospital course: Induction of Labor With Vaginal Delivery   38 y.o. yo [id number] at [redacted]w[redacted]d was admitted to the hospital 03/24/2024 for induction of labor.  Indication for induction: Preeclampsia with severe features by blood pressure.  Patient had an labor course complicated by PPH estimated to be about 3-4L. She was treated with Pitocin , Hemabate , rectal misoprostol  800 mcg, buccal misoprostol  200 mcg, and Jada placement. She additionally required transfusion of multiple blood products: pRBCs x12, FFP x8, cryoprecipitate x2, 4 packs of platelets. She was given crystalloid and phenylephrine  for hypotension. IR was called for uterine artery embolization which proceeded without complication. After this, the remainder of her hospitalization was uncomplicated.  Membrane Rupture  Time/Date: 2:50 PM,03/25/2024  Delivery Method:Vaginal, Spontaneous Operative Delivery:N/A Episiotomy: None Lacerations:  None Details of delivery can be found in separate delivery note.  Patient had a postpartum course complicated by Preeclampsia with SF (this also complicated her antenatal course). Her blood pressure was well managed on Procardia  XL 60 mg, Lasix  and potassium. We discussed contraception - she desired Depo. She will either continue with this vs doing a LARC. We discussed both the Nexplanon and the Mirena. We also discussed risk of future pregnancies related to both her risk of PPH again and the risk of pregnancy after [place]. Ultimately, I advised against future pregnancy for her sake. She voiced understanding and will consider.  Patient is discharged home 03/29/24.  Newborn Data: Birth date:03/26/2024 Birth time:1:40 AM Gender:Female Living status:Living Apgars:3 ,7  Weight:2180 g  Magnesium  Sulfate received: Yes: Seizure prophylaxis BMZ received: No Rhophylac:No MMR:Yes T-DaP:Given prenatally Flu: N/A RSV Vaccine received: No Transfusion:Yes: pRBCs, FFP, and cryoprecipitate  Immunizations received: Immunization History  Administered Date(s) Administered   Influenza,inj,Quad PF,6+ Mos 06/24/2016, 05/27/2018   MMR 08/22/2016   Rabies, IM 07/02/2011   Tdap 06/24/2016, 05/27/2018, 07/12/2020, 02/18/2024    Physical exam  Vitals:   03/28/24 2042 03/29/24 0046 03/29/24 0544 03/29/24 0814  BP: 120/89 111/64 121/64 111/63  Pulse: 96 89 87 (!) 103  Resp: 17 16 16 16   Temp: 98 F (36.7 C) 98.3 F (36.8 C) 98.8 F (37.1 C) 98.9 F (37.2 C)  TempSrc: Oral Oral Oral Oral  SpO2: 97% 97% 98% 97%  Weight:      Height:  General: alert, cooperative, and no distress Lochia: appropriate Uterine Fundus: firm Incision: Dressing is clean, dry, and intact (From [place]) DVT Evaluation: No evidence of DVT seen on physical exam. Labs: Lab Results  Component Value Date    WBC 16.6 (H) 03/29/2024   HGB 9.3 (L) 03/29/2024   HCT 27.5 (L) 03/29/2024   MCV 86.5 03/29/2024   PLT 212 03/29/2024      Latest Ref Rng & Units 03/29/2024    4:20 AM  CMP  Glucose 70 - 99 mg/dL 879   BUN 6 - 20 mg/dL 5   Creatinine 9.55 - 8.99 mg/dL 9.31   Sodium 864 - 854 mmol/L 135   Potassium 3.5 - 5.1 mmol/L 3.7   Chloride 98 - 111 mmol/L 101   CO2 22 - 32 mmol/L 24   Calcium  8.9 - 10.3 mg/dL 9.4   Total Protein 6.5 - 8.1 g/dL 6.6   Total Bilirubin 0.0 - 1.2 mg/dL 0.3   Alkaline Phos 38 - 126 U/L 95   AST 15 - 41 U/L 22   ALT 0 - 44 U/L 13    Edinburgh Score:    03/28/2024    7:00 PM  Edinburgh Postnatal Depression Scale Screening Tool  I have been able to laugh and see the funny side of things. 0  I have looked forward with enjoyment to things. 0  I have blamed myself unnecessarily when things went wrong. 1  I have been anxious or worried for no good reason. 0  I have felt scared or panicky for no good reason. 1  Things have been getting on top of me. 1  I have been so unhappy that I have had difficulty sleeping. 2  I have felt sad or miserable. 1  I have been so unhappy that I have been crying. 1  The thought of harming myself has occurred to me. 0  Edinburgh Postnatal Depression Scale Total 7   Edinburgh Postnatal Depression Scale Total: 7   After visit meds:  Allergies as of 03/29/2024   No Known Allergies      Medication List     STOP taking these medications    fluconazole  150 MG tablet Commonly known as: DIFLUCAN        TAKE these medications    diphenhydrAMINE  25 mg capsule Commonly known as: BENADRYL  Take 1 capsule (25 mg total) by mouth every 6 (six) hours as needed for itching.   famotidine  20 MG tablet Commonly known as: PEPCID  Take 20 mg by mouth 2 (two) times daily.   furosemide  20 MG tablet Commonly known as: LASIX  Take 1 tablet (20 mg total) by mouth daily. Start taking on: March 30, 2024   ibuprofen  600 MG  tablet Commonly known as: ADVIL  Take 1 tablet (600 mg total) by mouth every 6 (six) hours as needed for mild pain (pain score 1-3).   NIFEdipine  60 MG 24 hr tablet Commonly known as: ADALAT  CC Take 1 tablet (60 mg total) by mouth 2 (two) times daily.   oxyCODONE  5 MG immediate release tablet Commonly known as: Oxy IR/ROXICODONE  Take 1-2 tablets (5-10 mg total) by mouth every 4 (four) hours as needed for moderate pain (pain score 4-6) or severe pain (pain score 7-10).   pantoprazole  40 MG tablet Commonly known as: PROTONIX  Take 40 mg by mouth daily.   potassium chloride  20 MEQ packet Commonly known as: KLOR-CON  Take 20 mEq by mouth 2 (two) times daily.   prenatal vitamin w/FE, FA 27-1 MG  Tabs tablet Take 1 tablet by mouth daily at 12 noon.         Discharge home in stable condition Infant Feeding: Bottle and Breast Infant Disposition:home with mother Discharge instruction: per After Visit Summary and Postpartum booklet. Activity: Advance as tolerated. Pelvic rest for 6 weeks.  Diet: routine diet Future Appointments: Future Appointments  Date Time Provider Department Center  04/01/2024 10:20 AM [facility] NURSE [facility] None  04/23/2024  9:15 AM Zina Jerilynn LABOR, MD [facility] None   Follow up Visit:  Follow-up Information     [facility] Follow up in 1 week(s).   Specialty: Obstetrics and Gynecology Contact information: [address], [address] [place]  [address] [phone]                Sent to [place] [place] on 03/26/24  Please schedule this patient for a In person postpartum visit in 4 weeks with the following provider: MD. Additional Postpartum F/U:BP check 1 week  High risk pregnancy complicated by: Preeclampsia with severe features superimposed on gHTN, PPH Delivery mode:  Vaginal, Spontaneous Anticipated Birth Control:  Depo bridge to outpt LARC   03/29/2024 Vina Solian, MD

## 2024-03-26 NOTE — Procedures (Signed)
 Interventional Radiology Procedure Note  Procedure: Uterine artery embolization bilateral  Complications: None  Estimated Blood Loss: < 10 mL  Findings: Active hemorrhage from the right uterine artery branch in post delivery patient.  The left uterine artery supplied branched along the fundus of the uterine wall to the hemorrhage as well.  Right coil embolization.  Left gelfoam embolization.  Cordella DELENA Banner, MD

## 2024-03-26 NOTE — Transfer of Care (Signed)
 Immediate Anesthesia Transfer of Care Note  Patient: Cindy Cruz  Procedure(s) Performed: RADIOLOGY WITH ANESTHESIA  Patient Location: ICU  Anesthesia Type:General  Level of Consciousness: sedated and Patient remains intubated per anesthesia plan  Airway & Oxygen Therapy: Patient remains intubated per anesthesia plan and Patient placed on Ventilator (see vital sign flow sheet for setting)  Post-op Assessment: Report given to RN and Post -op Vital signs reviewed and stable  Post vital signs: Reviewed and stable  Last Vitals:  Vitals Value Taken Time  BP    Temp    Pulse    Resp    SpO2      Last Pain:  Vitals:   03/26/24 0334  TempSrc: Oral  PainSc:          Complications: No notable events documented.

## 2024-03-26 NOTE — Progress Notes (Signed)
 RROB to see pt. Cindy Cruz had been removed> fundus U/E scant lochia. Pt responded to pain of fundal rub

## 2024-03-26 NOTE — Anesthesia Preprocedure Evaluation (Signed)
 Anesthesia Evaluation  Patient identified by MRN, date of birth, ID band Patient confused    Reviewed: Allergy & Precautions, NPO status , Patient's Chart, lab work & pertinent test results  Airway Mallampati: III  TM Distance: >3 FB Neck ROM: Full    Dental  (+) Teeth Intact, Dental Advisory Given   Pulmonary Current Smoker and Patient abstained from smoking.   Pulmonary exam normal breath sounds clear to auscultation       Cardiovascular hypertension (Pre-eclampsia with severe features),  Rhythm:Regular Rate:Tachycardia     Neuro/Psych  PSYCHIATRIC DISORDERS  Depression    negative neurological ROS     GI/Hepatic Neg liver ROS,GERD  ,,  Endo/Other  negative endocrine ROS  Obesity   Renal/GU negative Renal ROS     Musculoskeletal negative musculoskeletal ROS (+)    Abdominal   Peds  Hematology  (+) Blood dyscrasia, anemia   Anesthesia Other Findings Day of surgery medications reviewed with the patient.  Reproductive/Obstetrics S/p SVD with massive bleeding                              Anesthesia Physical Anesthesia Plan  ASA: 5 and emergent  Anesthesia Plan: General   Post-op Pain Management:    Induction: Intravenous, Rapid sequence and Cricoid pressure planned  PONV Risk Score and Plan: 2  Airway Management Planned: Oral ETT  Additional Equipment: Arterial line  Intra-op Plan:   Post-operative Plan: Post-operative intubation/ventilation  Informed Consent: I have reviewed the patients History and Physical, chart, labs and discussed the procedure including the risks, benefits and alternatives for the proposed anesthesia with the patient or authorized representative who has indicated his/her understanding and acceptance.     Only emergency history available  Plan Discussed with: CRNA  Anesthesia Plan Comments: (Pre-op evaluation completed after induction of anesthesia  due to emergent nature of procedure.)        Anesthesia Quick Evaluation

## 2024-03-26 NOTE — Consult Note (Signed)
 NAME:  Cindy Cruz, MRN:  [id number], DOB:  03/11/1986, LOS: 2 ADMISSION DATE:  03/24/2024, CONSULTATION DATE:  03/26/24 REFERRING MD: Kandis  CHIEF COMPLAINT:  03/26/24   History of Present Illness:  Pt is encephelopathic; therefore, this HPI is obtained from chart review. Cindy Cruz is a 38 y.o. female [phone] with IUP at 35 weeks 5 days (dated by LMP, estimated date of delivery 04/23/2024) who has a PMH as below including but limited to depression, GERD, trichomoniasis.  [facility] with severe features.  She was started on magnesium  per standard therapy.  Overnight on 8/7 and into the early morning hours of 8/8, she had fetal heart rate deceleration to the 80s for 1 to 2 minutes.  She had been receiving ripening with misoprostol  and Foley balloon as well as both AROM and Pitocin  for induction.  She then progressed to complete and she had a spontaneous vaginal delivery.  Immediately following delivery of baby followed by placenta, frank gush of blood was noted.  Pitocin  drip was started again and TXA was given.  Heavy bleeding continued and Cytotec  800 mcg was given rectally.  Heavy bleeding continues and uterus was becoming more firm.  Uterine sweep done and code hemorrhage was called.  An additional 200 mcg of Cytotec  buccal he was given [name] was inserted and suction applied as well as Hemabate  given.  Secondary was then applied.  Blood was ordered via MTP protocol.  IR was consulted and she was taken to the suite where she immediately had a seizure and required intubation. During IR workup, she was found to have active hemorrhage from the right uterine artery branch as well as left.  She received right coil embolization and left Gelfoam embolization.  Overall she received 10 units PRBC, 8 units FFP, 4 units platelets, 2u cryo, 2u Albumin , 2.5L fluids. EBL was at 3900-4057ml.  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  Significant Hospital Events: Including procedures, antibiotic start and stop dates in addition to other pertinent events   8/6 admit 8/8 vaginal delivery followed by post partum hemorrhage, taken to IR   Interim History / Subjective:  Sedated on Propofol . Repeat CBC pending.  Objective:  Blood pressure 115/83, pulse 78, temperature (!) 94.8 F (34.9 C), temperature source Esophageal, resp. rate 18, height 5' 5 (1.651 m), weight 90 kg, last menstrual period 07/18/2023, SpO2 94%, unknown if currently breastfeeding.    Vent Mode: PRVC FiO2 (%):  [40 %] 40 % Set Rate:  [16 bmp] 16 bmp Vt Set:  [460 mL] 460 mL PEEP:  [5 cmH20] 5 cmH20 Plateau Pressure:  [18 cmH20] 18 cmH20   Intake/Output Summary (Last 24 hours) at 03/26/2024 1021 Last data filed at 03/26/2024 0900 Gross per 24 hour  Intake 12624.23 ml  Output 7228 ml  Net 5396.23 ml   Filed Weights   03/25/24 0809 03/26/24 0813  Weight: 83.9 kg 90 kg     Physical Exam: General: Young AA female, critically ill. Neuro: Sedated, not responsive. HEENT: [place]/AT. Sclerae anicteric. ETT in place. Cardiovascular: RRR, no M/R/G.  Lungs: Respirations even and unlabored.  CTA bilaterally, No W/R/R. Abdomen: BS x 4, soft, NT/ND.  Musculoskeletal:  No gross deformities, no edema.  GU: Jada device in place per OB. No current active bleeding.  Assessment & Plan:   Post partum hemorrhage - 2/2 uterine artery bleed.  Now resolved following IR bilateral uterine artery embolization.  She is status post MTP (10 units PRBC, 8 units FFP, 4 units platelets, 2u cryo and EBL was at 3900-407ml). DIC - 2/2  above. - OB following and managing, appreciate the management/assistance. - IR following, appreciate the assistance. - Maintain uterine Jada device per OB. - Follow-up H&H following MTP every 6 hours x 3. - Transfuse for Hgb < 7.  Acute hypoxic respiratory failure s/p intubation. - Full vent support. - Hold off on weaning/SBT today. - Consider some Lasix after MTP, will follow vent mechanics and reassess. - Follow CXR after MTP.  Seizures with history of preeclampsia. - Continue magnesium . - Hold AED's for now, consider neuro consult if status not improving/recurrent seizures. - BP control per OB.    Hypocalcemia. - 3g Ca gluconate especially after MTP. - Follow BMP.  Hx depression. - Supportive care.   Best practice (evaluated daily):  Diet/type: NPO DVT prophylaxis: SCD Pressure ulcer(s): pressure ulcer assessment deferred  GI prophylaxis: H2B Lines: Central line and Arterial Line Foley:  Yes, and it is still needed Code Status:  full code Last date of multidisciplinary goals of care discussion: None yet.  Labs   CBC: Recent Labs  Lab 03/24/24 1606 03/25/24 1020 03/25/24 1653 03/26/24 0246 03/26/24 9561 03/26/24 0601 03/26/24 0602 03/26/24 0836 03/26/24 0859  WBC 5.4 7.5 6.4  --   --  11.5*  --   --   --   HGB 11.3* 10.7* 10.6* 6.3*  --  5.0*  --   --  8.2*  HCT 34.9* 32.4* 32.1* 20.2*  --  14.8*  --   --  24.0*  MCV 87.3 85.9 86.3  --   --  88.1  --   --   --   PLT 275 257 241 124* DUPLICATE 72* 69* 59*  --     Basic Metabolic Panel: Recent Labs  Lab 03/24/24 1606 03/25/24 1653 03/26/24 0601 03/26/24 0836 03/26/24 0859  NA 134* 135 135 135 136  K 3.2* 3.6 3.9 3.7 3.7  CL 104 104 109 107  --   CO2 21* 24 16* 18*  --   GLUCOSE 94 96 215* 115*  --   BUN <5* <5* <5* <5*  --   CREATININE 0.62 0.69 0.81 0.63  --   CALCIUM  9.0 7.2* 5.5* 7.6*  --    GFR: Estimated Creatinine Clearance: 107.7 mL/min (by C-G formula based on SCr of 0.63  mg/dL). Recent Labs  Lab 03/24/24 1606 03/25/24 1020 03/25/24 1653 03/26/24 0601  WBC 5.4 7.5 6.4 11.5*    Liver Function Tests: Recent Labs  Lab 03/24/24 1606 03/25/24 1653 03/26/24 0601 03/26/24 0836  AST 34 23 18 18   ALT 23 16 11 14   ALKPHOS 123 99 28* 39  BILITOT 0.7 0.3 0.6 0.7  PROT 7.3 6.3* <3.0* 4.1*  ALBUMIN  2.9* 2.5* <1.5* 2.3*   No results for input(s): LIPASE, AMYLASE in the last 168 hours. No results for input(s): AMMONIA in the last 168 hours.  ABG    Component Value Date/Time   PHART 7.342 (L) 03/26/2024 0859   PCO2ART 38.7 03/26/2024 0859   PO2ART 58 (L) 03/26/2024 0859   HCO3 21.3 03/26/2024 0859   TCO2 23 03/26/2024 0859   ACIDBASEDEF 4.0 (H) 03/26/2024 0859  O2SAT 90 03/26/2024 0859     Coagulation Profile: Recent Labs  Lab 03/26/24 0246 03/26/24 0438 03/26/24 0602 03/26/24 0836  INR QUANTITY NOT SUFFICIENT, UNABLE TO PERFORM TEST 1.3* 1.5* PENDING    Cardiac Enzymes: No results for input(s): CKTOTAL, CKMB, CKMBINDEX, TROPONINI in the last 168 hours.  HbA1C: Hgb A1c MFr Bld  Date/Time Value Ref Range Status  11/04/2023 09:13 AM 5.3 4.8 - 5.6 % Final    Comment:             Prediabetes: 5.7 - 6.4          Diabetes: >6.4          Glycemic control for adults with diabetes: <7.0     CBG: Recent Labs  Lab 03/26/24 0816  GLUCAP 124*    Review of Systems:   Unable to obtain as pt is encephalopathic.  Past Medical History:  She,  has a past medical history of Biological false positive RPR test (05/14/2016), Depression, GERD (gastroesophageal reflux disease), and Trichomoniasis.   Surgical History:   Past Surgical History:  Procedure Laterality Date   WISDOM TOOTH EXTRACTION       Social History:   reports that she has been smoking cigarettes. She has a 2.5 pack-year smoking history. She has never used smokeless tobacco. She reports that she does not currently use alcohol. She reports that she does not use  drugs.   Family History:  Her family history includes Cancer in her maternal aunt; Hypertension in her mother. There is no history of Hearing loss.   Allergies No Known Allergies   Home Medications  Prior to Admission medications   Medication Sig Start Date End Date Taking? Authorizing Provider  famotidine  (PEPCID ) 20 MG tablet Take 20 mg by mouth 2 (two) times daily.   Yes [provider]  fluconazole  (DIFLUCAN ) 150 MG tablet Take 150 mg by mouth once. 03/21/24  Yes [provider]  pantoprazole  (PROTONIX ) 40 MG tablet Take 40 mg by mouth daily. 03/09/24  Yes [provider]  prenatal vitamin w/FE, FA (PRENATAL 1 + 1) 27-1 MG TABS tablet Take 1 tablet by mouth daily at 12 noon.   Yes [provider]     Critical care time: 40 min.   Sammi Gore, PA - [facility] For pager details, please see AMION or use Epic chat  After 1900, please call [facility] for cross coverage needs 03/26/2024, 10:21 AM

## 2024-03-26 NOTE — Progress Notes (Signed)
 RN to ICU to monitor JADA. No more blood in tube and no blood leaking around JADA. Uterus at fundus with palpation.

## 2024-03-26 NOTE — Procedures (Signed)
 Extubation Procedure Note  Patient Details:   Name: Cindy Cruz DOB: 1986-08-05 MRN: [id number]   Airway Documentation:    Vent end date: 03/26/24 Vent end time: 1643   Evaluation  O2 sats: stable throughout Complications: No apparent complications Patient did tolerate procedure well. Bilateral Breath Sounds: Clear   Yes  Patient weaned 5/5 40% for 40 mins. Extubated per order. Patient had positive cuff leak prior. Strong cough. No stridor noted. BBS clear. Vitals stable throughout. RN and family at bedside. RT will continue to monitor.    Chistina Roston M 03/26/2024, 4:50 PM

## 2024-03-26 NOTE — Sedation Documentation (Signed)
 Spouse updated as to patient's status. Spouse verbalized understanding and informed of future updates

## 2024-03-26 NOTE — Progress Notes (Addendum)
 Neo drip decreased to 50mcg/min by DELENA Ang, CRNA

## 2024-03-27 ENCOUNTER — Inpatient Hospital Stay (HOSPITAL_COMMUNITY): Payer: MEDICAID

## 2024-03-27 DIAGNOSIS — O1493 Unspecified pre-eclampsia, third trimester: Secondary | ICD-10-CM | POA: Diagnosis not present

## 2024-03-27 DIAGNOSIS — N939 Abnormal uterine and vaginal bleeding, unspecified: Secondary | ICD-10-CM

## 2024-03-27 DIAGNOSIS — Z3A35 35 weeks gestation of pregnancy: Secondary | ICD-10-CM | POA: Diagnosis not present

## 2024-03-27 LAB — COMPREHENSIVE METABOLIC PANEL WITH GFR
ALT: 13 U/L (ref 0–44)
ALT: 13 U/L (ref 0–44)
AST: 20 U/L (ref 15–41)
AST: 23 U/L (ref 15–41)
Albumin: 2.2 g/dL — ABNORMAL LOW (ref 3.5–5.0)
Albumin: 2.1 g/dL — ABNORMAL LOW (ref 3.5–5.0)
Alkaline Phosphatase: 53 U/L (ref 38–126)
Alkaline Phosphatase: 70 U/L (ref 38–126)
Anion gap: 8 (ref 5–15)
Anion gap: 9 (ref 5–15)
BUN: <5 mg/dL — ABNORMAL LOW (ref 6–20)
BUN: 6 mg/dL (ref 6–20)
CO2: 21 mmol/L — ABNORMAL LOW (ref 22–32)
CO2: 23 mmol/L (ref 22–32)
Calcium: 7.5 mg/dL — ABNORMAL LOW (ref 8.9–10.3)
Calcium: 7.8 mg/dL — ABNORMAL LOW (ref 8.9–10.3)
Chloride: 106 mmol/L (ref 98–111)
Chloride: 104 mmol/L (ref 98–111)
Creatinine, Ser: 0.76 mg/dL (ref 0.44–1.00)
Creatinine, Ser: 0.77 mg/dL (ref 0.44–1.00)
GFR, Estimated: >60 mL/min (ref >60)
GFR, Estimated: >60 mL/min (ref >60)
Glucose, Bld: 81 mg/dL (ref 70–99)
Glucose, Bld: 75 mg/dL (ref 70–99)
Potassium: 3.7 mmol/L (ref 3.5–5.1)
Potassium: 3.7 mmol/L (ref 3.5–5.1)
Sodium: 135 mmol/L (ref 135–145)
Sodium: 136 mmol/L (ref 135–145)
Total Bilirubin: 0.6 mg/dL (ref 0.0–1.2)
Total Bilirubin: 0.4 mg/dL (ref 0.0–1.2)
Total Protein: 4.5 g/dL — ABNORMAL LOW (ref 6.5–8.1)
Total Protein: 4.6 g/dL — ABNORMAL LOW (ref 6.5–8.1)

## 2024-03-27 LAB — BPAM PLATELET PHERESIS
Blood Product Expiration Date: 202508112359
Blood Product Expiration Date: 202508112359
ISSUE DATE / TIME: 202508080703
ISSUE DATE / TIME: 202508080703
Unit Type and Rh: 6200
Unit Type and Rh: 7300

## 2024-03-27 LAB — BPAM CRYOPRECIPITATE
Blood Product Expiration Date: 202508080931
Blood Product Expiration Date: 202508122359
ISSUE DATE / TIME: 202508080324
ISSUE DATE / TIME: 202508080347
Unit Type and Rh: 5100
Unit Type and Rh: 7300

## 2024-03-27 LAB — TYPE AND SCREEN
ABO/RH(D): O POS
Antibody Screen: NEGATIVE
Unit division: 0
Unit division: 0
Unit division: 0
Unit division: 0
Unit division: 0
Unit division: 0
Unit division: 0
Unit division: 0
Unit division: 0
Unit division: 0
Unit division: 0
Unit division: 0
Unit division: 0
Unit division: 0
Unit division: 0
Unit division: 0

## 2024-03-27 LAB — BPAM RBC
Blood Product Expiration Date: 202508292359
Blood Product Expiration Date: 202508292359
Blood Product Expiration Date: 202508292359
Blood Product Expiration Date: 202508292359
Blood Product Expiration Date: 202508302359
Blood Product Expiration Date: 202508312359
Blood Product Expiration Date: 202509012359
Blood Product Expiration Date: 202509012359
Blood Product Expiration Date: 202509112359
Blood Product Expiration Date: 202509112359
Blood Product Expiration Date: 202509112359
Blood Product Expiration Date: 202509112359
Blood Product Expiration Date: 202509112359
Blood Product Expiration Date: 202509112359
Blood Product Expiration Date: 202509112359
Blood Product Expiration Date: 202509112359
ISSUE DATE / TIME: 202508080221
ISSUE DATE / TIME: 202508080221
ISSUE DATE / TIME: 202508080221
ISSUE DATE / TIME: 202508080221
ISSUE DATE / TIME: 202508080239
ISSUE DATE / TIME: 202508080239
ISSUE DATE / TIME: 202508080239
ISSUE DATE / TIME: 202508080239
ISSUE DATE / TIME: 202508080239
ISSUE DATE / TIME: 202508080239
ISSUE DATE / TIME: 202508080239
ISSUE DATE / TIME: 202508080239
ISSUE DATE / TIME: 202508080616
ISSUE DATE / TIME: 202508080616
ISSUE DATE / TIME: 202508080616
ISSUE DATE / TIME: 202508080616
Unit Type and Rh: 5100
Unit Type and Rh: 5100
Unit Type and Rh: 5100
Unit Type and Rh: 5100
Unit Type and Rh: 5100
Unit Type and Rh: 5100
Unit Type and Rh: 5100
Unit Type and Rh: 5100
Unit Type and Rh: 5100
Unit Type and Rh: 5100
Unit Type and Rh: 5100
Unit Type and Rh: 5100
Unit Type and Rh: 5100
Unit Type and Rh: 5100
Unit Type and Rh: 5100
Unit Type and Rh: 5100

## 2024-03-27 LAB — CBC
HCT: 20.7 % — ABNORMAL LOW (ref 36.0–46.0)
HCT: 21.1 % — ABNORMAL LOW (ref 36.0–46.0)
Hemoglobin: 7.1 g/dL — ABNORMAL LOW (ref 12.0–15.0)
Hemoglobin: 7.1 g/dL — ABNORMAL LOW (ref 12.0–15.0)
MCH: 29.3 pg (ref 26.0–34.0)
MCH: 29.6 pg (ref 26.0–34.0)
MCHC: 34.3 g/dL (ref 30.0–36.0)
MCHC: 33.6 g/dL (ref 30.0–36.0)
MCV: 85.5 fL (ref 80.0–100.0)
MCV: 87.9 fL (ref 80.0–100.0)
Platelets: 110 K/uL — ABNORMAL LOW (ref 150–400)
Platelets: 109 K/uL — ABNORMAL LOW (ref 150–400)
RBC: 2.42 MIL/uL — ABNORMAL LOW (ref 3.87–5.11)
RBC: 2.4 MIL/uL — ABNORMAL LOW (ref 3.87–5.11)
RDW: 15.3 % (ref 11.5–15.5)
RDW: 15.2 % (ref 11.5–15.5)
WBC: 17.3 K/uL — ABNORMAL HIGH (ref 4.0–10.5)
WBC: 18.3 K/uL — ABNORMAL HIGH (ref 4.0–10.5)
nRBC: 0 % (ref 0.0–0.2)
nRBC: 0 % (ref 0.0–0.2)

## 2024-03-27 LAB — PREPARE CRYOPRECIPITATE
Unit division: 0
Unit division: 0

## 2024-03-27 LAB — PREPARE PLATELET PHERESIS
Unit division: 0
Unit division: 0

## 2024-03-27 LAB — TRIGLYCERIDES: Triglycerides: 82 mg/dL (ref <150)

## 2024-03-27 LAB — PROTIME-INR
INR: 1.2 (ref 0.8–1.2)
Prothrombin Time: 15.8 s — ABNORMAL HIGH (ref 11.4–15.2)

## 2024-03-27 LAB — HEMOGLOBIN AND HEMATOCRIT, BLOOD
HCT: 22.3 % — ABNORMAL LOW (ref 36.0–46.0)
Hemoglobin: 7.7 g/dL — ABNORMAL LOW (ref 12.0–15.0)

## 2024-03-27 MED ORDER — FAMOTIDINE 20 MG PO TABS
20.0000 mg | ORAL_TABLET | Freq: Two times a day (BID) | ORAL | Status: DC
  Administered 2024-03-27 – 2024-03-29 (×5): 20 mg via ORAL
  Filled 2024-03-27 (×4): qty 1

## 2024-03-27 MED ORDER — POLYETHYLENE GLYCOL 3350 17 G PO PACK: 17.0000 g | PACK | Freq: Every day | ORAL | Status: DC | PRN

## 2024-03-27 MED ORDER — DOCUSATE SODIUM 100 MG PO CAPS: 100.0000 mg | ORAL_CAPSULE | Freq: Two times a day (BID) | ORAL | Status: DC | PRN

## 2024-03-27 MED ORDER — FUROSEMIDE 10 MG/ML IJ SOLN
40.0000 mg | Freq: Once | INTRAMUSCULAR | Status: AC
  Administered 2024-03-27: 40 mg via INTRAVENOUS
  Filled 2024-03-27: qty 4

## 2024-03-27 MED ORDER — NIFEDIPINE ER OSMOTIC RELEASE 30 MG PO TB24
30.0000 mg | ORAL_TABLET | Freq: Every day | ORAL | Status: DC
  Administered 2024-03-27: 30 mg via ORAL
  Filled 2024-03-27: qty 1

## 2024-03-27 MED ORDER — FUROSEMIDE 20 MG PO TABS
20.0000 mg | ORAL_TABLET | Freq: Every day | ORAL | Status: DC
  Administered 2024-03-27 – 2024-03-29 (×4): 20 mg via ORAL
  Filled 2024-03-27 (×3): qty 1

## 2024-03-27 MED ORDER — POLYETHYLENE GLYCOL 3350 17 G PO PACK
17.0000 g | PACK | Freq: Every day | ORAL | Status: DC
  Filled 2024-03-27: qty 1

## 2024-03-27 MED ORDER — OXYCODONE HCL 5 MG PO TABS
5.0000 mg | ORAL_TABLET | ORAL | Status: DC | PRN
  Administered 2024-03-27 – 2024-03-29 (×6): 10 mg via ORAL
  Filled 2024-03-27 (×5): qty 2

## 2024-03-27 MED ORDER — ENOXAPARIN SODIUM 40 MG/0.4ML IJ SOSY
40.0000 mg | PREFILLED_SYRINGE | INTRAMUSCULAR | Status: DC
  Administered 2024-03-28 – 2024-03-29 (×3): 40 mg via SUBCUTANEOUS
  Filled 2024-03-27 (×2): qty 0.4

## 2024-03-27 MED ORDER — IBUPROFEN 600 MG PO TABS
600.0000 mg | ORAL_TABLET | Freq: Four times a day (QID) | ORAL | Status: DC | PRN
  Administered 2024-03-28 – 2024-03-29 (×5): 600 mg via ORAL
  Filled 2024-03-27 (×4): qty 1

## 2024-03-27 MED ORDER — NIFEDIPINE ER OSMOTIC RELEASE 60 MG PO TB24
60.0000 mg | ORAL_TABLET | Freq: Every day | ORAL | Status: DC
  Administered 2024-03-28: 60 mg via ORAL
  Filled 2024-03-27: qty 1

## 2024-03-27 MED ORDER — METOCLOPRAMIDE HCL 5 MG/ML IJ SOLN: 10.0000 mg | Freq: Four times a day (QID) | INTRAMUSCULAR | Status: DC | PRN

## 2024-03-27 MED ORDER — OXYCODONE HCL 5 MG PO TABS: 10.0000 mg | ORAL_TABLET | ORAL | Status: DC | PRN

## 2024-03-27 MED ORDER — DOCUSATE SODIUM 50 MG/5ML PO LIQD
100.0000 mg | Freq: Two times a day (BID) | ORAL | Status: DC
  Filled 2024-03-27 (×2): qty 10

## 2024-03-27 NOTE — Anesthesia Postprocedure Evaluation (Addendum)
 Anesthesia Post Note  Patient: Cindy Cruz  Procedure(s) Performed: AN AD HOC LABOR EPIDURAL     Patient location during evaluation: ICU Anesthesia Type: Epidural Level of consciousness: sedated and patient cooperative Pain management: pain level controlled Vital Signs Assessment: post-procedure vital signs reviewed and stable Respiratory status: spontaneous breathing Cardiovascular status: stable Postop Assessment: no headache, no backache, no apparent nausea or vomiting and patient able to bend at knees Anesthetic complications: no Comments: Epidural still in place. Repeating CBC and coags prior to removal   Plts 110, INR 1.2. Epidural removed at 11:41 03/27/2024. Tip intact.   No notable events documented.  Last Vitals:  Vitals:   03/27/24 0700 03/27/24 0709  BP: 127/77   Pulse: 80   Resp: 20   Temp:  36.9 C  SpO2: 92%     Last Pain:  Vitals:   03/27/24 0722  TempSrc:   PainSc: 10-Worst pain ever                 Norleen Pope

## 2024-03-27 NOTE — Plan of Care (Signed)
  Problem: Education: Goal: Knowledge of disease or condition will improve Outcome: Progressing Goal: Knowledge of the prescribed therapeutic regimen will improve Outcome: Progressing   Problem: Fluid Volume: Goal: Peripheral tissue perfusion will improve Outcome: Progressing   Problem: Clinical Measurements: Goal: Complications related to disease process, condition or treatment will be avoided or minimized Outcome: Progressing   Problem: Education: Goal: Knowledge of General Education information will improve Description: Including pain rating scale, medication(s)/side effects and non-pharmacologic comfort measures Outcome: Progressing   Problem: Health Behavior/Discharge Planning: Goal: Ability to manage health-related needs will improve Outcome: Progressing   Problem: Clinical Measurements: Goal: Ability to maintain clinical measurements within normal limits will improve Outcome: Progressing Goal: Will remain free from infection Outcome: Progressing Goal: Diagnostic test results will improve Outcome: Progressing Goal: Respiratory complications will improve Outcome: Progressing Goal: Cardiovascular complication will be avoided Outcome: Progressing   Problem: Activity: Goal: Risk for activity intolerance will decrease Outcome: Progressing   Problem: Nutrition: Goal: Adequate nutrition will be maintained Outcome: Progressing   Problem: Coping: Goal: Level of anxiety will decrease Outcome: Progressing   Problem: Elimination: Goal: Will not experience complications related to bowel motility Outcome: Progressing Goal: Will not experience complications related to urinary retention Outcome: Progressing   Problem: Pain Managment: Goal: General experience of comfort will improve and/or be controlled Outcome: Progressing   Problem: Safety: Goal: Ability to remain free from injury will improve Outcome: Progressing   Problem: Skin Integrity: Goal: Risk for impaired  skin integrity will decrease Outcome: Progressing   Problem: Education: Goal: Knowledge of Childbirth will improve Outcome: Progressing Goal: Ability to make informed decisions regarding treatment and plan of care will improve Outcome: Progressing Goal: Ability to state and carry out methods to decrease the pain will improve Outcome: Progressing Goal: Individualized Educational Video(s) Outcome: Progressing   Problem: Coping: Goal: Ability to verbalize concerns and feelings about labor and delivery will improve Outcome: Progressing   Problem: Role Relationship: Goal: Will demonstrate positive interactions with the child Outcome: Progressing   Problem: Safety: Goal: Risk of complications during labor and delivery will decrease Outcome: Progressing   Problem: Pain Management: Goal: Relief or control of pain from uterine contractions will improve Outcome: Progressing   Problem: Activity: Goal: Ability to tolerate increased activity will improve Outcome: Progressing   Problem: Respiratory: Goal: Ability to maintain a clear airway and adequate ventilation will improve Outcome: Progressing   Problem: Role Relationship: Goal: Method of communication will improve Outcome: Progressing

## 2024-03-27 NOTE — Progress Notes (Signed)
 NAME:  Cindy Cruz, MRN:  [id number], DOB:  February 26, 1986, LOS: 3 ADMISSION DATE:  03/24/2024, CONSULTATION DATE:  03/26/24 REFERRING MD: Kandis  CHIEF COMPLAINT:  03/26/24   History of Present Illness:  Pt is encephelopathic; therefore, this HPI is obtained from chart review. Cindy Cruz is a 38 y.o. female [phone] with IUP at 35 weeks 5 days (dated by LMP, estimated date of delivery 04/23/2024) who has a PMH as below including but limited to depression, GERD, trichomoniasis.  [facility] with severe features.  She was started on magnesium  per standard therapy.  Overnight on 8/7 and into the early morning hours of 8/8, she had fetal heart rate deceleration to the 80s for 1 to 2 minutes.  She had been receiving ripening with misoprostol  and Foley balloon as well as both AROM and Pitocin  for induction.  She then progressed to complete and she had a spontaneous vaginal delivery.  Immediately following delivery of baby followed by placenta, frank gush of blood was noted.  Pitocin  drip was started again and TXA was given.  Heavy bleeding continued and Cytotec  800 mcg was given rectally.  Heavy bleeding continues and uterus was becoming more firm.  Uterine sweep done and code hemorrhage was called.  An additional 200 mcg of Cytotec  buccal he was given Cindy Cruz was inserted and suction applied as well as Hemabate  given.  Secondary was then applied.  Blood was ordered via MTP protocol.  IR was consulted and she was taken to the suite where she immediately had a seizure and required intubation. During IR workup, she was found to have active hemorrhage from the right uterine artery branch as well as left.  She received right coil embolization and left Gelfoam embolization.  Overall she received 10 units PRBC, 8 units FFP, 4 units platelets, 2u cryo, 2u Albumin , 2.5L fluids. EBL was at 3900-4070ml.  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  Significant Hospital Events: Including procedures, antibiotic start and stop dates in addition to other pertinent events   8/6 admit 8/8 vaginal delivery followed by post partum hemorrhage, taken to IR. Extubated later that afternoon  Interim History / Subjective:   No distress this morning. Objective:  Blood pressure 127/77, pulse 80, temperature 98.5 F (36.9 C), temperature source Oral, resp. rate 20, height 5' 5 (1.651 m), weight 90 kg, last menstrual period 07/18/2023, SpO2 92%, unknown if currently breastfeeding.    Vent Mode: PSV;CPAP FiO2 (%):  [40 %] 40 % Set Rate:  [16 bmp] 16 bmp Vt Set:  [460 mL] 460 mL PEEP:  [5 cmH20] 5 cmH20 Pressure Support:  [5 cmH20] 5 cmH20 Plateau Pressure:  [14 cmH20-18 cmH20] 14 cmH20   Intake/Output Summary (Last 24 hours) at 03/27/2024 0732 Last data filed at 03/27/2024 0600 Gross per 24 hour  Intake 6103.51 ml  Output 1975 ml  Net 4128.51 ml   Filed Weights   03/25/24 0809 03/26/24 0813  Weight: 83.9 kg 90 kg     Physical Exam: General pleasant 38 year old female lying in bed no acute distress HEENT normocephalic atraumatic no jugular venous distention is appreciated Pulmonary:  Clear to auscultation currently on room air Pcxr on 8/8 basilar atx Cardiac regular rate rhythm Abdomen soft nontender Extremities warm dry brisk cap refill Neuro intact Assessment & Plan:   Post partum hemorrhage - 2/2 uterine artery bleed.  Now resolved following IR bilateral uterine artery embolization.  She is status  post MTP (10 units PRBC, 8 units FFP, 4 units platelets, 2u cryo and EBL was at 3900-4072ml). DIC - 2/2 above.  INR normalized H&H stable Plan Xfer to OB today F/u cbc and coags   Seizures with history of preeclampsia. Last EEg NEG Plan Bp control   Hx depression. Plan Supportive care. F/u w/ primary   Best practice (evaluated daily):  Diet/type: NPO DVT prophylaxis: SCD Pressure ulcer(s): pressure ulcer assessment deferred  GI prophylaxis: H2B Lines: Central line and Arterial Line Foley:  Yes, and it is still needed Code Status:  full code Last date of multidisciplinary goals of care discussion: None yet.     Critical care time: Not applicable   Transferred to [facility] after her epidural was out

## 2024-03-27 NOTE — Progress Notes (Signed)
 MOB was referred for history of depression. * Referral screened out by Clinical Social Worker because none of the following criteria appear to apply: ~ History of anxiety/depression during this pregnancy, or of post-partum depression following prior delivery. ~ Diagnosis of anxiety and/or depression within last 3 years. Per chart review, MOB's history of Depression dates back to 82. No mental health concerns noted in prenatal care records.   OR * MOB's symptoms currently being treated with medication and/or therapy. Please contact the Clinical Social Worker if needs arise, by [facility] request, or if MOB scores greater than 9/yes to question 10 on Edinburgh Postpartum Depression Screen.  Signed,  Sharyne LOIS Roulette, MSW, LCSWA, LCASA 06-29-2024 4:34 PM

## 2024-03-27 NOTE — Progress Notes (Signed)
 Referring Provider(s): Jerilynn Buddle  Supervising Physician: Philip Cornet  Patient Status:  [facility] - In-pt  Chief Complaint:  Post partum hemorrhage - s/p emergent uterine embolization  Brief History:  Cindy Cruz is a 38 year old  female who had massive uterine bleed after spontaneous vaginal delivery.  IR was consulted for emergent uterine embolization.   While in IR she had a seizure and was subsequently intubated.   She was found to have active hemorrhage from right uterine artery branch as well as left.   Dr. Jenna performed a right coil embolization and left Gelfoam embolization.   Overall received 10 units of PRBC 8 units of FFP and 4 units of platelets, 2 units of cryo 2 units of albumin  and 2.5 L of fluid.   Estimated blood loss was around 4000 mL.  Subjective:  Today she looks much better. She is sitting up in bed eating. Feeling better.  Allergies: Patient has no known allergies.  Medications: Prior to Admission medications   Medication Sig Start Date End Date Taking? Authorizing Provider  famotidine  (PEPCID ) 20 MG tablet Take 20 mg by mouth 2 (two) times daily.   Yes [provider]  fluconazole  (DIFLUCAN ) 150 MG tablet Take 150 mg by mouth once. 03/21/24  Yes [provider]  pantoprazole  (PROTONIX ) 40 MG tablet Take 40 mg by mouth daily. 03/09/24  Yes [provider]  prenatal vitamin w/FE, FA (PRENATAL 1 + 1) 27-1 MG TABS tablet Take 1 tablet by mouth daily at 12 noon.   Yes [provider]     Vital Signs: BP 124/80 (BP Location: Right Arm)   Pulse 89   Temp 98.2 F (36.8 C) (Oral)   Resp 20   Ht 5' 5 (1.651 m)   Wt 198 lb 6.6 oz (90 kg)   LMP 07/18/2023 (Approximate)   SpO2 96%   Breastfeeding Unknown   BMI 33.02 kg/m   Physical Exam Vitals reviewed.  Constitutional:      Appearance: Normal appearance.  Cardiovascular:     Rate and Rhythm: Normal rate.  Pulmonary:     Effort: Pulmonary effort is normal.  No respiratory distress.  Abdominal:     Palpations: Abdomen is soft.  Neurological:     General: No focal deficit present.     Mental Status: She is alert and oriented to person, place, and time.  Psychiatric:        Mood and Affect: Mood normal.        Behavior: Behavior normal.        Thought Content: Thought content normal.        Judgment: Judgment normal.   Right groin access still has a catheter in place. Dressing intact. No bleeding.   Labs:  CBC: Recent Labs    03/25/24 1653 03/26/24 0246 03/26/24 0601 03/26/24 0602 03/26/24 0836 03/26/24 0859 03/26/24 1314 03/26/24 1700 03/27/24 0043 03/27/24 0817  WBC 6.4  --  11.5*  --   --   --   --  13.5*  --  17.3*  HGB 10.6*   < > 5.0*  --   --    < > 8.3* 8.2* 7.7* 7.1*  HCT 32.1*   < > 14.8*  --   --    < > 23.7* 23.9* 22.3* 20.7*  PLT 241   < > 72* 69* 59*  --   --  110*  --  110*   < > = values in this interval not displayed.  COAGS: Recent Labs    03/26/24 0246 03/26/24 0438 03/26/24 0602 03/26/24 0836 03/27/24 0817  INR QUANTITY NOT SUFFICIENT, UNABLE TO PERFORM TEST 1.3* 1.5* 1.3* 1.2  APTT QUANTITY NOT SUFFICIENT, UNABLE TO PERFORM TEST 31 33 30  --     BMP: Recent Labs    03/26/24 0601 03/26/24 0836 03/26/24 0859 03/26/24 1700 03/27/24 0817  NA 135 135 136 136 135  K 3.9 3.7 3.7 3.7 3.7  CL 109 107  --  105 106  CO2 16* 18*  --  22 21*  GLUCOSE 215* 115*  --  93 81  BUN <5* <5*  --  <5* <5*  CALCIUM  5.5* 7.6*  --  7.6* 7.5*  CREATININE 0.81 0.63  --  0.88 0.76  GFRNONAA >60 >60  --  >60 >60    LIVER FUNCTION TESTS: Recent Labs    03/26/24 0601 03/26/24 0836 03/26/24 1700 03/27/24 0817  BILITOT 0.6 0.7 0.4 0.6  AST 18 18 22 20   ALT 11 14 12 13   ALKPHOS 28* 39 43 53  PROT <3.0* 4.1* 4.0* 4.5*  ALBUMIN  <1.5* 2.3* 2.1* 2.2*    Assessment and Plan:  S/P Uterine artery embolization by Dr. Jenna for post partum hemorrhage.   Doing well now despite blood loss and requiring  numerous units of blood products.  Right groin with catheter still in place. No bleeding.  Electronically Signed: SARI GORMAN LAMP, PA-C 03/27/2024, 12:47 PM    I spent a total of 15 Minutes at the the patient's bedside AND on the patient's hospital floor or unit, greater than 50% of which was counseling/coordinating care for f/u uterine artery embo.

## 2024-03-27 NOTE — Progress Notes (Signed)
 Post Partum Day 1 Subjective: no complaints and tolerating PO  Objective: Blood pressure 124/80, pulse 89, temperature 98.2 F (36.8 C), temperature source Oral, resp. rate 20, height 5' 5 (1.651 m), weight 90 kg, last menstrual period 07/18/2023, SpO2 96%, unknown if currently breastfeeding.  Physical Exam:  General: alert, cooperative, and no distress Lochia: appropriate Uterine Fundus: firm Incision:  DVT Evaluation: No evidence of DVT seen on physical exam.  Recent Labs    03/27/24 0043 03/27/24 0817  HGB 7.7* 7.1*  HCT 22.3* 20.7*   CBC    Component Value Date/Time   WBC 17.3 (H) 03/27/2024 0817   RBC 2.42 (L) 03/27/2024 0817   HGB 7.1 (L) 03/27/2024 0817   HGB 10.4 (L) 02/18/2024 0939   HCT 20.7 (L) 03/27/2024 0817   HCT 31.9 (L) 02/18/2024 0939   PLT 110 (L) 03/27/2024 0817   PLT 219 02/18/2024 0939   MCV 85.5 03/27/2024 0817   MCV 89 02/18/2024 0939   MCH 29.3 03/27/2024 0817   MCHC 34.3 03/27/2024 0817   RDW 15.3 03/27/2024 0817   RDW 15.1 02/18/2024 0939   LYMPHSABS 2.2 11/04/2023 0913   MONOABS 0.6 02/11/2020 1701   EOSABS 0.1 11/04/2023 0913   BASOSABS 0.0 11/04/2023 0913      Latest Ref Rng & Units 03/27/2024    8:17 AM 03/26/2024    5:00 PM 03/26/2024    8:59 AM  CMP  Glucose 70 - 99 mg/dL 81  93    BUN 6 - 20 mg/dL <5  <5    Creatinine 9.55 - 1.00 mg/dL 9.23  9.11    Sodium 864 - 145 mmol/L 135  136  136   Potassium 3.5 - 5.1 mmol/L 3.7  3.7  3.7   Chloride 98 - 111 mmol/L 106  105    CO2 22 - 32 mmol/L 21  22    Calcium  8.9 - 10.3 mg/dL 7.5  7.6    Total Protein 6.5 - 8.1 g/dL 4.5  4.0    Total Bilirubin 0.0 - 1.2 mg/dL 0.6  0.4    Alkaline Phos 38 - 126 U/L 53  43    AST 15 - 41 U/L 20  22    ALT 0 - 44 U/L 13  12       Assessment/Plan: Transfer from ICU today is planned Follow CBC and consider transfusion if drop in HB or if Sx   LOS: 3 days   Lynwood Solomons, MD 03/27/2024, 1:10 PM

## 2024-03-27 NOTE — Anesthesia Postprocedure Evaluation (Signed)
 Anesthesia Post Note  Patient: Cindy Cruz  Procedure(s) Performed: RADIOLOGY WITH ANESTHESIA     Patient location during evaluation: PACU Anesthesia Type: General Level of consciousness: patient cooperative and awake and alert Pain management: pain level controlled Vital Signs Assessment: post-procedure vital signs reviewed and stable Respiratory status: spontaneous breathing and respiratory function stable Cardiovascular status: stable Anesthetic complications: no   No notable events documented.  Last Vitals:  Vitals:   03/27/24 0700 03/27/24 0709  BP: 127/77   Pulse: 80   Resp: 20   Temp:  36.9 C  SpO2: 92%     Last Pain:  Vitals:   03/27/24 0722  TempSrc:   PainSc: 10-Worst pain ever                 Norleen Pope

## 2024-03-27 NOTE — Progress Notes (Signed)
 Right femoral sheath removed at 1459.

## 2024-03-28 LAB — COMPREHENSIVE METABOLIC PANEL WITH GFR
ALT: 15 U/L (ref 0–44)
ALT: 15 U/L (ref 0–44)
AST: 26 U/L (ref 15–41)
AST: 27 U/L (ref 15–41)
Albumin: 2.5 g/dL — ABNORMAL LOW (ref 3.5–5.0)
Albumin: 2.7 g/dL — ABNORMAL LOW (ref 3.5–5.0)
Alkaline Phosphatase: 79 U/L (ref 38–126)
Alkaline Phosphatase: 100 U/L (ref 38–126)
Anion gap: 11 (ref 5–15)
Anion gap: 11 (ref 5–15)
BUN: 6 mg/dL (ref 6–20)
BUN: 6 mg/dL (ref 6–20)
CO2: 26 mmol/L (ref 22–32)
CO2: 27 mmol/L (ref 22–32)
Calcium: 8.5 mg/dL — ABNORMAL LOW (ref 8.9–10.3)
Calcium: 9.2 mg/dL (ref 8.9–10.3)
Chloride: 100 mmol/L (ref 98–111)
Chloride: 100 mmol/L (ref 98–111)
Creatinine, Ser: 0.88 mg/dL (ref 0.44–1.00)
Creatinine, Ser: 0.74 mg/dL (ref 0.44–1.00)
GFR, Estimated: >60 mL/min (ref >60)
GFR, Estimated: >60 mL/min (ref >60)
Glucose, Bld: 81 mg/dL (ref 70–99)
Glucose, Bld: 81 mg/dL (ref 70–99)
Potassium: 3.2 mmol/L — ABNORMAL LOW (ref 3.5–5.1)
Potassium: 3.8 mmol/L (ref 3.5–5.1)
Sodium: 137 mmol/L (ref 135–145)
Sodium: 138 mmol/L (ref 135–145)
Total Bilirubin: 0.7 mg/dL (ref 0.0–1.2)
Total Bilirubin: 0.9 mg/dL (ref 0.0–1.2)
Total Protein: 5.7 g/dL — ABNORMAL LOW (ref 6.5–8.1)
Total Protein: 6.5 g/dL (ref 6.5–8.1)

## 2024-03-28 LAB — CBC
HCT: 23.3 % — ABNORMAL LOW (ref 36.0–46.0)
HCT: 27.1 % — ABNORMAL LOW (ref 36.0–46.0)
Hemoglobin: 7.8 g/dL — ABNORMAL LOW (ref 12.0–15.0)
Hemoglobin: 9.1 g/dL — ABNORMAL LOW (ref 12.0–15.0)
MCH: 29.2 pg (ref 26.0–34.0)
MCH: 29.4 pg (ref 26.0–34.0)
MCHC: 33.5 g/dL (ref 30.0–36.0)
MCHC: 33.6 g/dL (ref 30.0–36.0)
MCV: 87.3 fL (ref 80.0–100.0)
MCV: 87.7 fL (ref 80.0–100.0)
Platelets: 143 K/uL — ABNORMAL LOW (ref 150–400)
Platelets: 184 K/uL (ref 150–400)
RBC: 2.67 MIL/uL — ABNORMAL LOW (ref 3.87–5.11)
RBC: 3.09 MIL/uL — ABNORMAL LOW (ref 3.87–5.11)
RDW: 14.8 % (ref 11.5–15.5)
RDW: 14.5 % (ref 11.5–15.5)
WBC: 20 K/uL — ABNORMAL HIGH (ref 4.0–10.5)
WBC: 21.7 K/uL — ABNORMAL HIGH (ref 4.0–10.5)
nRBC: 0 % (ref 0.0–0.2)
nRBC: 0 % (ref 0.0–0.2)

## 2024-03-28 LAB — TYPE AND SCREEN
ABO/RH(D): O POS
Antibody Screen: NEGATIVE

## 2024-03-28 LAB — CULTURE, BETA STREP (GROUP B ONLY): Strep Gp B Culture: NEGATIVE

## 2024-03-28 MED ORDER — DIPHENHYDRAMINE HCL 50 MG/ML IJ SOLN: 12.5000 mg | Freq: Once | INTRAMUSCULAR | Status: DC

## 2024-03-28 MED ORDER — PANTOPRAZOLE SODIUM 40 MG PO TBEC
40.0000 mg | DELAYED_RELEASE_TABLET | Freq: Every day | ORAL | Status: DC
  Administered 2024-03-28 – 2024-03-29 (×3): 40 mg via ORAL
  Filled 2024-03-28 (×3): qty 1

## 2024-03-28 MED ORDER — POTASSIUM CHLORIDE 20 MEQ PO PACK
20.0000 meq | PACK | Freq: Two times a day (BID) | ORAL | Status: DC
  Administered 2024-03-28 – 2024-03-29 (×4): 20 meq via ORAL
  Filled 2024-03-28 (×3): qty 1

## 2024-03-28 MED ORDER — DIPHENHYDRAMINE HCL 25 MG PO CAPS
25.0000 mg | ORAL_CAPSULE | Freq: Once | ORAL | Status: AC
  Administered 2024-03-28: 25 mg via ORAL
  Filled 2024-03-28: qty 1

## 2024-03-28 MED ORDER — NIFEDIPINE ER OSMOTIC RELEASE 60 MG PO TB24
60.0000 mg | ORAL_TABLET | Freq: Two times a day (BID) | ORAL | Status: DC
  Administered 2024-03-28 – 2024-03-29 (×3): 60 mg via ORAL
  Filled 2024-03-28 (×2): qty 1

## 2024-03-28 MED ORDER — ZOLPIDEM TARTRATE 5 MG PO TABS
5.0000 mg | ORAL_TABLET | Freq: Every evening | ORAL | Status: DC | PRN
  Administered 2024-03-28: 5 mg via ORAL
  Filled 2024-03-28: qty 1

## 2024-03-28 NOTE — Progress Notes (Signed)
 Post Partum Day 2 Subjective: no complaints, up ad lib, voiding, and tolerating PO  Objective: Blood pressure (!) 138/91, pulse 99, temperature 99 F (37.2 C), temperature source Oral, resp. rate 18, height 5' 5 (1.651 m), weight 90 kg, last menstrual period 07/18/2023, SpO2 98%, unknown if currently breastfeeding.  Physical Exam:  General: alert, cooperative, and no distress Lochia: appropriate Uterine Fundus: firm Incision:  DVT Evaluation: No evidence of DVT seen on physical exam.  Recent Labs    03/27/24 1701 03/28/24 0417  HGB 7.1* 7.8*  HCT 21.1* 23.3*    Assessment/Plan: Potassium repletion Lasix  Procardia -follow BP for control   LOS: 4 days   Lynwood Solomons, MD 03/28/2024, 9:11 AM

## 2024-03-28 NOTE — Plan of Care (Signed)
  Problem: Education: Goal: Knowledge of disease or condition will improve Outcome: Progressing Goal: Knowledge of the prescribed therapeutic regimen will improve Outcome: Progressing   Problem: Fluid Volume: Goal: Peripheral tissue perfusion will improve Outcome: Progressing   Problem: Clinical Measurements: Goal: Complications related to disease process, condition or treatment will be avoided or minimized Outcome: Progressing   Problem: Education: Goal: Knowledge of General Education information will improve Description: Including pain rating scale, medication(s)/side effects and non-pharmacologic comfort measures Outcome: Progressing   Problem: Health Behavior/Discharge Planning: Goal: Ability to manage health-related needs will improve Outcome: Progressing   Problem: Clinical Measurements: Goal: Ability to maintain clinical measurements within normal limits will improve Outcome: Progressing Goal: Will remain free from infection Outcome: Progressing Goal: Diagnostic test results will improve Outcome: Progressing Goal: Respiratory complications will improve Outcome: Progressing Goal: Cardiovascular complication will be avoided Outcome: Progressing   Problem: Activity: Goal: Risk for activity intolerance will decrease Outcome: Progressing   Problem: Nutrition: Goal: Adequate nutrition will be maintained Outcome: Progressing   Problem: Coping: Goal: Level of anxiety will decrease Outcome: Progressing   Problem: Elimination: Goal: Will not experience complications related to bowel motility Outcome: Progressing Goal: Will not experience complications related to urinary retention Outcome: Progressing   Problem: Pain Managment: Goal: General experience of comfort will improve and/or be controlled Outcome: Progressing   Problem: Safety: Goal: Ability to remain free from injury will improve Outcome: Progressing   Problem: Skin Integrity: Goal: Risk for impaired  skin integrity will decrease Outcome: Progressing   Problem: Education: Goal: Knowledge of Childbirth will improve Outcome: Progressing Goal: Ability to make informed decisions regarding treatment and plan of care will improve Outcome: Progressing Goal: Ability to state and carry out methods to decrease the pain will improve Outcome: Progressing Goal: Individualized Educational Video(s) Outcome: Progressing   Problem: Coping: Goal: Ability to verbalize concerns and feelings about labor and delivery will improve Outcome: Progressing   Problem: Role Relationship: Goal: Will demonstrate positive interactions with the child Outcome: Progressing   Problem: Safety: Goal: Risk of complications during labor and delivery will decrease Outcome: Progressing   Problem: Pain Management: Goal: Relief or control of pain from uterine contractions will improve Outcome: Progressing   Problem: Activity: Goal: Ability to tolerate increased activity will improve Outcome: Progressing   Problem: Respiratory: Goal: Ability to maintain a clear airway and adequate ventilation will improve Outcome: Progressing   Problem: Role Relationship: Goal: Method of communication will improve Outcome: Progressing

## 2024-03-28 NOTE — Lactation Note (Addendum)
 This note was copied from a baby's chart. Lactation Consultation Note  Patient Name: Cindy Cruz Unijb'd Date: 03/28/2024 Age:37 hours Reason for consult: Initial assessment;Late-preterm 34-36.6wks;Infant < 5lbs;Other (Comment) (GHTN, PPH 3900-4000 ml, Mother intubated in ICU post partum)AMA  LC in to visit with P8 Mom of LPTI weighing 4 lbs 12.9 oz at birth.  Baby born by C/S for severe pre-eclampsia.  Baby transferred to NICU for hypothermia and then transferred to CN due to mother being admitted to ICU following PPH.  Mom stated she wanted to try breastfeeding, but she was separated from baby and was unable to start pumping.    Baby has been bottle feeding well. LC reviewed and demonstrated breast massage and hand expression.  LC offered to assist Mom with STS as this is the first step to breastfeeding.  LC provided a pumping top to tuck baby in and assisted her to pump on initiation setting.  During the pumping, baby noted to start cueing.  Mom states baby had last fed over 3 hrs ago.  LC offered to assist baby to latch to the breast.  LC stopped the pump.  Baby positioned in football hold on right breast.  Assisted Mom to support her breast back from areola, and support baby's head from ear to ear.  Taught Mom to tease baby with nipple to top lip and wait for a wide open gape of her mouth.  Baby able to latch onto the areola.  Baby initially sucked with deep jaw extensions for 30 seconds before showing fatigue.  Baby remained on the breast with non-nutritive sucking on and off for 10 mins total.  Plan recommended- 1- STS with baby 2- Offer the breast with strong feeding cues 3- Supplement with 22 cal formula, volume per guidelines 4- Pump both breasts on initiation setting for 15 mins, adding breast massage and hand expression. 5- ask for help prn.  Maternal Data Has patient been taught Hand Expression?: Yes Does the patient have breastfeeding experience prior to this  delivery?: Yes How long did the patient breastfeed?: 1 month  Feeding Mother's Current Feeding Choice: Breast Milk and Formula  LATCH Score Latch: Grasps breast easily, tongue down, lips flanged, rhythmical sucking.  Audible Swallowing: A few with stimulation  Type of Nipple: Everted at rest and after stimulation  Comfort (Breast/Nipple): Soft / non-tender  Hold (Positioning): Assistance needed to correctly position infant at breast and maintain latch.  LATCH Score: 8   Lactation Tools Discussed/Used Tools: Pump;Flanges;Hands-free pumping top;Bottle Flange Size: 18 Breast pump type: Double-Electric Breast Pump Pump Education: Setup, frequency, and cleaning;Milk Storage Reason for Pumping: support milk supply/LPTI/<5lbs Pumping frequency: Initiated DEBP at 56 hrs post partum, encouraged to pump every 3 hrs  Interventions Interventions: Breast feeding basics reviewed;Assisted with latch;Skin to skin;Breast massage;Hand express;Breast compression;Adjust position;Support pillows;Position options;DEBP;Education;LC Services brochure;CDC Guidelines for Breast Pump Cleaning  Discharge Discharge Education: Engorgement and breast care WIC Program: Yes  Consult Status Consult Status: Follow-up Date: 03/29/24 Follow-up type: In-patient    Claudene Aleck BRAVO 03/28/2024, 10:12 AM

## 2024-03-29 ENCOUNTER — Other Ambulatory Visit (HOSPITAL_COMMUNITY): Payer: Self-pay

## 2024-03-29 LAB — COMPREHENSIVE METABOLIC PANEL WITH GFR
ALT: 13 U/L (ref 0–44)
AST: 22 U/L (ref 15–41)
Albumin: 2.6 g/dL — ABNORMAL LOW (ref 3.5–5.0)
Alkaline Phosphatase: 95 U/L (ref 38–126)
Anion gap: 10 (ref 5–15)
BUN: 5 mg/dL — ABNORMAL LOW (ref 6–20)
CO2: 24 mmol/L (ref 22–32)
Calcium: 9.4 mg/dL (ref 8.9–10.3)
Chloride: 101 mmol/L (ref 98–111)
Creatinine, Ser: 0.68 mg/dL (ref 0.44–1.00)
GFR, Estimated: >60 mL/min (ref >60)
Glucose, Bld: 120 mg/dL — ABNORMAL HIGH (ref 70–99)
Potassium: 3.7 mmol/L (ref 3.5–5.1)
Sodium: 135 mmol/L (ref 135–145)
Total Bilirubin: 0.3 mg/dL (ref 0.0–1.2)
Total Protein: 6.6 g/dL (ref 6.5–8.1)

## 2024-03-29 LAB — CBC
HCT: 27.5 % — ABNORMAL LOW (ref 36.0–46.0)
Hemoglobin: 9.3 g/dL — ABNORMAL LOW (ref 12.0–15.0)
MCH: 29.2 pg (ref 26.0–34.0)
MCHC: 33.8 g/dL (ref 30.0–36.0)
MCV: 86.5 fL (ref 80.0–100.0)
Platelets: 212 K/uL (ref 150–400)
RBC: 3.18 MIL/uL — ABNORMAL LOW (ref 3.87–5.11)
RDW: 14.2 % (ref 11.5–15.5)
WBC: 16.6 K/uL — ABNORMAL HIGH (ref 4.0–10.5)
nRBC: 0 % (ref 0.0–0.2)

## 2024-03-29 MED ORDER — OXYCODONE HCL 5 MG PO TABS
5.0000 mg | ORAL_TABLET | ORAL | 0 refills | Status: AC | PRN
  Filled 2024-03-29: qty 10, 1d supply, fill #0

## 2024-03-29 MED ORDER — DIPHENHYDRAMINE HCL 25 MG PO CAPS
25.0000 mg | ORAL_CAPSULE | Freq: Four times a day (QID) | ORAL | Status: DC | PRN
  Administered 2024-03-29 (×2): 25 mg via ORAL
  Filled 2024-03-29: qty 1

## 2024-03-29 MED ORDER — POTASSIUM CHLORIDE 20 MEQ PO PACK
20.0000 meq | PACK | Freq: Two times a day (BID) | ORAL | 0 refills | Status: DC
  Filled 2024-03-29: qty 10, 5d supply, fill #0

## 2024-03-29 MED ORDER — IBUPROFEN 600 MG PO TABS
600.0000 mg | ORAL_TABLET | Freq: Four times a day (QID) | ORAL | 0 refills | Status: AC | PRN
Start: 2024-03-29 — End: ?
  Filled 2024-03-29: qty 30, 8d supply, fill #0

## 2024-03-29 MED ORDER — DIPHENHYDRAMINE HCL 25 MG PO TABS
25.0000 mg | ORAL_TABLET | Freq: Four times a day (QID) | ORAL | 0 refills | Status: AC | PRN
  Filled 2024-03-29: qty 17, 5d supply, fill #0

## 2024-03-29 MED ORDER — MEDROXYPROGESTERONE ACETATE 150 MG/ML IM SUSP
150.0000 mg | Freq: Once | INTRAMUSCULAR | Status: AC
  Administered 2024-03-29 (×2): 150 mg via INTRAMUSCULAR
  Filled 2024-03-29: qty 1

## 2024-03-29 MED ORDER — NIFEDIPINE ER 60 MG PO TB24
60.0000 mg | ORAL_TABLET | Freq: Two times a day (BID) | ORAL | 0 refills | Status: DC
  Filled 2024-03-29: qty 30, 15d supply, fill #0

## 2024-03-29 MED ORDER — MEASLES, MUMPS & RUBELLA VAC IJ SOLR
0.5000 mL | Freq: Once | INTRAMUSCULAR | Status: AC
  Administered 2024-03-29 (×2): 0.5 mL via SUBCUTANEOUS
  Filled 2024-03-29: qty 0.5

## 2024-03-29 MED ORDER — FUROSEMIDE 20 MG PO TABS
20.0000 mg | ORAL_TABLET | Freq: Every day | ORAL | 0 refills | Status: AC
  Filled 2024-03-29: qty 5, 5d supply, fill #0

## 2024-03-29 NOTE — Lactation Note (Signed)
 This note was copied from a baby's chart. Lactation Consultation Note  Patient Name: Cindy Cruz Date: 03/29/2024 Age:37 days Reason for consult: Follow-up assessment;Late-preterm 34-36.6wks;Infant < 5lbs;Other (Comment);Maternal discharge (PPH 4928 cc, AMA)  Visited with family of 78 75/20 weeks old AGA female Cindy Cruz; Cindy Cruz is a P8 and experienced breastfeeding. She voiced she hasn't had a chance to pump yet, she took baby to breast yesterday with the help of LC. Noticed baby cueing and offered assistance with latch, this LC took baby to the L side in cross cradle hold and she was able to latch right away, she was wide awake and alert. Noticed a few long extensions of the jaw which suggests NS patterns but no swallows heard during this 9 minutes feedings, mostly NNS patterns on and off. Cindy Cruz also noted that her breasts don't feel full when doing breast compressions during feeding noticed they were soft and compressible, they do no resemble lactating breasts at 82 hours post-partum possibly due to [facility] of 4928 cc.   Couplet is getting discharged today. Reviewed discharge education and the importance of consistent pumping whenever baby is getting a bottle for the onset of lactogenesis II and the prevention of engorgement. This LC assisted with washing Dr. Delores bottle parts to supplement baby after feeding at the breast. Cindy Cruz understands that due to birth weight and prematurity, she'll still need to be supplemented with EBM/formula after feedings/attempts at the breast. She politely declined a referral to [facility] OP; but voiced she'll be F/U with the [facility] office for lactation care. FOB present but asleep. All questions and concerns answered, family to contact [facility] services PRN.  Feeding Mother's Current Feeding Choice: Breast Milk and Formula  LATCH Score Latch: Grasps breast easily, tongue down, lips flanged, rhythmical sucking.  Audible Swallowing: None  Type of Nipple:  Everted at rest and after stimulation  Comfort (Breast/Nipple): Soft / non-tender  Hold (Positioning): No assistance needed to correctly position infant at breast.  LATCH Score: 8   Lactation Tools Discussed/Used Tools: Pump;Flanges;Hands-free pumping top Flange Size: 18 Breast pump type: Double-Electric Breast Pump Pump Education: Setup, frequency, and cleaning;Milk Storage Reason for Pumping: LPI < 5 lbs, PPH of 4928 cc Pumping frequency: Has not initiated pumping yet; MOB tired and falling asleep during LC consult Pumped volume: 0 mL  Interventions Interventions: Breast feeding basics reviewed;Assisted with latch;Breast compression;Support pillows;DEBP;Education  Discharge Discharge Education: Engorgement and breast care;Warning signs for feeding baby;Outpatient recommendation  Consult Status Consult Status: Complete Date: 03/29/24 Follow-up type: Call as needed   [name] 03/29/2024, 11:49 AM

## 2024-03-30 ENCOUNTER — Ambulatory Visit: Payer: MEDICAID

## 2024-03-31 LAB — PREPARE FRESH FROZEN PLASMA
Unit division: 0
Unit division: 0
Unit division: 0
Unit division: 0
Unit division: 0
Unit division: 0
Unit division: 0
Unit division: 0
Unit division: 0

## 2024-03-31 LAB — BPAM FFP
Blood Product Expiration Date: 202508122359
Blood Product Expiration Date: 202508122359
Blood Product Expiration Date: 202508132359
Blood Product Expiration Date: 202508252359
Blood Product Expiration Date: 202508252359
Blood Product Expiration Date: 202508082359
Blood Product Expiration Date: 202508102359
Blood Product Expiration Date: 202508122359
Blood Product Expiration Date: 202508122359
Blood Product Expiration Date: 202508122359
Blood Product Expiration Date: 202508122359
Blood Product Expiration Date: 202508132359
Blood Product Expiration Date: 202508132359
Blood Product Expiration Date: 202508132359
Blood Product Expiration Date: 202508222359
Blood Product Expiration Date: 202508252359
ISSUE DATE / TIME: 202508080228
ISSUE DATE / TIME: 202508080240
ISSUE DATE / TIME: 202508080240
ISSUE DATE / TIME: 202508080240
ISSUE DATE / TIME: 202508080240
ISSUE DATE / TIME: 202508080655
ISSUE DATE / TIME: 202508080655
ISSUE DATE / TIME: 202508081922
ISSUE DATE / TIME: 202508081922
ISSUE DATE / TIME: 202508080240
ISSUE DATE / TIME: 202508080240
ISSUE DATE / TIME: 202508080240
ISSUE DATE / TIME: 202508080240
ISSUE DATE / TIME: 202508080655
ISSUE DATE / TIME: 202508091347
ISSUE DATE / TIME: 202508100114
Unit Type and Rh: 5100
Unit Type and Rh: 5100
Unit Type and Rh: 6200
Unit Type and Rh: 6200
Unit Type and Rh: 6200
Unit Type and Rh: 6200
Unit Type and Rh: 6200
Unit Type and Rh: 5100
Unit Type and Rh: 5100
Unit Type and Rh: 6200
Unit Type and Rh: 6200
Unit Type and Rh: 6200
Unit Type and Rh: 6200
Unit Type and Rh: 6200
Unit Type and Rh: 6200
Unit Type and Rh: 6200

## 2024-04-01 ENCOUNTER — Ambulatory Visit: Payer: MEDICAID

## 2024-04-09 ENCOUNTER — Telehealth (HOSPITAL_COMMUNITY): Payer: Self-pay | Admitting: *Deleted

## 2024-04-09 NOTE — Telephone Encounter (Signed)
 04/09/2024  Name: Cindy Cruz MRN: [id number] DOB: Feb 11, 1986  Reason for Call:  Transition of Care Hospital Discharge Call  Contact Status: Patient Contact Status: Message (person who answered the phone said he would have patient call back when she returns home)  Language assistant needed:          Follow-Up Questions:    Van Postnatal Depression Scale:  In the Past 7 Days:    PHQ2-9 Depression Scale:     Discharge Follow-up:    Post-discharge interventions: NA  Mliss Sieve, RN 04/09/2024 14:18

## 2024-04-12 ENCOUNTER — Telehealth (HOSPITAL_COMMUNITY): Payer: Self-pay | Admitting: *Deleted

## 2024-04-12 NOTE — Telephone Encounter (Signed)
 04/12/2024  Name: Cindy Cruz MRN: [id number] DOB: Dec 24, 1985  Reason for Call:  Transition of Care Hospital Discharge Call  Contact Status: Patient Contact Status: Message  Patient returned call from this RN after RN's shift and left RN a voice mail on 04/09/2024.  Called patient this morning and left voice mail.  Language assistant needed:          Follow-Up Questions:    Van Postnatal Depression Scale:  In the Past 7 Days:    PHQ2-9 Depression Scale:     Discharge Follow-up:    Post-discharge interventions: NA  Mliss Sieve, RN 04/12/2024 10:30

## 2024-04-23 ENCOUNTER — Ambulatory Visit: Payer: MEDICAID | Admitting: Obstetrics and Gynecology

## 2024-04-29 ENCOUNTER — Ambulatory Visit: Payer: MEDICAID | Admitting: Obstetrics

## 2024-05-17 ENCOUNTER — Ambulatory Visit: Payer: MEDICAID | Admitting: Obstetrics

## 2024-05-17 ENCOUNTER — Encounter: Payer: Self-pay | Admitting: Obstetrics

## 2024-05-17 DIAGNOSIS — I10 Essential (primary) hypertension: Secondary | ICD-10-CM

## 2024-05-17 DIAGNOSIS — D508 Other iron deficiency anemias: Secondary | ICD-10-CM

## 2024-05-17 DIAGNOSIS — F5101 Primary insomnia: Secondary | ICD-10-CM

## 2024-05-17 DIAGNOSIS — R110 Nausea: Secondary | ICD-10-CM

## 2024-05-17 DIAGNOSIS — R52 Pain, unspecified: Secondary | ICD-10-CM

## 2024-05-17 MED ORDER — NIFEDIPINE ER 60 MG PO TB24: 60.0000 mg | ORAL_TABLET | Freq: Two times a day (BID) | ORAL | 2 refills | Status: AC

## 2024-05-17 MED ORDER — IBUPROFEN 800 MG PO TABS: 800.0000 mg | ORAL_TABLET | Freq: Three times a day (TID) | ORAL | 5 refills | Status: AC | PRN

## 2024-05-17 MED ORDER — PROMETHAZINE HCL 25 MG PO TABS: 25.0000 mg | ORAL_TABLET | Freq: Four times a day (QID) | ORAL | 1 refills | Status: AC | PRN

## 2024-05-17 MED ORDER — FT SLEEP AID (DOXYLAMINE) 25 MG PO TABS: 50.0000 mg | ORAL_TABLET | Freq: Every evening | ORAL | 5 refills | Status: AC | PRN

## 2024-05-17 MED ORDER — ACCRUFER 30 MG PO CAPS: 1.0000 | ORAL_CAPSULE | Freq: Two times a day (BID) | ORAL | 3 refills | Status: AC

## 2024-05-17 NOTE — Progress Notes (Unsigned)
 Post Partum Visit Note  Cindy Cruz is a 38 y.o. [phone] female who presents for a postpartum visit. She is 7 weeks postpartum following a normal spontaneous vaginal delivery.  I have fully reviewed the prenatal and intrapartum course. The delivery was at 36 gestational weeks.  Anesthesia: epidural. Postpartum course has been good. Baby is doing well yes. Baby is feeding by bottle - Similac Neosure. Bleeding staining only. Bowel function is normal. Bladder function is normal. Patient is not sexually active. Contraception method is Depo-Provera  injections. Postpartum depression screening: negative.   The pregnancy intention screening data noted above was reviewed. Potential methods of contraception were discussed. The patient elected to proceed with No data recorded.   Edinburgh Postnatal Depression Scale - 05/17/24 1600       Edinburgh Postnatal Depression Scale:  In the Past 7 Days   I have been able to laugh and see the funny side of things. 0    I have looked forward with enjoyment to things. 0    I have blamed myself unnecessarily when things went wrong. 0    I have been anxious or worried for no good reason. 2    I have felt scared or panicky for no good reason. 0    Things have been getting on top of me. 0    I have been so unhappy that I have had difficulty sleeping. 0    I have felt sad or miserable. 0    I have been so unhappy that I have been crying. 0    The thought of harming myself has occurred to me. 0    Edinburgh Postnatal Depression Scale Total 2          Health Maintenance Due  Topic Date Due   Pneumococcal Vaccine (1 of 2 - PCV) Never done   Hepatitis B Vaccines 19-59 Average Risk (1 of 3 - 19+ 3-dose series) Never done   HPV VACCINES (1 - 3-dose SCDM series) Never done   Influenza Vaccine  03/19/2024   COVID-19 Vaccine (1 - 2024-25 season) Never done    {Common ambulatory SmartLinks:19316}  Review of Systems {ros; complete:30496}  Objective:   BP (!) 158/113   Pulse 81   Wt 176 lb 9.6 oz (80.1 kg)   LMP 07/18/2023 (Approximate)   Breastfeeding No   BMI 29.39 kg/m    General:  {gen appearance:16600}   Breasts:  {desc; normal/abnormal/not indicated:14647}  Lungs: {lung exam:16931}  Heart:  {heart exam:5510}  Abdomen: {abdomen exam:16834}   Wound {Wound assessment:11097}  GU exam:  {desc; normal/abnormal/not indicated:14647}       Assessment:    There are no diagnoses linked to this encounter.  *** postpartum exam.   Plan:   Essential components of care per ACOG recommendations:  1.  Mood and well being: Patient with {gen negative/positive:315881} depression screening today. Reviewed local resources for support.  - Patient tobacco use? {tobacco use:25506}  - hx of drug use? {yes/no:25505}    2. Infant care and feeding:  -Patient currently breastmilk feeding? {yes/no:25502}  -Social determinants of health (SDOH) reviewed in EPIC. No concerns***The following needs were identified***  3. Sexuality, contraception and birth spacing - Patient {DOES_DOES WNU:81435} want a pregnancy in the next year.  Desired family size is {NUMBER 1-10:22536} children.  - Reviewed reproductive life planning. Reviewed contraceptive methods based on pt preferences and effectiveness.  Patient desired {Upstream End Methods:24109} today.   - Discussed birth spacing of 18 months  4.  Sleep and fatigue -Encouraged family/partner/community support of 4 hrs of uninterrupted sleep to help with mood and fatigue  5. Physical Recovery  - Discussed patients delivery and complications. She describes her labor as {description:25511} - Patient had a {CHL AMB DELIVERY:[phone]}. Patient had a {laceration:25518} laceration. Perineal healing reviewed. Patient expressed understanding - Patient has urinary incontinence? {yes/no:25515} - Patient {ACTION; IS/IS WNU:78978602} safe to resume physical and sexual activity  6.  Health Maintenance - HM due  items addressed {Yes or If no, why not?:20788} - Last pap smear  Diagnosis  Date Value Ref Range Status  11/04/2023   Final   - Negative for intraepithelial lesion or malignancy (NILM)   Pap smear {done:10129} at today's visit.  -Breast Cancer screening indicated? {indicated:25516}  7. Chronic Disease/Pregnancy Condition follow up: {Follow up:25499}  - PCP follow up  Grayce Shed, [facility] for [facility], [facility]

## 2024-05-17 NOTE — Progress Notes (Unsigned)
 Pt presents for pp. Pt would like something to help with sleep and a refill on her bp medication. Pt has no other questions or concerns at this time.

## 2024-05-18 ENCOUNTER — Ambulatory Visit: Payer: Self-pay | Admitting: Obstetrics

## 2024-05-18 LAB — FERRITIN: Ferritin: 56 ng/mL (ref 15–150)

## 2024-05-18 LAB — CBC
Hematocrit: 36.6 % (ref 34.0–46.6)
Hemoglobin: 11.3 g/dL (ref 11.1–15.9)
MCH: 27.2 pg (ref 26.6–33.0)
MCHC: 30.9 g/dL — ABNORMAL LOW (ref 31.5–35.7)
MCV: 88 fL (ref 79–97)
Platelets: 461 x10E3/uL — ABNORMAL HIGH (ref 150–450)
RBC: 4.16 x10E6/uL (ref 3.77–5.28)
RDW: 15.5 % — ABNORMAL HIGH (ref 11.7–15.4)
WBC: 9.9 x10E3/uL (ref 3.4–10.8)

## 2024-06-14 ENCOUNTER — Ambulatory Visit: Payer: MEDICAID | Admitting: Obstetrics

## 2024-07-26 ENCOUNTER — Ambulatory Visit: Payer: MEDICAID
